# Patient Record
Sex: Female | Born: 1935 | Race: White | Hispanic: No | Smoking: Never smoker
Health system: Southern US, Community
[De-identification: ages and names within clinical notes are randomized; demographics above are authoritative.]

## PROBLEM LIST (undated history)

## (undated) DIAGNOSIS — E559 Vitamin D deficiency, unspecified: Secondary | ICD-10-CM

## (undated) DIAGNOSIS — I4891 Unspecified atrial fibrillation: Secondary | ICD-10-CM

## (undated) DIAGNOSIS — I10 Essential (primary) hypertension: Secondary | ICD-10-CM

## (undated) DIAGNOSIS — M810 Age-related osteoporosis without current pathological fracture: Secondary | ICD-10-CM

## (undated) DIAGNOSIS — H348192 Central retinal vein occlusion, unspecified eye, stable: Secondary | ICD-10-CM

## (undated) DIAGNOSIS — E119 Type 2 diabetes mellitus without complications: Secondary | ICD-10-CM

## (undated) HISTORY — DX: Central retinal vein occlusion, unspecified eye, stable: H34.8192

## (undated) HISTORY — DX: Unspecified atrial fibrillation: I48.91

## (undated) HISTORY — DX: Age-related osteoporosis without current pathological fracture: M81.0

---

## 2001-08-11 ENCOUNTER — Encounter: Admission: RE | Admit: 2001-08-11 | Discharge: 2001-08-11 | Payer: Self-pay | Admitting: Endocrinology

## 2001-08-11 ENCOUNTER — Encounter: Payer: Self-pay | Admitting: Endocrinology

## 2002-09-25 ENCOUNTER — Encounter: Admission: RE | Admit: 2002-09-25 | Discharge: 2002-09-25 | Payer: Self-pay | Admitting: Endocrinology

## 2002-09-25 ENCOUNTER — Encounter: Payer: Self-pay | Admitting: Endocrinology

## 2005-01-16 ENCOUNTER — Encounter: Admission: RE | Admit: 2005-01-16 | Discharge: 2005-01-16 | Payer: Self-pay | Admitting: Endocrinology

## 2005-10-09 ENCOUNTER — Ambulatory Visit: Payer: Self-pay | Admitting: Infectious Diseases

## 2009-12-30 ENCOUNTER — Encounter: Admission: RE | Admit: 2009-12-30 | Discharge: 2009-12-30 | Payer: Self-pay | Admitting: Endocrinology

## 2011-12-29 ENCOUNTER — Other Ambulatory Visit: Payer: Self-pay | Admitting: Endocrinology

## 2011-12-29 DIAGNOSIS — Z1231 Encounter for screening mammogram for malignant neoplasm of breast: Secondary | ICD-10-CM

## 2012-01-20 ENCOUNTER — Ambulatory Visit
Admission: RE | Admit: 2012-01-20 | Discharge: 2012-01-20 | Disposition: A | Discharge status: Home / Self Care | Payer: Medicare Other | Source: Ambulatory Visit | Attending: Endocrinology | Admitting: Endocrinology

## 2012-01-20 ENCOUNTER — Ambulatory Visit: Payer: Self-pay

## 2012-01-20 DIAGNOSIS — Z1231 Encounter for screening mammogram for malignant neoplasm of breast: Secondary | ICD-10-CM

## 2012-01-21 ENCOUNTER — Other Ambulatory Visit: Payer: Self-pay | Admitting: Endocrinology

## 2012-01-21 DIAGNOSIS — R928 Other abnormal and inconclusive findings on diagnostic imaging of breast: Secondary | ICD-10-CM

## 2012-01-29 ENCOUNTER — Ambulatory Visit
Admission: RE | Admit: 2012-01-29 | Discharge: 2012-01-29 | Disposition: A | Discharge status: Home / Self Care | Payer: Medicare Other | Source: Ambulatory Visit | Attending: Endocrinology | Admitting: Endocrinology

## 2012-01-29 DIAGNOSIS — R928 Other abnormal and inconclusive findings on diagnostic imaging of breast: Secondary | ICD-10-CM

## 2012-11-08 ENCOUNTER — Other Ambulatory Visit: Payer: Self-pay | Admitting: *Deleted

## 2012-11-08 ENCOUNTER — Encounter: Payer: Self-pay | Admitting: Endocrinology

## 2012-11-08 ENCOUNTER — Ambulatory Visit (INDEPENDENT_AMBULATORY_CARE_PROVIDER_SITE_OTHER): Payer: Medicare Other | Admitting: Endocrinology

## 2012-11-08 VITALS — BP 130/84 | HR 86 | Resp 12 | Ht 67.0 in | Wt 210.8 lb

## 2012-11-08 DIAGNOSIS — M1990 Unspecified osteoarthritis, unspecified site: Secondary | ICD-10-CM

## 2012-11-08 DIAGNOSIS — E559 Vitamin D deficiency, unspecified: Secondary | ICD-10-CM

## 2012-11-08 DIAGNOSIS — I10 Essential (primary) hypertension: Secondary | ICD-10-CM

## 2012-11-08 DIAGNOSIS — E119 Type 2 diabetes mellitus without complications: Secondary | ICD-10-CM

## 2012-11-08 DIAGNOSIS — I4891 Unspecified atrial fibrillation: Secondary | ICD-10-CM | POA: Insufficient documentation

## 2012-11-08 DIAGNOSIS — M810 Age-related osteoporosis without current pathological fracture: Secondary | ICD-10-CM

## 2012-11-08 DIAGNOSIS — E7800 Pure hypercholesterolemia, unspecified: Secondary | ICD-10-CM

## 2012-11-08 LAB — URINALYSIS, ROUTINE W REFLEX MICROSCOPIC

## 2012-11-08 LAB — COMPREHENSIVE METABOLIC PANEL
Alkaline Phosphatase: 45 U/L (ref 39–117)
BUN: 19 mg/dL (ref 6–23)
CO2: 29 mEq/L (ref 19–32)
Creatinine, Ser: 1.3 mg/dL — ABNORMAL HIGH (ref 0.4–1.2)
GFR: 43.34 mL/min — ABNORMAL LOW (ref >60.00)
Glucose, Bld: 200 mg/dL — ABNORMAL HIGH (ref 70–99)
Sodium: 137 mEq/L (ref 135–145)
Total Bilirubin: 0.7 mg/dL (ref 0.3–1.2)
Total Protein: 7.2 g/dL (ref 6.0–8.3)

## 2012-11-08 LAB — LIPID PANEL
Cholesterol: 222 mg/dL — ABNORMAL HIGH (ref 0–200)
Triglycerides: 226 mg/dL — ABNORMAL HIGH (ref 0.0–149.0)
VLDL: 45.2 mg/dL — ABNORMAL HIGH (ref 0.0–40.0)

## 2012-11-08 LAB — MICROALBUMIN / CREATININE URINE RATIO
Creatinine,U: 102 mg/dL
Microalb Creat Ratio: 1.9 mg/g (ref 0.0–30.0)
Microalb, Ur: 1.9 mg/dL (ref 0.0–1.9)

## 2012-11-08 MED ORDER — ATORVASTATIN CALCIUM 10 MG PO TABS: 10.0000 mg | ORAL_TABLET | Freq: Every day | ORAL | Status: DC

## 2012-11-08 MED ORDER — SITAGLIPTIN PHOS-METFORMIN HCL 50-1000 MG PO TABS: 1.0000 | ORAL_TABLET | Freq: Two times a day (BID) | ORAL | Status: DC

## 2012-11-08 MED ORDER — BISOPROLOL-HYDROCHLOROTHIAZIDE 10-6.25 MG PO TABS: 1.0000 | ORAL_TABLET | Freq: Every day | ORAL | Status: DC

## 2012-11-08 MED ORDER — GLUCOSE BLOOD VI STRP: ORAL_STRIP | Status: DC

## 2012-11-08 NOTE — Progress Notes (Signed)
Reason for Appointment: Diabetes follow-up   History of Present Illness   Diagnosis: Type 2 DIABETES MELITUS, date of diagnosis: 2002  The patient is seen today in follow up of Type 2 diabetes. This has been usually mild and well-controlled especially for her age.  She has however not been seen in followup since last December as she did not reschedule your canceled appointment in March  Her A1c was relatively increased on the last visit even with taking full dose Janumet and Amaryl. However she has not been monitoring her blood sugar at home lately and does not know what it is She did not feel tired or have any increased thirst or urination She said that she has been out of her Janumet although has been taking her Amaryl as directed The last HbgA1c was 7.2   Difficulties with medication adherence: Running out of medication .  Monitors blood glucose: Less than once a day.  Glucometer: Accucheck.  Blood Glucose readings: Unknown  Hypoglycemia frequency: Never.  Food preferences: Avoiding sweets, cereal and large portions, egg in am.   Physical activity: at home: walking 10 minutes up to 4 days a week  The microalbumin has been tested , and the result is normal.  Diabetes complications: None  Hypertension:   Here for hypertension F/U doing well and without complaints . She is on  10 mg Ziac and 5 mg lisinopril  Did have a higher creatinine with 10 mg lisinopril    Medication List       This list is accurate as of: 11/08/12 11:59 PM.  Always use your most recent med list.               ACCU-CHEK FASTCLIX LANCETS Misc  by Does not apply route. Check blood sugars once a day     atorvastatin 10 MG tablet  Commonly known as:  LIPITOR  Take 1 tablet (10 mg total) by mouth daily.     bisoprolol-hydrochlorothiazide 10-6.25 MG per tablet  Commonly known as:  ZIAC  Take 1 tablet by mouth daily.     glimepiride 2 MG tablet  Commonly known as:  AMARYL  Take 2 mg by mouth daily  before breakfast.     glucose blood test strip  Commonly known as:  ACCU-CHEK SMARTVIEW  Use as instructed     lisinopril 5 MG tablet  Commonly known as:  PRINIVIL,ZESTRIL  Take 5 mg by mouth daily.     raloxifene 60 MG tablet  Commonly known as:  EVISTA  Take 60 mg by mouth daily.     sitaGLIPtan-metformin 50-1000 MG per tablet  Commonly known as:  JANUMET  Take 1 tablet by mouth 2 (two) times daily with a meal.     Vitamin D (Ergocalciferol) 50000 UNITS Caps  Commonly known as:  DRISDOL  Take 50,000 Units by mouth every 7 (seven) days.     warfarin 5 MG tablet  Commonly known as:  COUMADIN  Take 5 mg by mouth daily.        Allergies: No Known Allergies  Past Medical History  Diagnosis Date  . Retinal vein occlusion   . Atrial fibrillation   . Osteoporosis     No past surgical history on file.  Family History  Problem Relation Age of Onset  . Heart disease Brother     Social History:  reports that she has never smoked. She does not have any smokeless tobacco history on file. Her alcohol and drug histories are not  on file.  Review of Systems - Cardiovascular ROS: positive for - atrial fibrillation and hypertension  The patient has a history of Osteoporosis and her last T score at the femoral neck was -3.1 in 9/11 compared to 2.5 in 2006. T score of the spine was -2.5 compared to previous one of 3.3. She had been on bisphosphonates including 2 doses of Reclast infusion. May have had transient increase in creatinine with last Reclast and this was stopped. Was started on Evista in 12/12 and she is tolerating this well.   Examination:   BP 130/84  Pulse 86  Resp 12  Ht 5\' 7"  (1.702 m)  Wt 210 lb 12.8 oz (95.618 kg)  BMI 33.01 kg/m2  SpO2 99%  Body mass index is 33.01 kg/(m^2).   Heart rate 80 appears regular and heart sounds are normal Lungs clear No pedal edema  Assesment:   1. Diabetes type 2, uncontrolled - 250.02  The patient's diabetes will need  to be assessed by labs today Reminded her to check her blood sugars again to help monitor her blood sugars She will need to refill all her medications and be more compliant with these and also followup  2. Long-standing atrial fibrillation: Rate is controlled. She will need to be set up with the Coumadin clinic for regular followup. To have EKG on the next visit  3. Blood pressure appears to be well controlled on second measurement  4. History of mild CKD: This will need to be followed  5. History of osteoporosis: She will continue Evista  6. History of hypercholesterolemia, need to make sure she is compliant with her medication and check lab work  7. History of vitamin D deficiency: She will continue supplements and will have vitamin D level done when Medicare allows this, currently not permissible  Susan Potter 11/11/2012, 9:33 AM   Office Visit on 11/08/2012  Component Date Value Range Status  . Color, Urine 11/08/2012 LT. YELLOW  Yellow;Lt. Yellow Final  . APPearance 11/08/2012 CLEAR  Clear Final  . Specific Gravity, Urine 11/08/2012 1.025  1.000-1.030 Final  . pH 11/08/2012 6.0  5.0 - 8.0 Final  . Total Protein, Urine 11/08/2012 NEGATIVE  Negative Final  . Urine Glucose 11/08/2012 NEGATIVE  Negative Final  . Ketones, ur 11/08/2012 NEGATIVE  Negative Final  . Bilirubin Urine 11/08/2012 NEGATIVE  Negative Final  . Hgb urine dipstick 11/08/2012 TRACE-INTACT  Negative Final  . Urobilinogen, UA 11/08/2012 0.2  0.0 - 1.0 Final  . Leukocytes, UA 11/08/2012 NEGATIVE  Negative Final  . Nitrite 11/08/2012 NEGATIVE  Negative Final  . WBC, UA 11/08/2012 3-6/hpf  0-2/hpf Final  . RBC / HPF 11/08/2012 0-2/hpf  0-2/hpf Final  . Squamous Epithelial / LPF 11/08/2012 Few(5-10/hpf)  Rare(0-4/hpf) Final  . Bacteria, UA 11/08/2012 Rare(<10/hpf)  None Final  . Microalb, Ur 11/08/2012 1.9  0.0 - 1.9 mg/dL Final  . Creatinine,U 40/98/1191 102.0   Final  . Microalb Creat Ratio 11/08/2012 1.9  0.0 -  30.0 mg/g Final  . Hemoglobin A1C 11/08/2012 9.3* 4.6 - 6.5 % Final   Glycemic Control Guidelines for People with Diabetes:Non Diabetic:  <6%Goal of Therapy: <7%Additional Action Suggested:  >8%   . TSH 11/08/2012 1.60  0.35 - 5.50 uIU/mL Final  . Sodium 11/08/2012 137  135 - 145 mEq/L Final  . Potassium 11/08/2012 4.1  3.5 - 5.1 mEq/L Final  . Chloride 11/08/2012 99  96 - 112 mEq/L Final  . CO2 11/08/2012 29  19 - 32 mEq/L  Final  . Glucose, Bld 11/08/2012 200* 70 - 99 mg/dL Final  . BUN 78/29/5621 19  6 - 23 mg/dL Final  . Creatinine, Ser 11/08/2012 1.3* 0.4 - 1.2 mg/dL Final  . Total Bilirubin 11/08/2012 0.7  0.3 - 1.2 mg/dL Final  . Alkaline Phosphatase 11/08/2012 45  39 - 117 U/L Final  . AST 11/08/2012 15  0 - 37 U/L Final  . ALT 11/08/2012 9  0 - 35 U/L Final  . Total Protein 11/08/2012 7.2  6.0 - 8.3 g/dL Final  . Albumin 30/86/5784 3.6  3.5 - 5.2 g/dL Final  . Calcium 69/62/9528 9.4  8.4 - 10.5 mg/dL Final  . GFR 41/32/4401 43.34* >60.00 mL/min Final  . Cholesterol 11/08/2012 222* 0 - 200 mg/dL Final   ATP III Classification       Desirable:  < 200 mg/dL               Borderline High:  200 - 239 mg/dL          High:  > = 027 mg/dL  . Triglycerides 11/08/2012 226.0* 0.0 - 149.0 mg/dL Final   Normal:  <253 mg/dLBorderline High:  150 - 199 mg/dL  . HDL 11/08/2012 42.30  >39.00 mg/dL Final  . VLDL 66/44/0347 45.2* 0.0 - 40.0 mg/dL Final  . Total CHOL/HDL Ratio 11/08/2012 5   Final                  Men          Women1/2 Average Risk     3.4          3.3Average Risk          5.0          4.42X Average Risk          9.6          7.13X Average Risk          15.0          11.0                      . Direct LDL 11/08/2012 148.0   Final   Optimal:  <100 mg/dLNear or Above Optimal:  100-129 mg/dLBorderline High:  130-159 mg/dLHigh:  160-189 mg/dLVery High:  >190 mg/dL

## 2012-11-08 NOTE — Patient Instructions (Signed)
Restart glucose monitoring at least 3 times a week either before breakfast or 2-3 hours after any meal  Walk 10-15 minutes at least every other day  Please schedule appointment for prothrombin time lab work at the main office on [facility].

## 2012-11-10 ENCOUNTER — Telehealth: Payer: Self-pay | Admitting: *Deleted

## 2012-11-10 NOTE — Telephone Encounter (Signed)
Pt states she had ran out of the Janumet, a new order was sent in on the 22nd, she is aware of needing an appt in one month.

## 2012-11-10 NOTE — Telephone Encounter (Signed)
Please remind her to check her sugar at least once a day and bring monitor in one month

## 2012-11-10 NOTE — Telephone Encounter (Signed)
She was reminded of both

## 2012-11-11 ENCOUNTER — Encounter: Payer: Self-pay | Admitting: Endocrinology

## 2012-11-11 ENCOUNTER — Other Ambulatory Visit: Payer: Self-pay | Admitting: *Deleted

## 2012-11-11 MED ORDER — ONETOUCH ULTRA SYSTEM W/DEVICE KIT: PACK | Status: AC

## 2012-11-11 MED ORDER — GLUCOSE BLOOD VI STRP: ORAL_STRIP | Status: DC

## 2012-11-17 ENCOUNTER — Telehealth: Payer: Self-pay | Admitting: *Deleted

## 2012-11-17 NOTE — Telephone Encounter (Signed)
Noted pt is aware, she is taking medication as you wanted

## 2012-11-17 NOTE — Telephone Encounter (Signed)
Message copied by Hermenia Bers on Thu Nov 17, 2012 11:58 AM ------      Message from: [name]      Created: Wed Nov 09, 2012 11:50 AM       Her blood sugars are running very high, was 200 on the lab work, needs to check blood sugars at least once a day at different times.      Confirm that she is taking her Janumet twice a day. Need to have her take glimepiride 2 mg twice a day and see me back in one month ------

## 2012-12-16 ENCOUNTER — Telehealth: Payer: Self-pay | Admitting: Endocrinology

## 2012-12-16 MED ORDER — SITAGLIPTIN PHOS-METFORMIN HCL 50-1000 MG PO TABS: 1.0000 | ORAL_TABLET | Freq: Two times a day (BID) | ORAL | Status: DC

## 2012-12-16 NOTE — Telephone Encounter (Signed)
rx sent for 60 tablets, 30 day supply

## 2012-12-21 ENCOUNTER — Other Ambulatory Visit: Payer: Self-pay | Admitting: *Deleted

## 2012-12-21 MED ORDER — LISINOPRIL 5 MG PO TABS: 5.0000 mg | ORAL_TABLET | Freq: Every day | ORAL | Status: DC

## 2012-12-29 ENCOUNTER — Other Ambulatory Visit: Payer: Self-pay | Admitting: *Deleted

## 2012-12-29 MED ORDER — WARFARIN SODIUM 5 MG PO TABS: 5.0000 mg | ORAL_TABLET | Freq: Every day | ORAL | Status: DC

## 2012-12-31 ENCOUNTER — Emergency Department (HOSPITAL_COMMUNITY): Payer: Medicare Other

## 2012-12-31 ENCOUNTER — Emergency Department (HOSPITAL_COMMUNITY)
Admission: EM | Admit: 2012-12-31 | Discharge: 2012-12-31 | Disposition: A | Discharge status: Home / Self Care | Payer: Medicare Other | Attending: Emergency Medicine | Admitting: Emergency Medicine

## 2012-12-31 ENCOUNTER — Encounter (HOSPITAL_COMMUNITY): Payer: Self-pay | Admitting: Emergency Medicine

## 2012-12-31 DIAGNOSIS — S0180XA Unspecified open wound of other part of head, initial encounter: Secondary | ICD-10-CM | POA: Insufficient documentation

## 2012-12-31 DIAGNOSIS — Z7901 Long term (current) use of anticoagulants: Secondary | ICD-10-CM | POA: Insufficient documentation

## 2012-12-31 DIAGNOSIS — Y9389 Activity, other specified: Secondary | ICD-10-CM | POA: Insufficient documentation

## 2012-12-31 DIAGNOSIS — I10 Essential (primary) hypertension: Secondary | ICD-10-CM | POA: Insufficient documentation

## 2012-12-31 DIAGNOSIS — I4891 Unspecified atrial fibrillation: Secondary | ICD-10-CM | POA: Insufficient documentation

## 2012-12-31 DIAGNOSIS — E119 Type 2 diabetes mellitus without complications: Secondary | ICD-10-CM | POA: Insufficient documentation

## 2012-12-31 DIAGNOSIS — Y929 Unspecified place or not applicable: Secondary | ICD-10-CM | POA: Insufficient documentation

## 2012-12-31 DIAGNOSIS — M810 Age-related osteoporosis without current pathological fracture: Secondary | ICD-10-CM | POA: Insufficient documentation

## 2012-12-31 DIAGNOSIS — S42302A Unspecified fracture of shaft of humerus, left arm, initial encounter for closed fracture: Secondary | ICD-10-CM

## 2012-12-31 DIAGNOSIS — Z79899 Other long term (current) drug therapy: Secondary | ICD-10-CM | POA: Insufficient documentation

## 2012-12-31 DIAGNOSIS — Z8669 Personal history of other diseases of the nervous system and sense organs: Secondary | ICD-10-CM | POA: Insufficient documentation

## 2012-12-31 DIAGNOSIS — S42213A Unspecified displaced fracture of surgical neck of unspecified humerus, initial encounter for closed fracture: Secondary | ICD-10-CM | POA: Insufficient documentation

## 2012-12-31 DIAGNOSIS — W010XXA Fall on same level from slipping, tripping and stumbling without subsequent striking against object, initial encounter: Secondary | ICD-10-CM | POA: Insufficient documentation

## 2012-12-31 DIAGNOSIS — W01119A Fall on same level from slipping, tripping and stumbling with subsequent striking against unspecified sharp object, initial encounter: Secondary | ICD-10-CM | POA: Insufficient documentation

## 2012-12-31 HISTORY — DX: Type 2 diabetes mellitus without complications: E11.9

## 2012-12-31 HISTORY — DX: Essential (primary) hypertension: I10

## 2012-12-31 MED ORDER — OXYCODONE-ACETAMINOPHEN 5-325 MG PO TABS: 2.0000 | ORAL_TABLET | ORAL | Status: DC | PRN

## 2012-12-31 MED ORDER — HYDROCODONE-ACETAMINOPHEN 5-325 MG PO TABS
2.0000 | ORAL_TABLET | Freq: Once | ORAL | Status: AC
  Administered 2012-12-31: 2 via ORAL
  Filled 2012-12-31: qty 2

## 2012-12-31 MED ORDER — POLYETHYLENE GLYCOL 3350 17 GM/SCOOP PO POWD: 17.0000 g | Freq: Every day | ORAL | Status: DC

## 2012-12-31 MED ORDER — MORPHINE SULFATE 4 MG/ML IJ SOLN
4.0000 mg | Freq: Once | INTRAMUSCULAR | Status: AC
  Administered 2012-12-31: 4 mg via INTRAMUSCULAR
  Filled 2012-12-31: qty 1

## 2012-12-31 NOTE — ED Provider Notes (Signed)
CSN: [id number]     Arrival date & time 12/31/12  1907 History   First MD Initiated Contact with Patient 12/31/12 1933     Chief Complaint  Patient presents with  . Fall   (Consider location/radiation/quality/duration/timing/severity/associated sxs/prior Treatment) HPI Comments: Pt missed a step down, fell onto left side and injured left shoulder upon impact. Has a small cut on left eyebrow and reports was due to glasses.  Insists she did not hit her head, no neck pain, no LOC, no HA.  She has no back pain, left hip pain, hurts shoulder to move far.  Pt is right handed. Pt is on coumadin.  No sig bleeding.  No abd pain, flank pain.  No meds taken PTA.    Patient is a 79 y.o. female presenting with shoulder injury. The history is provided by the patient and a relative.  Shoulder Injury This is a new problem. The current episode started 1 to 2 hours ago. Pertinent negatives include no abdominal pain and no headaches. The symptoms are aggravated by twisting. The symptoms are relieved by rest. She has tried nothing for the symptoms. The treatment provided no relief.    Past Medical History  Diagnosis Date  . Retinal vein occlusion   . Atrial fibrillation   . Osteoporosis   . Hypertension   . Diabetes mellitus without complication    History reviewed. No pertinent past surgical history. Family History  Problem Relation Age of Onset  . Heart disease Brother    History  Substance Use Topics  . Smoking status: Never Smoker   . Smokeless tobacco: Not on file  . Alcohol Use: No   OB History   Grav Para Term Preterm Abortions TAB SAB Ect Mult Living                 Review of Systems  Constitutional: Negative for diaphoresis.  HENT: Negative for neck pain.   Gastrointestinal: Negative for nausea, vomiting and abdominal pain.  Musculoskeletal: Positive for arthralgias. Negative for back pain.  Skin: Positive for wound.  Neurological: Negative for dizziness, syncope, light-headedness  and headaches.    Allergies  Review of patient's allergies indicates no known allergies.  Home Medications   Current Outpatient Rx  Name  Route  Sig  Dispense  Refill  . bisoprolol-hydrochlorothiazide (ZIAC) 10-6.25 MG per tablet   Oral   Take 1 tablet by mouth daily.   30 tablet   5   . glimepiride (AMARYL) 2 MG tablet   Oral   Take 1 mg by mouth at bedtime.          Marland Kitchen lisinopril (PRINIVIL,ZESTRIL) 5 MG tablet   Oral   Take 1 tablet (5 mg total) by mouth daily.   30 tablet   6   . OVER THE COUNTER MEDICATION   Both Eyes   Place 1 drop into both eyes daily as needed (dry eyes/itching).         . raloxifene (EVISTA) 60 MG tablet   Oral   Take 60 mg by mouth daily.         . sitaGLIPtin-metformin (JANUMET) 50-1000 MG per tablet   Oral   Take 1 tablet by mouth 2 (two) times daily with a meal.   60 tablet   5   . Vitamin D, Ergocalciferol, (DRISDOL) 50000 UNITS CAPS   Oral   Take 50,000 Units by mouth every 7 (seven) days. On Sundays         . warfarin (  COUMADIN) 5 MG tablet   Oral   Take 1 tablet (5 mg total) by mouth daily.   30 tablet   5   . ACCU-CHEK FASTCLIX LANCETS MISC   Does not apply   by Does not apply route. Check blood sugars once a day         . Blood Glucose Monitoring Suppl (ONE TOUCH ULTRA SYSTEM KIT) W/DEVICE KIT      One touch Ultra 2 Glucometer   1 each   0   . glucose blood (ACCU-CHEK SMARTVIEW) test strip      Use as instructed   100 each   5   . glucose blood test strip      One touch Ultra 2 test strips only   100 each   12   . oxyCODONE-acetaminophen (PERCOCET) 5-325 MG per tablet   Oral   Take 2 tablets by mouth every 4 (four) hours as needed for pain.   20 tablet   0   . polyethylene glycol powder (GLYCOLAX/MIRALAX) powder   Oral   Take 17 g by mouth daily.   255 g   0    BP 122/74  Pulse 78  Temp(Src) 97.4 F (36.3 C) (Oral)  Resp 16  SpO2 97% Physical Exam  Nursing note and vitals  reviewed. Constitutional: She is oriented to person, place, and time. She appears well-developed and well-nourished. No distress.  HENT:  Head: Normocephalic.  Eyes: Conjunctivae and EOM are normal. Pupils are equal, round, and reactive to light.    Neck: Trachea normal, normal range of motion and full passive range of motion without pain. Neck supple. No spinous process tenderness and no muscular tenderness present. No tracheal deviation and normal range of motion present. No Brudzinski's sign and no Kernig's sign noted.  Cardiovascular: Normal rate and intact distal pulses.   Pulmonary/Chest: Effort normal and breath sounds normal. No stridor. No respiratory distress.  Abdominal: Soft. She exhibits no distension. There is no tenderness. There is no rebound.  Musculoskeletal:       Left shoulder: She exhibits decreased range of motion, tenderness, crepitus and pain. She exhibits normal pulse and normal strength.       Left elbow: She exhibits no deformity. No tenderness found.       Left wrist: She exhibits normal range of motion and no tenderness.       Right hip: She exhibits no tenderness.       Left hip: She exhibits no tenderness.       Cervical back: She exhibits no tenderness.       Thoracic back: She exhibits no tenderness.  Neurological: She is alert and oriented to person, place, and time. She exhibits normal muscle tone. Coordination normal.  Skin: Skin is warm. She is not diaphoretic.  Psychiatric: She has a normal mood and affect.    ED Course  Procedures (including critical care time) Labs Review Labs Reviewed - No data to display Imaging Review No results found.  ra sat is 97% and I interpret to be normal  8:00 PM Pt is somewhat more comfortable after PO pain meds.  Plain films pending.  If fracture present, it is closed, likely amenable for splint and sling, referral to orthopedist for close outpt follow up and Rx for pain medication and return instructions.     MDM   1. Humerus fracture, left, closed, initial encounter      Pt with mechanical fall.  Pt offered head CT given  small wound to left foreahead, she insists she didn't hit head, no HA.  Pt with left shoulder crepitus, pain.  Normal distal color, pulses, sensation of left UE.  Will get plain films of left shoulder, PO vicodin for pain, ice.      Gavin Pound. Oletta Lamas, MD 01/02/13 2234

## 2012-12-31 NOTE — ED Notes (Signed)
Pt. missed her step and tripped this evening , fell forward ,no LOC / ambulatory reports pain at left shoulder and abrasion at left eyebrow , alert and oriented , respirations unlabored.

## 2012-12-31 NOTE — ED Provider Notes (Signed)
Susan Potter S 8:00 PM patient discussed in sign out with Dr. Oletta Lamas.  The patient is an 79 year old woman with history of A. fib on Coumadin who had a mechanical fall injuring her left arm and shoulder area. She had a small superficial laceration above her left eyebrow from her glasses. No significant head trauma. She has refused CT of the head however has normal nonfocal neuro exam and good mentation. X-rays of left shoulder and humerus pending.   8:45PM X-rays reviewed and demonstrate a mildly displaced left humeral neck fracture. I discussed the case and reviewed the x-rays with Dr. Ranell Patrick on call for orthopedics. Recommend placing the patient in a sling immobilizer and having her followup on Monday in the office. We'll give prescriptions for Percocet for pain as well as stool softener to prevent constipation.  Patient and family notified of findings and treatment plan. They are in agreement and will followup. Patient was given a strict return precautions.  Angus Seller, PA-C 12/31/12 2110

## 2012-12-31 NOTE — Progress Notes (Signed)
Orthopedic Tech Progress Note Patient Details:  Susan Potter Oct 27, 1926 [id number]  Ortho Devices Type of Ortho Device: Sling immobilizer Ortho Device/Splint Location: lue Ortho Device/Splint Interventions: Application   Kenzo Ozment 12/31/2012, 9:03 PM

## 2013-01-01 NOTE — ED Provider Notes (Signed)
Medical screening examination/treatment/procedure(s) were performed by non-physician practitioner and as supervising physician I was immediately available for consultation/collaboration.   Candyce Churn, MD 01/01/13 1105

## 2013-01-03 ENCOUNTER — Telehealth: Payer: Self-pay | Admitting: *Deleted

## 2013-01-03 MED ORDER — OXYCODONE-ACETAMINOPHEN 5-325 MG PO TABS: 2.0000 | ORAL_TABLET | ORAL | Status: DC | PRN

## 2013-01-03 NOTE — Telephone Encounter (Signed)
We can drink a prescription for 25 tablets of Percocet 5/325 Also needs to be scheduled for Coumadin monitoring

## 2013-01-03 NOTE — Telephone Encounter (Signed)
Pt fell on Saturday and broke her arm, the ER doctor gave her percocet, but she has ran out and the orthopedic Dr. Gunnar Bulla call her in any because she hasn't been seen, Daughter [name]) is wanting to see if you will okay it to be called in?

## 2013-02-07 ENCOUNTER — Telehealth: Payer: Self-pay | Admitting: Endocrinology

## 2013-02-07 NOTE — Telephone Encounter (Signed)
Does pt need to r/s follow up or are same day labs OK?  / Sherri

## 2013-02-07 NOTE — Telephone Encounter (Signed)
Same day labs are okay

## 2013-02-07 NOTE — Telephone Encounter (Signed)
Pt has appt tomorrow. Looks like last visit was in July 2014. It was not requested in July to schedule a lab appt but it does look like patient has future labs on order from Dr. Lucianne Muss. Does pt need labs prior to her follow up appt w/ Dr. Lucianne Muss. She is currently on his schedule for tomorrow morning. Please advise-Sherri

## 2013-02-07 NOTE — Telephone Encounter (Signed)
Yes patient will need labs, thanks

## 2013-02-08 ENCOUNTER — Encounter: Payer: Self-pay | Admitting: Endocrinology

## 2013-02-08 ENCOUNTER — Other Ambulatory Visit (INDEPENDENT_AMBULATORY_CARE_PROVIDER_SITE_OTHER): Payer: Medicare Other

## 2013-02-08 ENCOUNTER — Ambulatory Visit (INDEPENDENT_AMBULATORY_CARE_PROVIDER_SITE_OTHER): Payer: Medicare Other | Admitting: Endocrinology

## 2013-02-08 VITALS — BP 118/74 | HR 85 | Temp 98.6°F | Resp 12 | Ht 67.0 in | Wt 208.3 lb

## 2013-02-08 DIAGNOSIS — Z7901 Long term (current) use of anticoagulants: Secondary | ICD-10-CM

## 2013-02-08 DIAGNOSIS — I4891 Unspecified atrial fibrillation: Secondary | ICD-10-CM

## 2013-02-08 DIAGNOSIS — E119 Type 2 diabetes mellitus without complications: Secondary | ICD-10-CM

## 2013-02-08 DIAGNOSIS — E7800 Pure hypercholesterolemia, unspecified: Secondary | ICD-10-CM

## 2013-02-08 DIAGNOSIS — I10 Essential (primary) hypertension: Secondary | ICD-10-CM

## 2013-02-08 LAB — BASIC METABOLIC PANEL
Chloride: 103 mEq/L (ref 96–112)
Potassium: 4.6 mEq/L (ref 3.5–5.1)

## 2013-02-08 LAB — PROTIME-INR: Prothrombin Time: 45.5 s — ABNORMAL HIGH (ref 10.2–12.4)

## 2013-02-08 LAB — LIPID PANEL
Cholesterol: 124 mg/dL (ref 0–200)
HDL: 49.9 mg/dL (ref >39.00)
VLDL: 24 mg/dL (ref 0.0–40.0)

## 2013-02-08 LAB — HEMOGLOBIN A1C: Hgb A1c MFr Bld: 7.1 % — ABNORMAL HIGH (ref 4.6–6.5)

## 2013-02-08 NOTE — Progress Notes (Signed)
Susan Potter  Reason for Appointment: Diabetes follow-up   History of Present Illness   Diagnosis: Type 2 DIABETES MELITUS, date of diagnosis: 2002  She is here for follow up of Type 2 diabetes. This has been usually mild and well-controlled especially for her age.  A1c last December was 7.2 She was however having rather poor control of her last visit because of running out of her Janumet Also had been quite noncompliant with checking her blood sugar Recently also she has not checked her blood sugar much at all and does not appear motivated to do so  Monitors blood glucose very rarely  Glucometer: Accucheck.  Blood Glucose readings: Previously <150 Hypoglycemia frequency: Never.  Food preferences: Avoiding sweets, cereal and large portions, egg in am. Side effects from medications: None   Physical activity: at home: walking 10 minutes up to 4 days a week  The microalbumin has been previously normal Diabetes complications: None  Hypertension:    Her blood pressure is excellent now. She is on 10 mg Ziac and 5 mg lisinopril  Did have a higher creatinine with 10 mg lisinopril  ANTICOAGULATION: For some reason she has not been followed in the Coumadin clinic and has not had her prothrombin time checked in several months. She continues to take Coumadin 5 mg daily. She has had long-standing atrial fibrillation without symptoms  HYPERLIPIDEMIA: On her last visit she had run out of her Lipitor and LDL was 148, she thinks she has been back on it now     Medication List       This list is accurate as of: 02/08/13 11:59 PM.  Always use your most recent med list.               ACCU-CHEK FASTCLIX LANCETS Misc  by Does not apply route. Check blood sugars once a day     bisoprolol-hydrochlorothiazide 10-6.25 MG per tablet  Commonly known as:  ZIAC  Take 1 tablet by mouth daily.     glimepiride 2 MG tablet  Commonly known as:  AMARYL  Take 1 mg by mouth at bedtime.     glucose blood test strip  Commonly known as:  ACCU-CHEK SMARTVIEW  Use as instructed     glucose blood test strip  One touch Ultra 2 test strips only     lisinopril 5 MG tablet  Commonly known as:  PRINIVIL,ZESTRIL  Take 1 tablet (5 mg total) by mouth daily.     ONE TOUCH ULTRA SYSTEM KIT W/DEVICE Kit  One touch Ultra 2 Glucometer     OVER THE COUNTER MEDICATION  Place 1 drop into both eyes daily as needed (dry eyes/itching).     polyethylene glycol powder powder  Commonly known as:  GLYCOLAX/MIRALAX  Take 17 g by mouth daily.     raloxifene 60 MG tablet  Commonly known as:  EVISTA  Take 60 mg by mouth daily.     sitaGLIPtin-metformin 50-1000 MG per tablet  Commonly known as:  JANUMET  Take 1 tablet by mouth 2 (two) times daily with a meal.     Vitamin D (Ergocalciferol) 50000 UNITS Caps capsule  Commonly known as:  DRISDOL  Take 50,000 Units by mouth every 7 (seven) days. On Sundays     warfarin 5 MG tablet  Commonly known as:  COUMADIN  Take 1 tablet (5 mg total) by mouth daily.        Allergies: No Known Allergies  Past Medical History  Diagnosis Date  .  Retinal vein occlusion   . Atrial fibrillation   . Osteoporosis   . Hypertension   . Diabetes mellitus without complication     No past surgical history on file.  Family History  Problem Relation Age of Onset  . Heart disease Brother     Social History:  reports that she has never smoked. She does not have any smokeless tobacco history on file. She reports that she does not drink alcohol. Her drug history is not on file.  Review of Systems - Cardiovascular ROS: positive for - atrial fibrillation and hypertension  Osteoporosis: her last T score at the femoral neck was -3.1 in 9/11 compared to 2.5 in 2006. T score of the spine was -2.5 compared to previous one of 3.3. She had been on bisphosphonates including 2 doses of Reclast infusion. May have had transient increase in creatinine with last Reclast and  this was stopped. Was started on Evista in 12/12 and she is tolerating this well. She did have left arm fracture recently but this was from a significant fall, still wearing a sling  No history of thyroid disease  She does not think she has forgetfulness  No history of numbness or tingling in her feet, no history of balance difficulty   Examination:   BP 118/74  Pulse 85  Temp(Src) 98.6 F (37 C)  Resp 12  Ht 5\' 7"  (1.702 m)  Wt 208 lb 4.8 oz (94.484 kg)  BMI 32.62 kg/m2  SpO2 94%  Body mass index is 32.62 kg/(m^2).   Heart rate 76 appears regular and heart sounds are normal No pedal edema  Assesment:   1. Diabetes type 2, uncontrolled - 250.02  The patient's diabetes will need to be assessed by labs today Reminded her to check her blood sugars regularly to help monitor her blood sugars  2. Long-standing atrial fibrillation: Rate is controlled. She will need to be set up with the Coumadin clinic for regular followup. To have EKG on the next visit  3. Blood pressure appears to be well controlled and she will continue same medications  4. History of mild CKD: This will need to be followed  5. History of osteoporosis: to continue Evista, consider followup bone density  6. History of hypercholesterolemia, followup lipids to be done  7. History of vitamin D deficiency: She will continue supplements and will have vitamin D level done annually as allowed by Medicare   [facility] 02/09/2013, 12:08 PM   Appointment on 02/08/2013  Component Date Value Range Status  . Sodium 02/08/2013 138  135 - 145 mEq/L Final  . Potassium 02/08/2013 4.6  3.5 - 5.1 mEq/L Final  . Chloride 02/08/2013 103  96 - 112 mEq/L Final  . CO2 02/08/2013 26  19 - 32 mEq/L Final  . Glucose, Bld 02/08/2013 138* 70 - 99 mg/dL Final  . BUN 16/01/9603 16  6 - 23 mg/dL Final  . Creatinine, Ser 02/08/2013 1.3* 0.4 - 1.2 mg/dL Final  . Calcium 54/12/8117 9.5  8.4 - 10.5 mg/dL Final  . GFR 14/78/2956  41.77* >60.00 mL/min Final  . Hemoglobin A1C 02/08/2013 7.1* 4.6 - 6.5 % Final   Glycemic Control Guidelines for People with Diabetes:Non Diabetic:  <6%Goal of Therapy: <7%Additional Action Suggested:  >8%   . Cholesterol 02/08/2013 124  0 - 200 mg/dL Final   ATP III Classification       Desirable:  < 200 mg/dL  Borderline High:  200 - 239 mg/dL          High:  > = 295 mg/dL  . Triglycerides 02/08/2013 120.0  0.0 - 149.0 mg/dL Final   Normal:  <284 mg/dLBorderline High:  150 - 199 mg/dL  . HDL 02/08/2013 49.90  >39.00 mg/dL Final  . VLDL 13/24/4010 24.0  0.0 - 40.0 mg/dL Final  . LDL Cholesterol 02/08/2013 50  0 - 99 mg/dL Final  . Total CHOL/HDL Ratio 02/08/2013 2   Final                  Men          Women1/2 Average Risk     3.4          3.3Average Risk          5.0          4.42X Average Risk          9.6          7.13X Average Risk          15.0          11.0                      . TSH 02/08/2013 3.84  0.35 - 5.50 uIU/mL Final  . Prothrombin Time 02/08/2013 45.5* 10.2 - 12.4 sec Final  . INR 02/08/2013 4.4* 0.8 - 1.0 ratio Final

## 2013-02-08 NOTE — Patient Instructions (Signed)
Please check blood sugars at least half the time about 2 hours after any meal and as directed on waking up. Please bring blood sugar monitor to each visit  Check dose of atorvastatin

## 2013-02-15 ENCOUNTER — Telehealth: Payer: Self-pay | Admitting: *Deleted

## 2013-02-15 NOTE — Telephone Encounter (Signed)
Message copied by Baldwin [place] on Wed Feb 15, 2013  4:38 PM ------      Message from: [name]      Created: Fri Feb 10, 2013 12:48 PM       Please make sure she is scheduled for followup prothrombin time and Coumadin management at [facility] office ------

## 2013-02-15 NOTE — Telephone Encounter (Signed)
Patient has appt for PT check at [facility] office on 02/17/13.

## 2013-02-17 ENCOUNTER — Ambulatory Visit (INDEPENDENT_AMBULATORY_CARE_PROVIDER_SITE_OTHER): Payer: Medicare Other | Admitting: General Practice

## 2013-02-17 DIAGNOSIS — Z7901 Long term (current) use of anticoagulants: Secondary | ICD-10-CM

## 2013-02-17 DIAGNOSIS — I4891 Unspecified atrial fibrillation: Secondary | ICD-10-CM

## 2013-02-23 ENCOUNTER — Other Ambulatory Visit: Payer: Self-pay

## 2013-03-03 ENCOUNTER — Ambulatory Visit (INDEPENDENT_AMBULATORY_CARE_PROVIDER_SITE_OTHER): Payer: Medicare Other | Admitting: General Practice

## 2013-03-03 DIAGNOSIS — I4891 Unspecified atrial fibrillation: Secondary | ICD-10-CM

## 2013-03-03 DIAGNOSIS — Z7901 Long term (current) use of anticoagulants: Secondary | ICD-10-CM

## 2013-03-03 LAB — POCT INR: INR: 2

## 2013-03-03 NOTE — Progress Notes (Signed)
Pre-visit discussion using our clinic review tool. No additional management support is needed unless otherwise documented below in the visit note.  

## 2013-03-09 ENCOUNTER — Other Ambulatory Visit (INDEPENDENT_AMBULATORY_CARE_PROVIDER_SITE_OTHER): Payer: Medicare Other

## 2013-03-09 LAB — BASIC METABOLIC PANEL
CO2: 27 mEq/L (ref 19–32)
Calcium: 9.5 mg/dL (ref 8.4–10.5)
Chloride: 103 mEq/L (ref 96–112)
Sodium: 139 mEq/L (ref 135–145)

## 2013-03-09 LAB — FRUCTOSAMINE: Fructosamine: 243 umol/L (ref <285)

## 2013-03-14 ENCOUNTER — Encounter: Payer: Self-pay | Admitting: Endocrinology

## 2013-03-14 ENCOUNTER — Ambulatory Visit (INDEPENDENT_AMBULATORY_CARE_PROVIDER_SITE_OTHER): Payer: Medicare Other | Admitting: Endocrinology

## 2013-03-14 VITALS — BP 112/70 | HR 77 | Temp 98.2°F | Resp 10 | Ht 67.0 in | Wt 208.7 lb

## 2013-03-14 DIAGNOSIS — I4891 Unspecified atrial fibrillation: Secondary | ICD-10-CM

## 2013-03-14 DIAGNOSIS — IMO0001 Reserved for inherently not codable concepts without codable children: Secondary | ICD-10-CM

## 2013-03-14 DIAGNOSIS — I10 Essential (primary) hypertension: Secondary | ICD-10-CM

## 2013-03-14 DIAGNOSIS — M810 Age-related osteoporosis without current pathological fracture: Secondary | ICD-10-CM

## 2013-03-14 DIAGNOSIS — Z23 Encounter for immunization: Secondary | ICD-10-CM

## 2013-03-14 NOTE — Patient Instructions (Signed)
Please check blood sugars at least half the time about 2 hours after any meal and as directed on waking up. Please bring blood sugar monitor to each visit  Walk 20 min daily

## 2013-03-14 NOTE — Progress Notes (Signed)
Susan Potter  Reason for Appointment: Diabetes follow-up   History of Present Illness   Diagnosis: Type 2 DIABETES MELITUS, date of diagnosis: 2002  She is here for follow up of Type 2 diabetes. This has been usually mild and well-controlled especially for her age.  A1c last December was 7.2 She was however having rather poor control on her last visit because of running out of her Janumet Also had been quite noncompliant with checking her blood sugar  With restarting her medications her glucose appears to be better controlled as judged by fructosamine No hypoglycemia with low-dose Amaryl  Monitors blood glucose very rarely  Glucometer: Accucheck.  Blood Glucose readings: Previously <150 Hypoglycemia frequency: Never.  Food preferences: Avoiding sweets, cereal and large portions, egg in am. Side effects from medications: None  Physical activity: a little walking  The microalbumin has been previously normal Diabetes complications: None  Hypertension:    Her blood pressure is controlled now. She is on 10 mg Ziac and 5 mg lisinopril  Did have a higher creatinine with 10 mg lisinopril, now normal  ANTICOAGULATION: Currently followed in Coumadin clinic She has had long-standing atrial fibrillation without symptoms  HYPERLIPIDEMIA: On her last visit she had run out of her Lipitor and LDL was 148, she has been taking this regularly now Lab Results  Component Value Date   CHOL 124 02/08/2013   HDL 49.90 02/08/2013   LDLCALC 50 02/08/2013   LDLDIRECT 148.0 11/08/2012   TRIG 120.0 02/08/2013   CHOLHDL 2 02/08/2013        Medication List       This list is accurate as of: 03/14/13  8:34 AM.  Always use your most recent med list.               ACCU-CHEK FASTCLIX LANCETS Misc  by Does not apply route. Check blood sugars once a day     atorvastatin 10 MG tablet  Commonly known as:  LIPITOR  Take 10 mg by mouth daily.     bisoprolol-hydrochlorothiazide 10-6.25 MG per  tablet  Commonly known as:  ZIAC  Take 1 tablet by mouth daily.     glimepiride 2 MG tablet  Commonly known as:  AMARYL  Take 1 mg by mouth at bedtime.     glucose blood test strip  Commonly known as:  ACCU-CHEK SMARTVIEW  Use as instructed     glucose blood test strip  One touch Ultra 2 test strips only     lisinopril 5 MG tablet  Commonly known as:  PRINIVIL,ZESTRIL  Take 1 tablet (5 mg total) by mouth daily.     ONE TOUCH ULTRA SYSTEM KIT W/DEVICE Kit  One touch Ultra 2 Glucometer     OVER THE COUNTER MEDICATION  Place 1 drop into both eyes daily as needed (dry eyes/itching).     polyethylene glycol powder powder  Commonly known as:  GLYCOLAX/MIRALAX  Take 17 g by mouth daily.     raloxifene 60 MG tablet  Commonly known as:  EVISTA  Take 60 mg by mouth daily.     sitaGLIPtin-metformin 50-1000 MG per tablet  Commonly known as:  JANUMET  Take 1 tablet by mouth 2 (two) times daily with a meal.     Vitamin D (Ergocalciferol) 50000 UNITS Caps capsule  Commonly known as:  DRISDOL  Take 50,000 Units by mouth every 7 (seven) days. On Sundays     warfarin 5 MG tablet  Commonly known as:  COUMADIN  Take 1 tablet (5 mg total) by mouth daily.        Allergies: No Known Allergies  Past Medical History  Diagnosis Date  . Retinal vein occlusion   . Atrial fibrillation   . Osteoporosis   . Hypertension   . Diabetes mellitus without complication     No past surgical history on file.  Family History  Problem Relation Age of Onset  . Heart disease Brother     Social History:  reports that she has never smoked. She does not have any smokeless tobacco history on file. She reports that she does not drink alcohol. Her drug history is not on file.  Review of Systems - Cardiovascular ROS: positive for - atrial fibrillation and hypertension  Osteoporosis: her last T score at the femoral neck was -3.1 in 9/11 compared to 2.5 in 2006. T score of the spine was -2.5  compared to previous one of 3.3. She had been on bisphosphonates including 2 doses of Reclast infusion. May have had transient increase in creatinine with last Reclast and this was stopped. Was started on Evista in 12/12 and she is tolerating this well. She did have left arm fracture recently but this was from a significant fall, still wearing a sling    Examination:   BP 112/70  Pulse 77  Temp(Src) 98.2 F (36.8 C)  Resp 10  Ht 5\' 7"  (1.702 m)  Wt 208 lb 11.2 oz (94.666 kg)  BMI 32.68 kg/m2  SpO2 97%  Body mass index is 32.68 kg/(m^2).   Heart rate 76 and heart sounds are normal   Assesment:   1. Diabetes type 2, uncontrolled - 250.02  The patient's diabetes is better controlled with being compliant with her medications Reminded her to check her blood sugars regularly to help monitor her blood sugars, she has not been doing so lately  2. Long-standing atrial fibrillation: Rate is controlled. To have EKG and followup today  3. Blood pressure appears to be well controlled and she will continue same medications  4. History of mild CKD: Appears improved  5. History of osteoporosis: to continue Evista, consider followup bone density  6. Hyperlipidemia: Well controlled as of 10/14   Susan Potter 03/14/2013, 8:34 AM   No visits with results within 4 Day(s) from this visit. Latest known visit with results is:  Appointment on 03/09/2013  Component Date Value Range Status  . Sodium 03/09/2013 139  135 - 145 mEq/L Final  . Potassium 03/09/2013 4.2  3.5 - 5.1 mEq/L Final  . Chloride 03/09/2013 103  96 - 112 mEq/L Final  . CO2 03/09/2013 27  19 - 32 mEq/L Final  . Glucose, Bld 03/09/2013 121* 70 - 99 mg/dL Final  . BUN 40/98/1191 20  6 - 23 mg/dL Final  . Creatinine, Ser 03/09/2013 1.1  0.4 - 1.2 mg/dL Final  . Calcium 47/82/9562 9.5  8.4 - 10.5 mg/dL Final  . GFR 13/11/6576 52.39* >60.00 mL/min Final  . Fructosamine 03/09/2013 243  <285 umol/L Final   Comment:                             Variations in levels of serum proteins (albumin and immunoglobulins)                          may affect fructosamine results.

## 2013-03-31 ENCOUNTER — Ambulatory Visit (INDEPENDENT_AMBULATORY_CARE_PROVIDER_SITE_OTHER): Payer: Medicare Other | Admitting: General Practice

## 2013-03-31 DIAGNOSIS — Z7901 Long term (current) use of anticoagulants: Secondary | ICD-10-CM

## 2013-03-31 DIAGNOSIS — I4891 Unspecified atrial fibrillation: Secondary | ICD-10-CM

## 2013-03-31 LAB — POCT INR: INR: 1.6

## 2013-03-31 NOTE — Progress Notes (Signed)
Pre-visit discussion using our clinic review tool. No additional management support is needed unless otherwise documented below in the visit note.  

## 2013-04-14 ENCOUNTER — Ambulatory Visit (INDEPENDENT_AMBULATORY_CARE_PROVIDER_SITE_OTHER): Payer: Medicare Other | Admitting: General Practice

## 2013-04-14 DIAGNOSIS — Z7901 Long term (current) use of anticoagulants: Secondary | ICD-10-CM

## 2013-04-14 DIAGNOSIS — I4891 Unspecified atrial fibrillation: Secondary | ICD-10-CM

## 2013-04-14 LAB — POCT INR: INR: 1.8

## 2013-04-14 NOTE — Progress Notes (Signed)
Pre-visit discussion using our clinic review tool. No additional management support is needed unless otherwise documented below in the visit note.  

## 2013-05-12 ENCOUNTER — Ambulatory Visit (INDEPENDENT_AMBULATORY_CARE_PROVIDER_SITE_OTHER): Payer: Medicare Other | Admitting: General Practice

## 2013-05-12 DIAGNOSIS — I4891 Unspecified atrial fibrillation: Secondary | ICD-10-CM

## 2013-05-12 DIAGNOSIS — Z5181 Encounter for therapeutic drug level monitoring: Secondary | ICD-10-CM

## 2013-05-12 DIAGNOSIS — Z7901 Long term (current) use of anticoagulants: Secondary | ICD-10-CM

## 2013-05-12 LAB — POCT INR: INR: 2.2

## 2013-05-12 NOTE — Progress Notes (Signed)
Pre-visit discussion using our clinic review tool. No additional management support is needed unless otherwise documented below in the visit note.  

## 2013-05-22 ENCOUNTER — Other Ambulatory Visit: Payer: Self-pay | Admitting: *Deleted

## 2013-05-22 MED ORDER — GLIMEPIRIDE 2 MG PO TABS: 1.0000 mg | ORAL_TABLET | Freq: Every day | ORAL | Status: DC

## 2013-05-25 ENCOUNTER — Other Ambulatory Visit: Payer: Self-pay | Admitting: *Deleted

## 2013-05-25 MED ORDER — ATORVASTATIN CALCIUM 10 MG PO TABS: 10.0000 mg | ORAL_TABLET | Freq: Every day | ORAL | Status: DC

## 2013-06-12 ENCOUNTER — Other Ambulatory Visit: Payer: Medicare Other

## 2013-06-15 ENCOUNTER — Ambulatory Visit: Payer: Medicare Other | Admitting: Endocrinology

## 2013-06-21 ENCOUNTER — Other Ambulatory Visit: Payer: Self-pay | Admitting: *Deleted

## 2013-06-21 MED ORDER — BISOPROLOL-HYDROCHLOROTHIAZIDE 10-6.25 MG PO TABS: 1.0000 | ORAL_TABLET | Freq: Every day | ORAL | Status: DC

## 2013-07-04 ENCOUNTER — Other Ambulatory Visit: Payer: Medicare Other

## 2013-07-04 ENCOUNTER — Telehealth: Payer: Self-pay | Admitting: General Practice

## 2013-07-04 NOTE — Telephone Encounter (Signed)
Attempted to reach patient but number not available.

## 2013-07-05 ENCOUNTER — Other Ambulatory Visit: Payer: Medicare Other

## 2013-07-05 ENCOUNTER — Other Ambulatory Visit (INDEPENDENT_AMBULATORY_CARE_PROVIDER_SITE_OTHER): Payer: Medicare Other

## 2013-07-05 DIAGNOSIS — I4891 Unspecified atrial fibrillation: Secondary | ICD-10-CM

## 2013-07-05 DIAGNOSIS — E1165 Type 2 diabetes mellitus with hyperglycemia: Principal | ICD-10-CM

## 2013-07-05 DIAGNOSIS — IMO0001 Reserved for inherently not codable concepts without codable children: Secondary | ICD-10-CM

## 2013-07-05 LAB — LIPID PANEL
CHOLESTEROL: 132 mg/dL (ref 0–200)
HDL: 48.1 mg/dL (ref >39.00)
LDL Cholesterol: 59 mg/dL (ref 0–99)
TRIGLYCERIDES: 123 mg/dL (ref 0.0–149.0)
Total CHOL/HDL Ratio: 3
VLDL: 24.6 mg/dL (ref 0.0–40.0)

## 2013-07-05 LAB — COMPREHENSIVE METABOLIC PANEL
ALBUMIN: 3.9 g/dL (ref 3.5–5.2)
ALT: 11 U/L (ref 0–35)
AST: 14 U/L (ref 0–37)
Alkaline Phosphatase: 47 U/L (ref 39–117)
BUN: 16 mg/dL (ref 6–23)
CALCIUM: 9.7 mg/dL (ref 8.4–10.5)
CHLORIDE: 102 meq/L (ref 96–112)
CO2: 31 meq/L (ref 19–32)
Creatinine, Ser: 1.4 mg/dL — ABNORMAL HIGH (ref 0.4–1.2)
GFR: 38.93 mL/min — AB (ref >60.00)
Glucose, Bld: 190 mg/dL — ABNORMAL HIGH (ref 70–99)
POTASSIUM: 4.5 meq/L (ref 3.5–5.1)
Sodium: 140 mEq/L (ref 135–145)
Total Bilirubin: 0.9 mg/dL (ref 0.3–1.2)
Total Protein: 7.5 g/dL (ref 6.0–8.3)

## 2013-07-05 LAB — MICROALBUMIN / CREATININE URINE RATIO
Creatinine,U: 97.2 mg/dL
Microalb Creat Ratio: 0.8 mg/g (ref 0.0–30.0)
Microalb, Ur: 0.8 mg/dL (ref 0.0–1.9)

## 2013-07-05 LAB — HEMOGLOBIN A1C: HEMOGLOBIN A1C: 6.7 % — AB (ref 4.6–6.5)

## 2013-07-05 LAB — TSH: TSH: 2.91 u[IU]/mL (ref 0.35–5.50)

## 2013-07-07 ENCOUNTER — Ambulatory Visit (INDEPENDENT_AMBULATORY_CARE_PROVIDER_SITE_OTHER): Payer: Medicare Other | Admitting: Endocrinology

## 2013-07-07 ENCOUNTER — Encounter: Payer: Self-pay | Admitting: Endocrinology

## 2013-07-07 VITALS — BP 124/78 | HR 76 | Temp 97.9°F | Resp 16 | Ht 67.0 in | Wt 209.4 lb

## 2013-07-07 DIAGNOSIS — I10 Essential (primary) hypertension: Secondary | ICD-10-CM

## 2013-07-07 DIAGNOSIS — N182 Chronic kidney disease, stage 2 (mild): Secondary | ICD-10-CM

## 2013-07-07 DIAGNOSIS — E1165 Type 2 diabetes mellitus with hyperglycemia: Principal | ICD-10-CM

## 2013-07-07 DIAGNOSIS — E7800 Pure hypercholesterolemia, unspecified: Secondary | ICD-10-CM

## 2013-07-07 DIAGNOSIS — IMO0001 Reserved for inherently not codable concepts without codable children: Secondary | ICD-10-CM

## 2013-07-07 NOTE — Patient Instructions (Signed)
Check sugar 3x per week at various times  Stop Lisinopril

## 2013-07-07 NOTE — Progress Notes (Signed)
Susan Potter   Reason for Appointment: Diabetes follow-up   History of Present Illness   Diagnosis: Type 2 DIABETES MELITUS, date of diagnosis: 2002   She is here for follow up of Type 2 diabetes. This has been usually mild and well-controlled especially for her age.  Her A1c generally had been around 7% She was however having rather poor control in 7/14 when she ran out of Janumet With taking her medications regularly her glucose appears to be better controlled as judged by improving A1c which is now below 7% No hypoglycemia with low-dose Amaryl She was not able to check her blood sugar recently with her meter; lab glucose was high after drinking chocolate milk  Monitors blood glucose sporadically  Glucometer: Accucheck.  Blood Glucose readings: Previously <150  Hypoglycemia frequency: Never.  Food preferences: Avoiding sweets, cereal and large portions, egg in am. Side effects from medications: None  Physical activity: a little walking  The microalbumin has been normal Diabetes complications: None  Lab Results  Component Value Date   HGBA1C 6.7* 07/05/2013   HGBA1C 7.1* 02/08/2013   HGBA1C 9.3* 11/08/2012   Lab Results  Component Value Date   MICROALBUR 0.8 07/05/2013   LDLCALC 59 07/05/2013   CREATININE 1.4* 07/05/2013      HYPERTENSION:    Her blood pressure is controlled . She is on 10 mg Ziac and 5 mg lisinopril  Again has a relatively higher creatinine and not clear why. Does not use OTC nonsteroidal drugs  ANTICOAGULATION: Currently followed in Coumadin clinic She has had long-standing atrial fibrillation without symptoms  HYPERLIPIDEMIA: Excellent control, she has been taking Lipitor regularly now  Lab Results  Component Value Date   CHOL 132 07/05/2013   HDL 48.10 07/05/2013   LDLCALC 59 07/05/2013   LDLDIRECT 148.0 11/08/2012   TRIG 123.0 07/05/2013   CHOLHDL 3 07/05/2013        Medication List       This list is accurate as of: 07/07/13  9:05 AM.   Always use your most recent med list.               ACCU-CHEK FASTCLIX LANCETS Misc  by Does not apply route. Check blood sugars once a day     atorvastatin 10 MG tablet  Commonly known as:  LIPITOR  Take 1 tablet (10 mg total) by mouth daily.     bisoprolol-hydrochlorothiazide 10-6.25 MG per tablet  Commonly known as:  ZIAC  Take 1 tablet by mouth daily.     glimepiride 2 MG tablet  Commonly known as:  AMARYL  Take 0.5 tablets (1 mg total) by mouth daily before supper.     glucose blood test strip  Commonly known as:  ACCU-CHEK SMARTVIEW  Use as instructed     glucose blood test strip  One touch Ultra 2 test strips only     lisinopril 5 MG tablet  Commonly known as:  PRINIVIL,ZESTRIL  Take 1 tablet (5 mg total) by mouth daily.     ONE TOUCH ULTRA SYSTEM KIT W/DEVICE Kit  One touch Ultra 2 Glucometer     OVER THE COUNTER MEDICATION  Place 1 drop into both eyes daily as needed (dry eyes/itching).     raloxifene 60 MG tablet  Commonly known as:  EVISTA  Take 60 mg by mouth daily.     sitaGLIPtin-metformin 50-1000 MG per tablet  Commonly known as:  JANUMET  Take 1 tablet by mouth 2 (two) times daily with a  meal.     Vitamin D (Ergocalciferol) 50000 UNITS Caps capsule  Commonly known as:  DRISDOL  Take 50,000 Units by mouth every 7 (seven) days. On Sundays     warfarin 5 MG tablet  Commonly known as:  COUMADIN  Take 1 tablet (5 mg total) by mouth daily.        Allergies: No Known Allergies  Past Medical History  Diagnosis Date  . Retinal vein occlusion   . Atrial fibrillation   . Osteoporosis   . Hypertension   . Diabetes mellitus without complication     No past surgical history on file.  Family History  Problem Relation Age of Onset  . Heart disease Brother     Social History:  reports that she has never smoked. She does not have any smokeless tobacco history on file. She reports that she does not drink alcohol. Her drug history is not on  file.  Review of Systems - Cardiovascular ROS: positive for - atrial fibrillation and hypertension  Osteoporosis: her  T score at the femoral neck was -3.1 in 9/11 compared to 2.5 in 2006. T score of the spine was -2.5 compared to previous one of 3.3. She had been on bisphosphonates including 2 doses of Reclast infusion. May have had transient increase in creatinine with last Reclast and this was stopped. Was started on Evista in 12/12 and she is tolerating this well. She did have left arm fracture recently but this was from a significant fall, still wearing a sling    Examination:   BP 124/78  Pulse 76  Temp(Src) 97.9 F (36.6 C)  Resp 16  Ht $R'5\' 7"'zV$  (1.702 m)  Wt 209 lb 6.4 oz (94.983 kg)  BMI 32.79 kg/m2  SpO2 96%  Body mass index is 32.79 kg/(m^2).   Repeat BP 122/66  No pedal edema  Assesment:   1. Diabetes type 2, uncontrolled   The patient's diabetes is well controlled and she is again compliant with her Janumet and low dose Amaryl Reminded her to check her blood sugars regularly to help monitor her blood sugars, she will call if she needs a new meter  2. Long-standing atrial fibrillation: Rate is controlled.   3. Blood pressure appears to be well controlled   4. Mild CKD: Her creatinine is up again at 1.4 and  will stop her lisinopril for now Since her blood pressure is relatively low for her; with renal dysfunction may have to reduce her diabetes medications  5. History of osteoporosis: to continue Evista, consider followup bone density  6. Hyperlipidemia: Well controlled    Hermes Wafer 07/07/2013, 9:05 AM   Appointment on 07/05/2013  Component Date Value Ref Range Status  . Hemoglobin A1C 07/05/2013 6.7* 4.6 - 6.5 % Final   Glycemic Control Guidelines for People with Diabetes:Non Diabetic:  <6%Goal of Therapy: <7%Additional Action Suggested:  >8%   . Sodium 07/05/2013 140  135 - 145 mEq/L Final  . Potassium 07/05/2013 4.5  3.5 - 5.1 mEq/L Final  . Chloride  07/05/2013 102  96 - 112 mEq/L Final  . CO2 07/05/2013 31  19 - 32 mEq/L Final  . Glucose, Bld 07/05/2013 190* 70 - 99 mg/dL Final  . BUN 07/05/2013 16  6 - 23 mg/dL Final  . Creatinine, Ser 07/05/2013 1.4* 0.4 - 1.2 mg/dL Final  . Total Bilirubin 07/05/2013 0.9  0.3 - 1.2 mg/dL Final  . Alkaline Phosphatase 07/05/2013 47  39 - 117 U/L Final  . AST 07/05/2013  14  0 - 37 U/L Final  . ALT 07/05/2013 11  0 - 35 U/L Final  . Total Protein 07/05/2013 7.5  6.0 - 8.3 g/dL Final  . Albumin 07/05/2013 3.9  3.5 - 5.2 g/dL Final  . Calcium 07/05/2013 9.7  8.4 - 10.5 mg/dL Final  . GFR 07/05/2013 38.93* >60.00 mL/min Final  . Microalb, Ur 07/05/2013 0.8  0.0 - 1.9 mg/dL Final  . Creatinine,U 07/05/2013 97.2   Final  . Microalb Creat Ratio 07/05/2013 0.8  0.0 - 30.0 mg/g Final  . Cholesterol 07/05/2013 132  0 - 200 mg/dL Final   ATP III Classification       Desirable:  < 200 mg/dL               Borderline High:  200 - 239 mg/dL          High:  > = 240 mg/dL  . Triglycerides 07/05/2013 123.0  0.0 - 149.0 mg/dL Final   Normal:  <150 mg/dLBorderline High:  150 - 199 mg/dL  . HDL 07/05/2013 48.10  >39.00 mg/dL Final  . VLDL 07/05/2013 24.6  0.0 - 40.0 mg/dL Final  . LDL Cholesterol 07/05/2013 59  0 - 99 mg/dL Final  . Total CHOL/HDL Ratio 07/05/2013 3   Final                  Men          Women1/2 Average Risk     3.4          3.3Average Risk          5.0          4.42X Average Risk          9.6          7.13X Average Risk          15.0          11.0                      . TSH 07/05/2013 2.91  0.35 - 5.50 uIU/mL Final

## 2013-07-12 ENCOUNTER — Ambulatory Visit (INDEPENDENT_AMBULATORY_CARE_PROVIDER_SITE_OTHER): Payer: Medicare Other | Admitting: General Practice

## 2013-07-12 DIAGNOSIS — Z5181 Encounter for therapeutic drug level monitoring: Secondary | ICD-10-CM

## 2013-07-12 DIAGNOSIS — Z7901 Long term (current) use of anticoagulants: Secondary | ICD-10-CM

## 2013-07-12 DIAGNOSIS — I4891 Unspecified atrial fibrillation: Secondary | ICD-10-CM

## 2013-07-12 LAB — POCT INR: INR: 2

## 2013-07-12 NOTE — Progress Notes (Signed)
Pre visit review using our clinic review tool, if applicable. No additional management support is needed unless otherwise documented below in the visit note. 

## 2013-07-26 ENCOUNTER — Other Ambulatory Visit: Payer: Self-pay | Admitting: *Deleted

## 2013-07-26 MED ORDER — WARFARIN SODIUM 5 MG PO TABS: 5.0000 mg | ORAL_TABLET | Freq: Every day | ORAL | Status: DC

## 2013-08-09 ENCOUNTER — Ambulatory Visit (INDEPENDENT_AMBULATORY_CARE_PROVIDER_SITE_OTHER): Payer: Medicare Other | Admitting: General Practice

## 2013-08-09 DIAGNOSIS — I4891 Unspecified atrial fibrillation: Secondary | ICD-10-CM

## 2013-08-09 DIAGNOSIS — Z7901 Long term (current) use of anticoagulants: Secondary | ICD-10-CM

## 2013-08-09 DIAGNOSIS — Z5181 Encounter for therapeutic drug level monitoring: Secondary | ICD-10-CM

## 2013-08-09 LAB — POCT INR: INR: 2

## 2013-08-09 NOTE — Progress Notes (Signed)
Pre visit review using our clinic review tool, if applicable. No additional management support is needed unless otherwise documented below in the visit note. 

## 2013-09-20 ENCOUNTER — Ambulatory Visit (INDEPENDENT_AMBULATORY_CARE_PROVIDER_SITE_OTHER): Payer: Medicare Other | Admitting: General Practice

## 2013-09-20 DIAGNOSIS — Z5181 Encounter for therapeutic drug level monitoring: Secondary | ICD-10-CM

## 2013-09-20 DIAGNOSIS — Z7901 Long term (current) use of anticoagulants: Secondary | ICD-10-CM

## 2013-09-20 DIAGNOSIS — I4891 Unspecified atrial fibrillation: Secondary | ICD-10-CM

## 2013-09-20 LAB — POCT INR: INR: 1.7

## 2013-09-20 NOTE — Progress Notes (Signed)
Pre visit review using our clinic review tool, if applicable. No additional management support is needed unless otherwise documented below in the visit note. 

## 2013-10-06 ENCOUNTER — Other Ambulatory Visit (INDEPENDENT_AMBULATORY_CARE_PROVIDER_SITE_OTHER): Payer: Medicare Other

## 2013-10-06 DIAGNOSIS — IMO0001 Reserved for inherently not codable concepts without codable children: Secondary | ICD-10-CM

## 2013-10-06 DIAGNOSIS — E1165 Type 2 diabetes mellitus with hyperglycemia: Principal | ICD-10-CM

## 2013-10-06 LAB — COMPREHENSIVE METABOLIC PANEL
ALK PHOS: 46 U/L (ref 39–117)
ALT: 11 U/L (ref 0–35)
AST: 18 U/L (ref 0–37)
Albumin: 4 g/dL (ref 3.5–5.2)
BUN: 11 mg/dL (ref 6–23)
CO2: 33 meq/L — AB (ref 19–32)
Calcium: 9.8 mg/dL (ref 8.4–10.5)
Chloride: 105 mEq/L (ref 96–112)
Creatinine, Ser: 1.1 mg/dL (ref 0.4–1.2)
GFR: 49.09 mL/min — ABNORMAL LOW (ref >60.00)
Glucose, Bld: 118 mg/dL — ABNORMAL HIGH (ref 70–99)
Potassium: 4.8 mEq/L (ref 3.5–5.1)
SODIUM: 143 meq/L (ref 135–145)
Total Bilirubin: 0.8 mg/dL (ref 0.2–1.2)
Total Protein: 7.6 g/dL (ref 6.0–8.3)

## 2013-10-06 LAB — HEMOGLOBIN A1C: Hgb A1c MFr Bld: 6.6 % — ABNORMAL HIGH (ref 4.6–6.5)

## 2013-10-09 ENCOUNTER — Encounter: Payer: Self-pay | Admitting: Endocrinology

## 2013-10-09 ENCOUNTER — Ambulatory Visit (INDEPENDENT_AMBULATORY_CARE_PROVIDER_SITE_OTHER): Payer: Medicare Other | Admitting: Endocrinology

## 2013-10-09 VITALS — BP 130/72 | HR 90 | Temp 98.3°F | Resp 16 | Ht 67.0 in | Wt 210.8 lb

## 2013-10-09 DIAGNOSIS — I10 Essential (primary) hypertension: Secondary | ICD-10-CM

## 2013-10-09 DIAGNOSIS — I4891 Unspecified atrial fibrillation: Secondary | ICD-10-CM

## 2013-10-09 DIAGNOSIS — E119 Type 2 diabetes mellitus without complications: Secondary | ICD-10-CM

## 2013-10-09 DIAGNOSIS — I482 Chronic atrial fibrillation, unspecified: Secondary | ICD-10-CM

## 2013-10-09 DIAGNOSIS — N182 Chronic kidney disease, stage 2 (mild): Secondary | ICD-10-CM

## 2013-10-09 DIAGNOSIS — E7800 Pure hypercholesterolemia, unspecified: Secondary | ICD-10-CM

## 2013-10-09 NOTE — Progress Notes (Signed)
Susan Potter 80 y.o.    Reason for Appointment: Diabetes follow-up   History of Present Illness   Diagnosis: Type 2 DIABETES MELITUS, date of diagnosis: 2002   She is here for follow up of Type 2 diabetes. This has been usually mild and well-controlled especially for her age.  Her A1c generally had been around 7% She was  having rather poor control in 7/14 when she ran out of Janumet With taking her medications regularly her glucose appears to be consistently controlled as judged by the A1c which is again below 7% No hypoglycemia with low-dose Amaryl She was having some difficulties with her monitor and has not checked it recently, new meter given today Usually compliant with her diet but is not very active and not able to lose weight  Glucometer: Accucheck.  Blood Glucose readings: 130-150  Hypoglycemia frequency: Never.  Food preferences: Avoiding sweets, cereal and large portions, egg in am. Side effects from medications: None  Physical activity: a little walking in house   The microalbumin has been normal Diabetes complications: None  Wt Readings from Last 3 Encounters:  10/09/13 210 lb 12.8 oz (95.618 kg)  07/07/13 209 lb 6.4 oz (94.983 kg)  03/14/13 208 lb 11.2 oz (94.666 kg)    Lab Results  Component Value Date   HGBA1C 6.6* 10/06/2013   HGBA1C 6.7* 07/05/2013   HGBA1C 7.1* 02/08/2013   Lab Results  Component Value Date   MICROALBUR 0.8 07/05/2013   LDLCALC 59 07/05/2013   CREATININE 1.1 10/06/2013      HYPERTENSION:    Her blood pressure is controlled . She is on 10 mg Ziac and 5 mg lisinopril   ANTICOAGULATION: Currently followed in Coumadin clinic She has had long-standing atrial fibrillation without symptoms  HYPERLIPIDEMIA: Excellent control, she has been taking Lipitor regularly   Lab Results  Component Value Date   CHOL 132 07/05/2013   HDL 48.10 07/05/2013   LDLCALC 59 07/05/2013   LDLDIRECT 148.0 11/08/2012   TRIG 123.0 07/05/2013   CHOLHDL 3  07/05/2013        Medication List       This list is accurate as of: 10/09/13  8:54 AM.  Always use your most recent med list.               ACCU-CHEK FASTCLIX LANCETS Misc  by Does not apply route. Check blood sugars once a day     atorvastatin 10 MG tablet  Commonly known as:  LIPITOR  Take 1 tablet (10 mg total) by mouth daily.     bisoprolol-hydrochlorothiazide 10-6.25 MG per tablet  Commonly known as:  ZIAC  Take 1 tablet by mouth daily.     glimepiride 2 MG tablet  Commonly known as:  AMARYL  Take 0.5 tablets (1 mg total) by mouth daily before supper.     glucose blood test strip  Commonly known as:  ACCU-CHEK SMARTVIEW  Use as instructed     glucose blood test strip  One touch Ultra 2 test strips only     lisinopril 5 MG tablet  Commonly known as:  PRINIVIL,ZESTRIL  Take 1 tablet (5 mg total) by mouth daily.     ONE TOUCH ULTRA SYSTEM KIT W/DEVICE Kit  One touch Ultra 2 Glucometer     OVER THE COUNTER MEDICATION  Place 1 drop into both eyes daily as needed (dry eyes/itching).     raloxifene 60 MG tablet  Commonly known as:  EVISTA  Take 60 mg  by mouth daily.     sitaGLIPtin-metformin 50-1000 MG per tablet  Commonly known as:  JANUMET  Take 1 tablet by mouth 2 (two) times daily with a meal.     Vitamin D (Ergocalciferol) 50000 UNITS Caps capsule  Commonly known as:  DRISDOL  Take 50,000 Units by mouth every 7 (seven) days. On Sundays     warfarin 5 MG tablet  Commonly known as:  COUMADIN  Take 1 tablet (5 mg total) by mouth daily.        Allergies: No Known Allergies  Past Medical History  Diagnosis Date  . Retinal vein occlusion   . Atrial fibrillation   . Osteoporosis   . Hypertension   . Diabetes mellitus without complication     No past surgical history on file.  Family History  Problem Relation Age of Onset  . Heart disease Brother     Social History:  reports that she has never smoked. She does not have any smokeless  tobacco history on file. She reports that she does not drink alcohol. Her drug history is not on file.  Review of Systems - Cardiovascular ROS: positive for - atrial fibrillation and hypertension  Osteoporosis: her  T score at the femoral neck was -3.1 in 9/11 compared to 2.5 in 2006. T score of the spine was -2.5 compared to previous one of 3.3. She had been on bisphosphonates including 2 doses of Reclast infusion. May have had transient increase in creatinine with last Reclast and this was stopped. Was started on Evista in 12/12 and she is tolerating this well. She did have left arm fracture  but this was from a significant fall   Renal insufficiency: Creatinine better at 1.1    Examination:   BP 130/72  Pulse 90  Temp(Src) 98.3 F (36.8 C)  Resp 16  Ht '5\' 7"'  (1.702 m)  Wt 210 lb 12.8 oz (95.618 kg)  BMI 33.01 kg/m2  SpO2 96%  Body mass index is 33.01 kg/(m^2).   No pedal edema  Heart rate about 76-80, slightly irregular. Normal heart sounds  Assesment:   1. Diabetes type 2, with mild obesity   The patient's diabetes is well controlled and she is again compliant with her Janumet and low dose Amaryl  Will not change her dose of Janumet since creatinine clearance is adequate Reminded her to check her blood sugars regularly to help monitor her blood sugars, she will start using her new monitor that was given today  2. Long-standing atrial fibrillation: Rate is controlled.   3. Blood pressure looks to be well controlled   4. Mild CKD: Her creatinine is improved with stopping  her lisinopril and blood pressure is not any higher  5. History of osteoporosis: to continue Evista, consider followup bone density  6. Hyperlipidemia: Well controlled as of 3/15   [place] 10/09/2013, 8:54 AM   Appointment on 10/06/2013  Component Date Value Ref Range Status  . Hemoglobin A1C 10/06/2013 6.6* 4.6 - 6.5 % Final   Glycemic Control Guidelines for People with Diabetes:Non Diabetic:   <6%Goal of Therapy: <7%Additional Action Suggested:  >8%   . Sodium 10/06/2013 143  135 - 145 mEq/L Final  . Potassium 10/06/2013 4.8  3.5 - 5.1 mEq/L Final  . Chloride 10/06/2013 105  96 - 112 mEq/L Final  . CO2 10/06/2013 33* 19 - 32 mEq/L Final  . Glucose, Bld 10/06/2013 118* 70 - 99 mg/dL Final  . BUN 10/06/2013 11  6 - 23 mg/dL  Final  . Creatinine, Ser 10/06/2013 1.1  0.4 - 1.2 mg/dL Final  . Total Bilirubin 10/06/2013 0.8  0.2 - 1.2 mg/dL Final  . Alkaline Phosphatase 10/06/2013 46  39 - 117 U/L Final  . AST 10/06/2013 18  0 - 37 U/L Final  . ALT 10/06/2013 11  0 - 35 U/L Final  . Total Protein 10/06/2013 7.6  6.0 - 8.3 g/dL Final  . Albumin 10/06/2013 4.0  3.5 - 5.2 g/dL Final  . Calcium 10/06/2013 9.8  8.4 - 10.5 mg/dL Final  . GFR 10/06/2013 49.09* >60.00 mL/min Final

## 2013-11-01 ENCOUNTER — Ambulatory Visit: Payer: Medicare Other

## 2013-11-03 ENCOUNTER — Ambulatory Visit (INDEPENDENT_AMBULATORY_CARE_PROVIDER_SITE_OTHER): Payer: Medicare Other | Admitting: General Practice

## 2013-11-03 DIAGNOSIS — Z5181 Encounter for therapeutic drug level monitoring: Secondary | ICD-10-CM

## 2013-11-03 DIAGNOSIS — Z7901 Long term (current) use of anticoagulants: Secondary | ICD-10-CM

## 2013-11-03 DIAGNOSIS — I4891 Unspecified atrial fibrillation: Secondary | ICD-10-CM

## 2013-11-03 LAB — POCT INR: INR: 2.5

## 2013-11-03 NOTE — Progress Notes (Signed)
Pre visit review using our clinic review tool, if applicable. No additional management support is needed unless otherwise documented below in the visit note. 

## 2013-11-16 LAB — HM DIABETES EYE EXAM

## 2013-11-17 ENCOUNTER — Encounter: Payer: Self-pay | Admitting: *Deleted

## 2013-11-17 LAB — HM DIABETES EYE EXAM

## 2013-11-29 ENCOUNTER — Ambulatory Visit (INDEPENDENT_AMBULATORY_CARE_PROVIDER_SITE_OTHER): Payer: Medicare Other | Admitting: Family Medicine

## 2013-11-29 DIAGNOSIS — I4891 Unspecified atrial fibrillation: Secondary | ICD-10-CM

## 2013-11-29 DIAGNOSIS — Z7901 Long term (current) use of anticoagulants: Secondary | ICD-10-CM

## 2013-11-29 DIAGNOSIS — Z5181 Encounter for therapeutic drug level monitoring: Secondary | ICD-10-CM

## 2013-11-29 LAB — POCT INR: INR: 2.7

## 2013-12-04 ENCOUNTER — Other Ambulatory Visit: Payer: Self-pay | Admitting: *Deleted

## 2013-12-04 MED ORDER — ATORVASTATIN CALCIUM 10 MG PO TABS: 10.0000 mg | ORAL_TABLET | Freq: Every day | ORAL | Status: DC

## 2013-12-19 ENCOUNTER — Other Ambulatory Visit: Payer: Self-pay | Admitting: *Deleted

## 2013-12-19 MED ORDER — SITAGLIPTIN PHOS-METFORMIN HCL 50-1000 MG PO TABS: 1.0000 | ORAL_TABLET | Freq: Two times a day (BID) | ORAL | Status: DC

## 2013-12-27 ENCOUNTER — Ambulatory Visit (INDEPENDENT_AMBULATORY_CARE_PROVIDER_SITE_OTHER): Payer: Medicare Other | Admitting: *Deleted

## 2013-12-27 DIAGNOSIS — I4891 Unspecified atrial fibrillation: Secondary | ICD-10-CM

## 2013-12-27 DIAGNOSIS — Z5181 Encounter for therapeutic drug level monitoring: Secondary | ICD-10-CM

## 2013-12-27 DIAGNOSIS — Z7901 Long term (current) use of anticoagulants: Secondary | ICD-10-CM

## 2013-12-27 LAB — POCT INR: INR: 3.4

## 2014-01-05 ENCOUNTER — Other Ambulatory Visit: Payer: Self-pay | Admitting: *Deleted

## 2014-01-05 MED ORDER — BISOPROLOL-HYDROCHLOROTHIAZIDE 10-6.25 MG PO TABS: 1.0000 | ORAL_TABLET | Freq: Every day | ORAL | Status: DC

## 2014-01-08 ENCOUNTER — Encounter: Payer: Self-pay | Admitting: Endocrinology

## 2014-01-08 ENCOUNTER — Ambulatory Visit (INDEPENDENT_AMBULATORY_CARE_PROVIDER_SITE_OTHER): Payer: Medicare Other | Admitting: Endocrinology

## 2014-01-08 VITALS — BP 127/79 | HR 87 | Temp 98.3°F | Resp 16 | Ht 66.0 in | Wt 213.0 lb

## 2014-01-08 DIAGNOSIS — E1165 Type 2 diabetes mellitus with hyperglycemia: Principal | ICD-10-CM

## 2014-01-08 DIAGNOSIS — IMO0001 Reserved for inherently not codable concepts without codable children: Secondary | ICD-10-CM

## 2014-01-08 DIAGNOSIS — M810 Age-related osteoporosis without current pathological fracture: Secondary | ICD-10-CM

## 2014-01-08 DIAGNOSIS — N182 Chronic kidney disease, stage 2 (mild): Secondary | ICD-10-CM

## 2014-01-08 DIAGNOSIS — Z0000 Encounter for general adult medical examination without abnormal findings: Secondary | ICD-10-CM

## 2014-01-08 DIAGNOSIS — E119 Type 2 diabetes mellitus without complications: Secondary | ICD-10-CM

## 2014-01-08 DIAGNOSIS — Z23 Encounter for immunization: Secondary | ICD-10-CM

## 2014-01-08 DIAGNOSIS — E559 Vitamin D deficiency, unspecified: Secondary | ICD-10-CM

## 2014-01-08 DIAGNOSIS — E7800 Pure hypercholesterolemia, unspecified: Secondary | ICD-10-CM

## 2014-01-08 LAB — URINALYSIS, ROUTINE W REFLEX MICROSCOPIC
Bilirubin Urine: NEGATIVE
Ketones, ur: NEGATIVE
NITRITE: NEGATIVE
Specific Gravity, Urine: 1.015 (ref 1.000–1.030)
TOTAL PROTEIN, URINE-UPE24: NEGATIVE
Urine Glucose: NEGATIVE
Urobilinogen, UA: 0.2 (ref 0.0–1.0)
pH: 6 (ref 5.0–8.0)

## 2014-01-08 LAB — CBC
HCT: 41.5 % (ref 36.0–46.0)
Hemoglobin: 13.8 g/dL (ref 12.0–15.0)
MCHC: 33.1 g/dL (ref 30.0–36.0)
MCV: 86.6 fl (ref 78.0–100.0)
Platelets: 299 10*3/uL (ref 150.0–400.0)
RBC: 4.8 Mil/uL (ref 3.87–5.11)
RDW: 14.8 % (ref 11.5–15.5)
WBC: 10.2 10*3/uL (ref 4.0–10.5)

## 2014-01-08 LAB — LIPID PANEL
CHOLESTEROL: 147 mg/dL (ref 0–200)
HDL: 43.3 mg/dL (ref >39.00)
LDL Cholesterol: 65 mg/dL (ref 0–99)
NonHDL: 103.7
TRIGLYCERIDES: 193 mg/dL — AB (ref 0.0–149.0)
Total CHOL/HDL Ratio: 3
VLDL: 38.6 mg/dL (ref 0.0–40.0)

## 2014-01-08 LAB — COMPREHENSIVE METABOLIC PANEL
ALBUMIN: 4.1 g/dL (ref 3.5–5.2)
ALK PHOS: 53 U/L (ref 39–117)
ALT: 11 U/L (ref 0–35)
AST: 21 U/L (ref 0–37)
BUN: 19 mg/dL (ref 6–23)
CO2: 26 mEq/L (ref 19–32)
Calcium: 9.8 mg/dL (ref 8.4–10.5)
Chloride: 102 mEq/L (ref 96–112)
Creatinine, Ser: 1.3 mg/dL — ABNORMAL HIGH (ref 0.4–1.2)
GFR: 40.95 mL/min — ABNORMAL LOW (ref >60.00)
GLUCOSE: 141 mg/dL — AB (ref 70–99)
Potassium: 4.1 mEq/L (ref 3.5–5.1)
Sodium: 139 mEq/L (ref 135–145)
Total Bilirubin: 1 mg/dL (ref 0.2–1.2)
Total Protein: 8.2 g/dL (ref 6.0–8.3)

## 2014-01-08 LAB — MICROALBUMIN / CREATININE URINE RATIO
CREATININE, U: 136.2 mg/dL
MICROALB/CREAT RATIO: 1.2 mg/g (ref 0.0–30.0)
Microalb, Ur: 1.6 mg/dL (ref 0.0–1.9)

## 2014-01-08 LAB — LDL CHOLESTEROL, DIRECT: Direct LDL: 84.6 mg/dL

## 2014-01-08 LAB — HEMOGLOBIN A1C: Hgb A1c MFr Bld: 6.9 % — ABNORMAL HIGH (ref 4.6–6.5)

## 2014-01-08 NOTE — Patient Instructions (Signed)
Please check blood sugars at least half the time about 2 hours after any meal and times per week on waking up. Please bring blood sugar monitor to each visit  

## 2014-01-08 NOTE — Progress Notes (Signed)
Susan Potter 80 y.o.    Reason for Appointment: Complete physical exam and diabetes followup  History of Present Illness   Diagnosis: Type 2 DIABETES MELITUS, date of diagnosis: 2002   She is being seen for follow up of Type 2 diabetes. This has been usually mild and well-controlled especially for her age.  Has been on a 3 drug regimen Her A1c generally had been around 7% She was  having rather poor control in 7/14 when she ran out of Janumet With taking her medications regularly her glucose appears to be consistently controlled as judged by the A1c which is consistently below 7% more recently No hypoglycemia with low-dose Amaryl Usually compliant with her diet but is not very active  Has been gradually gaining weight   Glucometer: Accucheck.  Blood Glucose readings: 125-130 , checking mostly in the mornings and usually does not bring her monitor for review  Hypoglycemia frequency: Never.  Food preferences: Avoiding sweets, cereal and large portions, getting an egg in am for breakfast. Side effects from medications: None Physical activity: a little walking up to 10 minutes  The microalbumin has been normal consistently Diabetes complications: None  Wt Readings from Last 3 Encounters:  01/08/14 213 lb (96.616 kg)  10/09/13 210 lb 12.8 oz (95.618 kg)  07/07/13 209 lb 6.4 oz (94.983 kg)    Lab Results  Component Value Date   HGBA1C 6.6* 10/06/2013   HGBA1C 6.7* 07/05/2013   HGBA1C 7.1* 02/08/2013   Lab Results  Component Value Date   MICROALBUR 0.8 07/05/2013   LDLCALC 59 07/05/2013   CREATININE 1.1 10/06/2013      HYPERTENSION:    Her blood pressure is controlled . She is on 10 mg Ziac only at this time, not monitoring at home  ANTICOAGULATION for atrial fibrillation: Currently followed in Coumadin clinic and last INR was 3.4 She has had long-standing atrial fibrillation without symptoms  HYPERLIPIDEMIA: Excellent control, she has been taking Lipitor regularly    Lab Results  Component Value Date   CHOL 132 07/05/2013   HDL 48.10 07/05/2013   LDLCALC 59 07/05/2013   LDLDIRECT 148.0 11/08/2012   TRIG 123.0 07/05/2013   CHOLHDL 3 07/05/2013    PREVENTIVE CARE:  Diet: Usually low fat Exercise: Some walking, limited by low back pain History of falls: In 9/14 with missing a step on her staircase  Annual hemoccults:           not indicated   Breast self exams:                             yes   Colonoscopy/sigmoidoscopy  not indicated  Mammograms:  2013  Yearly flu vaccine:                      yes   Bone Density:   2011  Calcium supplements:  yes  Tetanus booster:  ?   Zostavax:  09/2005  Pneumovax:  08/2000        Medication List       This list is accurate as of: 01/08/14  8:14 AM.  Always use your most recent med list.               ACCU-CHEK FASTCLIX LANCETS Misc  by Does not apply route. Check blood sugars once a day     atorvastatin 10 MG tablet  Commonly known as:  LIPITOR  Take 1 tablet (10 mg  total) by mouth daily.     bisoprolol-hydrochlorothiazide 10-6.25 MG per tablet  Commonly known as:  ZIAC  Take 1 tablet by mouth daily.     glimepiride 2 MG tablet  Commonly known as:  AMARYL  Take 0.5 tablets (1 mg total) by mouth daily before supper.     glucose blood test strip  Commonly known as:  ACCU-CHEK SMARTVIEW  Use as instructed     glucose blood test strip  One touch Ultra 2 test strips only     lisinopril 5 MG tablet  Commonly known as:  PRINIVIL,ZESTRIL  Take 1 tablet (5 mg total) by mouth daily.     ONE TOUCH ULTRA SYSTEM KIT W/DEVICE Kit  One touch Ultra 2 Glucometer     OVER THE COUNTER MEDICATION  Place 1 drop into both eyes daily as needed (dry eyes/itching).     raloxifene 60 MG tablet  Commonly known as:  EVISTA  Take 60 mg by mouth daily.     sitaGLIPtin-metformin 50-1000 MG per tablet  Commonly known as:  JANUMET  Take 1 tablet by mouth 2 (two) times daily with a meal.     Vitamin D  (Ergocalciferol) 50000 UNITS Caps capsule  Commonly known as:  DRISDOL  Take 50,000 Units by mouth every 7 (seven) days. On Sundays     warfarin 5 MG tablet  Commonly known as:  COUMADIN  Take 1 tablet (5 mg total) by mouth daily.        Allergies: No Known Allergies  Past Medical History  Diagnosis Date  . Retinal vein occlusion   . Atrial fibrillation   . Osteoporosis   . Hypertension   . Diabetes mellitus without complication     No past surgical history on file.  Family History  Problem Relation Age of Onset  . Heart disease Brother     Social History:  reports that she has never smoked. She does not have any smokeless tobacco history on file. She reports that she does not drink alcohol. Her drug history is not on file.  Review of Systems -   Osteoporosis: her  T score at the femoral neck was -3.1 in 12/2009 compared to 2.5 in 2006. T score of the spine was -2.5 compared to previous one of 3.3. She had been on bisphosphonates including 2 doses of Reclast infusion. May have had transient increase in creatinine with last Reclast and this was stopped. Was started on Evista in 12/12 and she is tolerating this well. She did have left arm fracture in 9/14 but this was from a significant fall   Renal insufficiency: Present in the past, stable recently in the upper normal range  Lab Results  Component Value Date   CREATININE 1.1 10/06/2013       No unusual headaches.     ENT:  No difficulties with nasal congestion or ear symptoms.  Normal hearing.      Skin: No rash or infections     Thyroid:  No cold or heat intolerance, unusual fatigue. TSH normal previously     Her blood pressure has been high and has been on Ziac since 2002.  Lisinopril stopped because of relatively high creatinine   Usually well controlled.     She has been told to have an irregular heart since 1980s but had never been treated till seen in 2002.  Coumadin started in 2002 Does not complain of any  palpitations     No swelling of feet.  No shortness of breath on exertion.     Bowel habits: Normal, no change.                   No heartburn or abdominal pain       Urinary symptoms: Nocturia once usually; no dysuria. Having some urge incontinence, does not desire treatment     Her feet especially left side may occasionally feel a little numb      No joint pains. Some back pain on standing or walking for long, does not take any analgesics    Examination:   BP 127/79  Pulse 87  Temp(Src) 98.3 F (36.8 C)  Resp 16  Ht '5\' 6"'  (1.676 m)  Wt 213 lb (96.616 kg)  BMI 34.40 kg/m2  SpO2 96%  Body mass index is 34.4 kg/(m^2).    GENERAL: Mild generalized obesity present  No pallor, clubbing, lymphadenopathy or edema. .  Skin:  no rash or pigmentation.  EYES:  Externally normal.  Fundii: No August retinopathy seen  ENT:  .  Mouth & throat normal.  THYROID:  Not palpable.  CAROTIDS:  Normal character; no bruit.  HEART:  Normal apex, S1 and S2; no murmur or click. Heart rate 76 slightly irregular  CHEST:  Normal shape.  Lungs:   Vescicular breath sounds heard equally.  No crepitations/ wheeze.  BREASTS:  Skin and nipples normal.  No mass palpable.  ABDOMEN:  No distention.  Liver and spleen not palpable.  No other mass or tenderness. No abdominal bruit, no abnormal epigastric pulsation felt    RECTAL exam:  Not indicated.                     Pelvic exam:  Not indicated.  NEUROLOGICAL:. Monofilament sensation in toes is normal        Vibration sense at toes is moderately reduced bilaterally.  Reflexes are absent bilaterally at ankles  SPINE AND JOINTS:  Normal. No tenderness or deformity of the spine  Skin: Plantar warts present  Assesment/Plan:   1. Diabetes type 2, with  obesity   The patient's diabetes is well controlled and she is  compliant with her Janumet and low dose Amaryl  No symptoms of hypoglycemia reported Need to check her A1c again today Reminded  her to check her blood sugars after meals also and be consistent  No significant diabetes complications, also has had normal urine microalbumin despite stopping lisinopril  2. Long-standing atrial fibrillation: Rate is controlled with beta blocker.   3. Blood pressure is controlled on Ziac alone.  4. Chronic asymptomatic pyuria. Urinalysis to be rechecked  5. History of osteoporosis: to continue Evista. Has not had any history of fractures or height loss. Her only fracture has been related to significant trauma  6. Hyperlipidemia: Well controlled as of 3/15, will have followup  PREVENTIVE care:  Discussed healthy balanced diet with control of fat and carbohydrates Encouraged her to walk as much as tolerated Depression screening done Discussed preventive vaccinations, she is due for her influenza vaccine today and will have her get Prevnar on the next visit Other vaccines are up to date  She has not had her mammogram and this will be scheduled Because of her age colorectal screening not indicated, will have CBC done Lipid control discussed She will continue on calcium supplements and needs assessment of vitamin D level She will continue Evista for fracture prevention Prophylactic aspirin is not needed as she is on Coumadin  Sya Nestler 01/08/2014, 8:14 AM   Addendum: Labs as follows  Creatinine minimally increased, will reduce her Janumet to once daily  Office Visit on 01/08/2014  Component Date Value Ref Range Status  . Hemoglobin A1C 01/08/2014 6.9* 4.6 - 6.5 % Final   Glycemic Control Guidelines for People with Diabetes:Non Diabetic:  <6%Goal of Therapy: <7%Additional Action Suggested:  >8%   . Sodium 01/08/2014 139  135 - 145 mEq/L Final  . Potassium 01/08/2014 4.1  3.5 - 5.1 mEq/L Final  . Chloride 01/08/2014 102  96 - 112 mEq/L Final  . CO2 01/08/2014 26  19 - 32 mEq/L Final  . Glucose, Bld 01/08/2014 141* 70 - 99 mg/dL Final  . BUN 01/08/2014 19  6 - 23 mg/dL Final   . Creatinine, Ser 01/08/2014 1.3* 0.4 - 1.2 mg/dL Final  . Total Bilirubin 01/08/2014 1.0  0.2 - 1.2 mg/dL Final  . Alkaline Phosphatase 01/08/2014 53  39 - 117 U/L Final  . AST 01/08/2014 21  0 - 37 U/L Final  . ALT 01/08/2014 11  0 - 35 U/L Final  . Total Protein 01/08/2014 8.2  6.0 - 8.3 g/dL Final  . Albumin 01/08/2014 4.1  3.5 - 5.2 g/dL Final  . Calcium 01/08/2014 9.8  8.4 - 10.5 mg/dL Final  . GFR 01/08/2014 40.95* >60.00 mL/min Final  . Cholesterol 01/08/2014 147  0 - 200 mg/dL Final   ATP III Classification       Desirable:  < 200 mg/dL               Borderline High:  200 - 239 mg/dL          High:  > = 240 mg/dL  . Triglycerides 01/08/2014 193.0* 0.0 - 149.0 mg/dL Final   Normal:  <150 mg/dLBorderline High:  150 - 199 mg/dL  . HDL 01/08/2014 43.30  >39.00 mg/dL Final  . VLDL 01/08/2014 38.6  0.0 - 40.0 mg/dL Final  . LDL Cholesterol 01/08/2014 65  0 - 99 mg/dL Final  . Total CHOL/HDL Ratio 01/08/2014 3   Final                  Men          Women1/2 Average Risk     3.4          3.3Average Risk          5.0          4.42X Average Risk          9.6          7.13X Average Risk          15.0          11.0                      . NonHDL 01/08/2014 103.70   Final   NOTE:  Non-HDL goal should be 30 mg/dL higher than patient's LDL goal (i.e. LDL goal of < 70 mg/dL, would have non-HDL goal of < 100 mg/dL)  . Direct LDL 01/08/2014 84.6   Final   Optimal:  <100 mg/dLNear or Above Optimal:  100-129 mg/dLBorderline High:  130-159 mg/dLHigh:  160-189 mg/dLVery High:  >190 mg/dL  . Microalb, Ur 01/08/2014 1.6  0.0 - 1.9 mg/dL Final  . Creatinine,U 01/08/2014 136.2   Final  . Microalb Creat Ratio 01/08/2014 1.2  0.0 - 30.0 mg/g Final  . WBC 01/08/2014 10.2  4.0 - 10.5 K/uL  Final  . RBC 01/08/2014 4.80  3.87 - 5.11 Mil/uL Final  . Platelets 01/08/2014 299.0  150.0 - 400.0 K/uL Final  . Hemoglobin 01/08/2014 13.8  12.0 - 15.0 g/dL Final  . HCT 01/08/2014 41.5  36.0 - 46.0 % Final  . MCV  01/08/2014 86.6  78.0 - 100.0 fl Final  . MCHC 01/08/2014 33.1  30.0 - 36.0 g/dL Final  . RDW 01/08/2014 14.8  11.5 - 15.5 % Final  . Color, Urine 01/08/2014 YELLOW  Yellow;Lt. Yellow Final  . APPearance 01/08/2014 Cloudy* Clear Final  . Specific Gravity, Urine 01/08/2014 1.015  1.000-1.030 Final  . pH 01/08/2014 6.0  5.0 - 8.0 Final  . Total Protein, Urine 01/08/2014 NEGATIVE  Negative Final  . Urine Glucose 01/08/2014 NEGATIVE  Negative Final  . Ketones, ur 01/08/2014 NEGATIVE  Negative Final  . Bilirubin Urine 01/08/2014 NEGATIVE  Negative Final  . Hgb urine dipstick 01/08/2014 SMALL* Negative Final  . Urobilinogen, UA 01/08/2014 0.2  0.0 - 1.0 Final  . Leukocytes, UA 01/08/2014 LARGE* Negative Final  . Nitrite 01/08/2014 NEGATIVE  Negative Final  . WBC, UA 01/08/2014 21-50/hpf* 0-2/hpf Final  . RBC / HPF 01/08/2014 0-2/hpf  0-2/hpf Final  . Squamous Epithelial / LPF 01/08/2014 Many(>10/hpf)* Rare(0-4/hpf) Final  . Bacteria, UA 01/08/2014 Many(>50/hpf)* None Final

## 2014-01-08 NOTE — Progress Notes (Signed)
Quick Note:  Kidney test is slightly higher, reduce Janumet to once a day instead of twice a day and call if blood sugars get higher ______

## 2014-01-24 ENCOUNTER — Ambulatory Visit (INDEPENDENT_AMBULATORY_CARE_PROVIDER_SITE_OTHER): Payer: Medicare Other | Admitting: *Deleted

## 2014-01-24 DIAGNOSIS — Z7901 Long term (current) use of anticoagulants: Secondary | ICD-10-CM

## 2014-01-24 DIAGNOSIS — Z5181 Encounter for therapeutic drug level monitoring: Secondary | ICD-10-CM

## 2014-01-24 DIAGNOSIS — I4891 Unspecified atrial fibrillation: Secondary | ICD-10-CM

## 2014-01-24 LAB — POCT INR: INR: 3.2

## 2014-01-25 ENCOUNTER — Encounter: Payer: Self-pay | Admitting: Endocrinology

## 2014-02-19 ENCOUNTER — Other Ambulatory Visit: Payer: Self-pay | Admitting: *Deleted

## 2014-02-19 MED ORDER — WARFARIN SODIUM 5 MG PO TABS: 5.0000 mg | ORAL_TABLET | Freq: Every day | ORAL | Status: DC

## 2014-02-21 ENCOUNTER — Ambulatory Visit (INDEPENDENT_AMBULATORY_CARE_PROVIDER_SITE_OTHER): Payer: Medicare Other | Admitting: Family

## 2014-02-21 DIAGNOSIS — Z5181 Encounter for therapeutic drug level monitoring: Secondary | ICD-10-CM

## 2014-02-21 LAB — POCT INR: INR: 2.1

## 2014-02-21 NOTE — Patient Instructions (Signed)
Continue to take 1 tablet daily except 1/2 tablet on Monday/ Wednesday/Friday.  Re-check in 4 weeks.   Anticoagulation Dose Instructions as of 02/21/2014      Susan SmilesSun Mon Tue Wed Thu Fri Sat   New Dose 5 mg 2.5 mg 5 mg 2.5 mg 5 mg 2.5 mg 5 mg    Description        Continue to take 1 tablet daily except 1/2 tablet on Monday/ Wednesday/Friday.  Re-check in 4 weeks.

## 2014-03-21 ENCOUNTER — Ambulatory Visit (INDEPENDENT_AMBULATORY_CARE_PROVIDER_SITE_OTHER): Payer: Medicare Other

## 2014-03-21 ENCOUNTER — Telehealth: Payer: Self-pay | Admitting: Family

## 2014-03-21 DIAGNOSIS — Z5181 Encounter for therapeutic drug level monitoring: Secondary | ICD-10-CM

## 2014-03-21 DIAGNOSIS — I4891 Unspecified atrial fibrillation: Secondary | ICD-10-CM

## 2014-03-21 LAB — POCT INR: INR: 1.8

## 2014-03-21 NOTE — Telephone Encounter (Signed)
Agree with plan 

## 2014-04-04 ENCOUNTER — Ambulatory Visit (INDEPENDENT_AMBULATORY_CARE_PROVIDER_SITE_OTHER): Payer: Medicare Other

## 2014-04-04 DIAGNOSIS — Z5181 Encounter for therapeutic drug level monitoring: Secondary | ICD-10-CM

## 2014-04-04 LAB — POCT INR: INR: 3

## 2014-05-02 ENCOUNTER — Ambulatory Visit (INDEPENDENT_AMBULATORY_CARE_PROVIDER_SITE_OTHER): Payer: Medicare Other | Admitting: Family Medicine

## 2014-05-02 DIAGNOSIS — Z5181 Encounter for therapeutic drug level monitoring: Secondary | ICD-10-CM

## 2014-05-02 LAB — POCT INR: INR: 2.6

## 2014-05-10 ENCOUNTER — Ambulatory Visit: Payer: Medicare Other | Admitting: Endocrinology

## 2014-05-18 ENCOUNTER — Encounter: Payer: Self-pay | Admitting: Endocrinology

## 2014-05-18 ENCOUNTER — Ambulatory Visit (INDEPENDENT_AMBULATORY_CARE_PROVIDER_SITE_OTHER): Payer: Medicare Other | Admitting: Endocrinology

## 2014-05-18 VITALS — BP 105/62 | HR 92 | Temp 98.2°F | Resp 14 | Ht 66.0 in | Wt 214.0 lb

## 2014-05-18 DIAGNOSIS — E1165 Type 2 diabetes mellitus with hyperglycemia: Secondary | ICD-10-CM

## 2014-05-18 DIAGNOSIS — M810 Age-related osteoporosis without current pathological fracture: Secondary | ICD-10-CM

## 2014-05-18 DIAGNOSIS — E119 Type 2 diabetes mellitus without complications: Secondary | ICD-10-CM

## 2014-05-18 DIAGNOSIS — I952 Hypotension due to drugs: Secondary | ICD-10-CM

## 2014-05-18 DIAGNOSIS — IMO0002 Reserved for concepts with insufficient information to code with codable children: Secondary | ICD-10-CM

## 2014-05-18 LAB — BASIC METABOLIC PANEL
BUN: 12 mg/dL (ref 6–23)
CALCIUM: 9.5 mg/dL (ref 8.4–10.5)
CO2: 26 mEq/L (ref 19–32)
Chloride: 102 mEq/L (ref 96–112)
Creatinine, Ser: 1.22 mg/dL — ABNORMAL HIGH (ref 0.40–1.20)
GFR: 44.41 mL/min — AB (ref >60.00)
Glucose, Bld: 145 mg/dL — ABNORMAL HIGH (ref 70–99)
Potassium: 4 mEq/L (ref 3.5–5.1)
Sodium: 138 mEq/L (ref 135–145)

## 2014-05-18 LAB — HEMOGLOBIN A1C: Hgb A1c MFr Bld: 8.5 % — ABNORMAL HIGH (ref 4.6–6.5)

## 2014-05-18 LAB — GLUCOSE, POCT (MANUAL RESULT ENTRY): POC Glucose: 153 mg/dl — AB (ref 70–99)

## 2014-05-18 LAB — VITAMIN D 25 HYDROXY (VIT D DEFICIENCY, FRACTURES): VITD: 18.26 ng/mL — ABNORMAL LOW (ref 30.00–100.00)

## 2014-05-18 NOTE — Progress Notes (Signed)
Quick Note:  Call patient: Blood sugar control is overall poor with average nearly 190. Need to check the blood sugar at least once daily at various times and reschedule appointment for follow-up in one month Also vitamin D level is very low, need to know how often she is taking the 50,000 units weekly ______

## 2014-05-18 NOTE — Progress Notes (Signed)
Susan Potter 81 y.o.    Reason for Appointment: Complete physical exam and diabetes followup  History of Present Illness   Diagnosis: Type 2 DIABETES MELITUS, date of diagnosis: 2002   She is being seen for follow up of Type 2 diabetes. This has been usually mild and well-controlled  Her A1c generally had been around 7% or less She was  having rather poor control in 7/14 when she ran out of Janumet which she is taking this regularly now Her Janumet was reduced to 1 tablet daily because of creatinine clearance below 50 in 9/15 Also taking 2 mg Amaryl, half tablet at suppertime  She is recently not checking her blood sugar much on her own and is having her family member check it periodically A1c pending No hypoglycemia with low-dose Amaryl Usually compliant with her diet but is not very active  Has been gradually gaining weight again  Glucometer: Accucheck.  Blood Glucose readings: 125-160 , checking in the afternoon or evening randomly  Food preferences: Avoiding sweets, cereal and large portions, getting an egg in am for breakfast. Side effects from medications: None Physical activity: Not much, has back problems   Wt Readings from Last 3 Encounters:  05/18/14 214 lb (97.07 kg)  01/08/14 213 lb (96.616 kg)  10/09/13 210 lb 12.8 oz (95.618 kg)    Lab Results  Component Value Date   HGBA1C 6.9* 01/08/2014   HGBA1C 6.6* 10/06/2013   HGBA1C 6.7* 07/05/2013   Lab Results  Component Value Date   MICROALBUR 1.6 01/08/2014   LDLCALC 65 01/08/2014   CREATININE 1.3* 01/08/2014     HYPERLIPIDEMIA: Excellent control, she has been taking Lipitor daily   Lab Results  Component Value Date   CHOL 147 01/08/2014   HDL 43.30 01/08/2014   LDLCALC 65 01/08/2014   LDLDIRECT 84.6 01/08/2014   TRIG 193.0* 01/08/2014   CHOLHDL 3 01/08/2014        Medication List       This list is accurate as of: 05/18/14 10:26 AM.  Always use your most recent med list.               ACCU-CHEK FASTCLIX LANCETS Misc  by Does not apply route. Check blood sugars once a day     atorvastatin 10 MG tablet  Commonly known as:  LIPITOR  Take 1 tablet (10 mg total) by mouth daily.     bisoprolol-hydrochlorothiazide 10-6.25 MG per tablet  Commonly known as:  ZIAC  Take 1 tablet by mouth daily.     glimepiride 2 MG tablet  Commonly known as:  AMARYL  Take 0.5 tablets (1 mg total) by mouth daily before supper.     glucose blood test strip  Commonly known as:  ACCU-CHEK SMARTVIEW  Use as instructed     glucose blood test strip  One touch Ultra 2 test strips only     lisinopril 5 MG tablet  Commonly known as:  PRINIVIL,ZESTRIL  Take 1 tablet (5 mg total) by mouth daily.     ONE TOUCH ULTRA SYSTEM KIT W/DEVICE Kit  One touch Ultra 2 Glucometer     OVER THE COUNTER MEDICATION  Place 1 drop into both eyes daily as needed (dry eyes/itching).     raloxifene 60 MG tablet  Commonly known as:  EVISTA  Take 60 mg by mouth daily.     sitaGLIPtin-metformin 50-1000 MG per tablet  Commonly known as:  JANUMET  Take 1 tablet by mouth 2 (two) times  daily with a meal.     Vitamin D (Ergocalciferol) 50000 UNITS Caps capsule  Commonly known as:  DRISDOL  Take 50,000 Units by mouth every 7 (seven) days. On Sundays     warfarin 5 MG tablet  Commonly known as:  COUMADIN  Take 1 tablet (5 mg total) by mouth daily.        Allergies: No Known Allergies  Past Medical History  Diagnosis Date  . Retinal vein occlusion   . Atrial fibrillation   . Osteoporosis   . Hypertension   . Diabetes mellitus without complication     No past surgical history on file.  Family History  Problem Relation Age of Onset  . Heart disease Brother     Social History:  reports that she has never smoked. She does not have any smokeless tobacco history on file. She reports that she does not drink alcohol. Her drug history is not on file.  Review of Systems -     HYPERTENSION:    Her  blood pressure is currently low normal. She is on lisinopril 5 mg and 10 mg Ziac   ANTICOAGULATION for atrial fibrillation: Currently followed in Coumadin clinic and last INR was 2.6 She has had long-standing atrial fibrillation without symptoms  Renal insufficiency: Creatinine level has been somewhat variable  Lab Results  Component Value Date   CREATININE 1.3* 01/08/2014   Foot exam in 9/15 showed Monofilament sensation in toes is normal        Vibration sense at toes is moderately reduced bilaterally.  Reflexes  absent bilaterally at ankles    Examination:   BP 130/89 mmHg  Pulse 92  Temp(Src) 98.2 F (36.8 C)  Resp 14  Ht 5' 6" (1.676 m)  Wt 214 lb (97.07 kg)  BMI 34.56 kg/m2  SpO2 95%  Body mass index is 34.56 kg/(m^2).   Standing blood pressure 105/62 with large cuff No pedal edema    Assesment/Plan:   1. Diabetes type 2, with  obesity   The patient's diabetes is appearing controlled and she is  compliant with her Janumet and low dose Amaryl  No symptoms of hypoglycemia reported Need to check her A1c again today She was given instructions on how to use her home monitor and she will start checking periodically again Encouraged her to be more active to keep her weight down  2. Long-standing atrial fibrillation: Asymptomatic and her heart rate is mildly increased even with 10 mg bisoprolol   3. Blood pressure is low normal and she can stop lisinopril   PREVENTIVE care:  She will get Prevnar on her next visit as it is not available today     KUMAR,AJAY 05/18/2014, 10:26 AM

## 2014-05-18 NOTE — Patient Instructions (Signed)
Stop Lisinopril  Please check blood sugars at least half the time about 2 hours after any meal and 3 times per week on waking up. Please bring blood sugar monitor to each visit. Recommended blood sugar levels about 2 hours after meal is 140-180 and on waking up 90-130

## 2014-05-25 ENCOUNTER — Other Ambulatory Visit: Payer: Self-pay | Admitting: *Deleted

## 2014-05-25 MED ORDER — VITAMIN D (ERGOCALCIFEROL) 1.25 MG (50000 UNIT) PO CAPS: 50000.0000 [IU] | ORAL_CAPSULE | ORAL | Status: DC

## 2014-05-30 ENCOUNTER — Ambulatory Visit (INDEPENDENT_AMBULATORY_CARE_PROVIDER_SITE_OTHER): Payer: Medicare Other | Admitting: General Practice

## 2014-05-30 DIAGNOSIS — Z5181 Encounter for therapeutic drug level monitoring: Secondary | ICD-10-CM

## 2014-05-30 LAB — POCT INR: INR: 2.3

## 2014-05-30 NOTE — Progress Notes (Signed)
Pre visit review using our clinic review tool, if applicable. No additional management support is needed unless otherwise documented below in the visit note. 

## 2014-05-30 NOTE — Progress Notes (Signed)
Agree with plan 

## 2014-06-21 ENCOUNTER — Other Ambulatory Visit: Payer: Self-pay | Admitting: *Deleted

## 2014-06-21 MED ORDER — ATORVASTATIN CALCIUM 10 MG PO TABS: 10.0000 mg | ORAL_TABLET | Freq: Every day | ORAL | Status: DC

## 2014-06-21 MED ORDER — GLIMEPIRIDE 2 MG PO TABS: 1.0000 mg | ORAL_TABLET | Freq: Every day | ORAL | Status: DC

## 2014-07-09 ENCOUNTER — Telehealth: Payer: Self-pay | Admitting: Internal Medicine

## 2014-07-09 NOTE — Telephone Encounter (Signed)
Pt needs refill on memethimazole 5mg 

## 2014-07-11 ENCOUNTER — Ambulatory Visit (INDEPENDENT_AMBULATORY_CARE_PROVIDER_SITE_OTHER): Payer: Medicare Other | Admitting: General Practice

## 2014-07-11 DIAGNOSIS — I4891 Unspecified atrial fibrillation: Secondary | ICD-10-CM | POA: Diagnosis not present

## 2014-07-11 DIAGNOSIS — Z5181 Encounter for therapeutic drug level monitoring: Secondary | ICD-10-CM

## 2014-07-11 LAB — POCT INR: INR: 2.2

## 2014-07-11 NOTE — Progress Notes (Signed)
Pre visit review using our clinic review tool, if applicable. No additional management support is needed unless otherwise documented below in the visit note. 

## 2014-07-11 NOTE — Progress Notes (Signed)
Agree with plan 

## 2014-07-12 ENCOUNTER — Other Ambulatory Visit (INDEPENDENT_AMBULATORY_CARE_PROVIDER_SITE_OTHER): Payer: Medicare Other

## 2014-07-12 DIAGNOSIS — E119 Type 2 diabetes mellitus without complications: Secondary | ICD-10-CM | POA: Diagnosis not present

## 2014-07-12 DIAGNOSIS — Z5181 Encounter for therapeutic drug level monitoring: Secondary | ICD-10-CM

## 2014-07-12 LAB — COMPREHENSIVE METABOLIC PANEL
ALK PHOS: 55 U/L (ref 39–117)
ALT: 9 U/L (ref 0–35)
AST: 13 U/L (ref 0–37)
Albumin: 3.9 g/dL (ref 3.5–5.2)
BUN: 11 mg/dL (ref 6–23)
CO2: 31 mEq/L (ref 19–32)
Calcium: 9.5 mg/dL (ref 8.4–10.5)
Chloride: 103 mEq/L (ref 96–112)
Creatinine, Ser: 1.31 mg/dL — ABNORMAL HIGH (ref 0.40–1.20)
GFR: 40.9 mL/min — ABNORMAL LOW (ref >60.00)
Glucose, Bld: 175 mg/dL — ABNORMAL HIGH (ref 70–99)
POTASSIUM: 3.8 meq/L (ref 3.5–5.1)
SODIUM: 140 meq/L (ref 135–145)
TOTAL PROTEIN: 7.5 g/dL (ref 6.0–8.3)
Total Bilirubin: 0.9 mg/dL (ref 0.2–1.2)

## 2014-07-13 ENCOUNTER — Other Ambulatory Visit: Payer: Medicare Other

## 2014-07-18 ENCOUNTER — Encounter: Payer: Self-pay | Admitting: Endocrinology

## 2014-07-18 ENCOUNTER — Ambulatory Visit (INDEPENDENT_AMBULATORY_CARE_PROVIDER_SITE_OTHER): Payer: Medicare Other | Admitting: Endocrinology

## 2014-07-18 VITALS — BP 118/64 | HR 86 | Temp 98.0°F | Resp 12 | Wt 217.0 lb

## 2014-07-18 DIAGNOSIS — IMO0002 Reserved for concepts with insufficient information to code with codable children: Secondary | ICD-10-CM

## 2014-07-18 DIAGNOSIS — N183 Chronic kidney disease, stage 3 (moderate): Secondary | ICD-10-CM | POA: Diagnosis not present

## 2014-07-18 DIAGNOSIS — Z23 Encounter for immunization: Secondary | ICD-10-CM | POA: Diagnosis not present

## 2014-07-18 DIAGNOSIS — N1832 Chronic kidney disease, stage 3b: Secondary | ICD-10-CM

## 2014-07-18 DIAGNOSIS — E1165 Type 2 diabetes mellitus with hyperglycemia: Secondary | ICD-10-CM

## 2014-07-18 NOTE — Patient Instructions (Addendum)
Glimeperide full tablet daily at supper  Please check blood sugars at least half the time about 2 hours after any meal and 3 times per week on waking up. Please bring blood sugar monitor to each visit. Recommended blood sugar levels about 2 hours after meal is 140-180 and on waking up 90-130  Walk as much as possible

## 2014-07-18 NOTE — Progress Notes (Signed)
Susan Potter 81 y.o.    Reason for Appointment: diabetes followup  History of Present Illness   Diagnosis: Type 2 DIABETES MELITUS, date of diagnosis: 2002   She is being seen for follow up of Type 2 diabetes. This has been usually mild and well-controlled  Her A1c generally had been around 7% or less Her Janumet was reduced to 1 tablet daily because of creatinine clearance below 50 in 9/15 Also taking 2 mg Amaryl, half tablet at suppertime  Recent history: She is having worsening of her blood sugar control as seen by her A1c of 8.5% in 1/60 She thinks she is compliant with all her medications as directed and not clear why her glucose readings are higher On her last visit she was not checking her blood sugar at home much and now has gone back to testing  However her blood sugars appear to be consistently high at home also She does not think she has changed her diet; also her fasting readings are significantly high Unable to do consistent exercise because of periodic back pain Also is gaining weight gradually  Glucometer: One Touch.  Blood Glucose readings:  PRE-MEAL  morning   PCB  Dinner Bedtime Overall  Glucose range:  156-221   267   164   160    Median:  165      168    Food preferences: Avoiding sweets, cereal and large portions, getting an egg in am for breakfast.  Eating more fruit during the day Side effects from medications: None Physical activity: Walking at times; has back problems   Wt Readings from Last 3 Encounters:  07/18/14 217 lb (98.431 kg)  05/18/14 214 lb (97.07 kg)  01/08/14 213 lb (96.616 kg)    Lab Results  Component Value Date   HGBA1C 8.5* 05/18/2014   HGBA1C 6.9* 01/08/2014   HGBA1C 6.6* 10/06/2013   Lab Results  Component Value Date   MICROALBUR 1.6 01/08/2014   LDLCALC 65 01/08/2014   CREATININE 1.31* 07/12/2014     HYPERLIPIDEMIA: Excellent control, she has been taking Lipitor daily   Lab Results  Component Value Date   CHOL  147 01/08/2014   HDL 43.30 01/08/2014   LDLCALC 65 01/08/2014   LDLDIRECT 84.6 01/08/2014   TRIG 193.0* 01/08/2014   CHOLHDL 3 01/08/2014        Medication List       This list is accurate as of: 07/18/14  8:20 AM.  Always use your most recent med list.               ACCU-CHEK FASTCLIX LANCETS Misc  by Does not apply route. Check blood sugars once a day     atorvastatin 10 MG tablet  Commonly known as:  LIPITOR  Take 1 tablet (10 mg total) by mouth daily.     bisoprolol-hydrochlorothiazide 10-6.25 MG per tablet  Commonly known as:  ZIAC  Take 1 tablet by mouth daily.     glimepiride 2 MG tablet  Commonly known as:  AMARYL  Take 0.5 tablets (1 mg total) by mouth daily before supper.     glucose blood test strip  Commonly known as:  ACCU-CHEK SMARTVIEW  Use as instructed     glucose blood test strip  One touch Ultra 2 test strips only     ONE TOUCH ULTRA SYSTEM KIT W/DEVICE Kit  One touch Ultra 2 Glucometer     OVER THE COUNTER MEDICATION  Place 1 drop into both eyes daily  as needed (dry eyes/itching).     raloxifene 60 MG tablet  Commonly known as:  EVISTA  Take 60 mg by mouth daily.     sitaGLIPtin-metformin 50-1000 MG per tablet  Commonly known as:  JANUMET  Take 1 tablet by mouth 2 (two) times daily with a meal.     Vitamin D (Ergocalciferol) 50000 UNITS Caps capsule  Commonly known as:  DRISDOL  Take 1 capsule (50,000 Units total) by mouth every 7 (seven) days. On Sundays     warfarin 5 MG tablet  Commonly known as:  COUMADIN  Take 1 tablet (5 mg total) by mouth daily.        Allergies: No Known Allergies  Past Medical History  Diagnosis Date  . Retinal vein occlusion   . Atrial fibrillation   . Osteoporosis   . Hypertension   . Diabetes mellitus without complication     No past surgical history on file.  Family History  Problem Relation Age of Onset  . Heart disease Brother     Social History:  reports that she has never smoked.  She does not have any smokeless tobacco history on file. She reports that she does not drink alcohol. Her drug history is not on file.  Review of Systems -     HYPERTENSION:    Her blood pressure is currently  normal. She is on10 mg Ziac and lisinopril was stopped on the last visit when blood pressure was relatively low  ANTICOAGULATION for atrial fibrillation: Currently followed in Coumadin clinic and last INR was 2.2 She has had long-standing atrial fibrillation without symptoms  Renal insufficiency: Creatinine level has been somewhat variable, mildly increased now  Lab Results  Component Value Date   CREATININE 1.31* 07/12/2014   Foot exam in 9/15 showed Monofilament sensation in toes is normal        Vibration sense at toes is moderately reduced bilaterally.  Reflexes  absent bilaterally at ankles  Vitamin D deficiency: She is back on her supplement weekly    Examination:   BP 118/64 mmHg  Pulse 86  Temp(Src) 98 F (36.7 C) (Oral)  Resp 12  Wt 217 lb (98.431 kg)  SpO2 97%  Body mass index is 35.04 kg/(m^2).   No pedal edema    Assesment/Plan:   1. Diabetes type 2, with  obesity   The patient's diabetes is appearing poorly controlled and her last A1c was 8.5 She is reasonably good with her diet but is not able to exercise much and has gained some weight Since her Janumet cannot be increased with her renal function she will go to the full tablet of Amaryl 2 mg at suppertime Advised her to call if blood sugars are not improved Will follow-up in 2 months  Encouraged her to be more active to keep her weight down  2. Long-standing atrial fibrillation: Asymptomatic and her heart rate is Reasonably controlled  3. Blood pressure is well-controlled    PREVENTIVE care:  She will get Prevnar today     Daylan Boggess 07/18/2014, 8:20 AM

## 2014-07-23 ENCOUNTER — Other Ambulatory Visit: Payer: Self-pay | Admitting: *Deleted

## 2014-07-23 MED ORDER — BISOPROLOL-HYDROCHLOROTHIAZIDE 10-6.25 MG PO TABS: 1.0000 | ORAL_TABLET | Freq: Every day | ORAL | Status: DC

## 2014-08-20 ENCOUNTER — Other Ambulatory Visit: Payer: Self-pay | Admitting: *Deleted

## 2014-08-20 MED ORDER — SITAGLIPTIN PHOS-METFORMIN HCL 50-1000 MG PO TABS: 1.0000 | ORAL_TABLET | Freq: Two times a day (BID) | ORAL | Status: DC

## 2014-08-22 ENCOUNTER — Ambulatory Visit (INDEPENDENT_AMBULATORY_CARE_PROVIDER_SITE_OTHER): Payer: Medicare Other | Admitting: General Practice

## 2014-08-22 DIAGNOSIS — I4891 Unspecified atrial fibrillation: Secondary | ICD-10-CM

## 2014-08-22 DIAGNOSIS — Z5181 Encounter for therapeutic drug level monitoring: Secondary | ICD-10-CM | POA: Diagnosis not present

## 2014-08-22 LAB — POCT INR: INR: 1.5

## 2014-08-22 NOTE — Progress Notes (Signed)
Agree with plan 

## 2014-08-22 NOTE — Progress Notes (Signed)
Pre visit review using our clinic review tool, if applicable. No additional management support is needed unless otherwise documented below in the visit note. 

## 2014-09-12 ENCOUNTER — Other Ambulatory Visit (INDEPENDENT_AMBULATORY_CARE_PROVIDER_SITE_OTHER): Payer: Medicare Other

## 2014-09-12 ENCOUNTER — Other Ambulatory Visit: Payer: Self-pay | Admitting: *Deleted

## 2014-09-12 DIAGNOSIS — IMO0002 Reserved for concepts with insufficient information to code with codable children: Secondary | ICD-10-CM

## 2014-09-12 DIAGNOSIS — E1165 Type 2 diabetes mellitus with hyperglycemia: Secondary | ICD-10-CM

## 2014-09-12 DIAGNOSIS — Z5181 Encounter for therapeutic drug level monitoring: Secondary | ICD-10-CM

## 2014-09-12 LAB — URINALYSIS, ROUTINE W REFLEX MICROSCOPIC
Bilirubin Urine: NEGATIVE
Ketones, ur: NEGATIVE
LEUKOCYTES UA: NEGATIVE
Nitrite: NEGATIVE
SPECIFIC GRAVITY, URINE: 1.025 (ref 1.000–1.030)
URINE GLUCOSE: NEGATIVE
UROBILINOGEN UA: 0.2 (ref 0.0–1.0)
pH: 5.5 (ref 5.0–8.0)

## 2014-09-12 LAB — MICROALBUMIN / CREATININE URINE RATIO
Creatinine,U: 269.6 mg/dL
Microalb Creat Ratio: 2.4 mg/g (ref 0.0–30.0)
Microalb, Ur: 6.6 mg/dL — ABNORMAL HIGH (ref 0.0–1.9)

## 2014-09-12 LAB — LIPID PANEL
CHOL/HDL RATIO: 3
Cholesterol: 117 mg/dL (ref 0–200)
HDL: 39.1 mg/dL (ref >39.00)
LDL Cholesterol: 53 mg/dL (ref 0–99)
NonHDL: 77.9
Triglycerides: 123 mg/dL (ref 0.0–149.0)
VLDL: 24.6 mg/dL (ref 0.0–40.0)

## 2014-09-12 LAB — COMPREHENSIVE METABOLIC PANEL
ALT: 8 U/L (ref 0–35)
AST: 12 U/L (ref 0–37)
Albumin: 3.9 g/dL (ref 3.5–5.2)
Alkaline Phosphatase: 49 U/L (ref 39–117)
BILIRUBIN TOTAL: 0.9 mg/dL (ref 0.2–1.2)
BUN: 14 mg/dL (ref 6–23)
CHLORIDE: 104 meq/L (ref 96–112)
CO2: 27 meq/L (ref 19–32)
CREATININE: 1.12 mg/dL (ref 0.40–1.20)
Calcium: 9.5 mg/dL (ref 8.4–10.5)
GFR: 48.98 mL/min — AB (ref >60.00)
Glucose, Bld: 186 mg/dL — ABNORMAL HIGH (ref 70–99)
POTASSIUM: 4.3 meq/L (ref 3.5–5.1)
Sodium: 138 mEq/L (ref 135–145)
TOTAL PROTEIN: 7.3 g/dL (ref 6.0–8.3)

## 2014-09-12 LAB — HEMOGLOBIN A1C: HEMOGLOBIN A1C: 7.3 % — AB (ref 4.6–6.5)

## 2014-09-12 MED ORDER — GLIMEPIRIDE 2 MG PO TABS: 1.0000 mg | ORAL_TABLET | Freq: Every day | ORAL | Status: DC

## 2014-09-18 ENCOUNTER — Encounter: Payer: Self-pay | Admitting: Endocrinology

## 2014-09-18 ENCOUNTER — Ambulatory Visit (INDEPENDENT_AMBULATORY_CARE_PROVIDER_SITE_OTHER): Payer: Medicare Other | Admitting: Endocrinology

## 2014-09-18 VITALS — BP 118/78 | HR 76 | Temp 97.9°F | Resp 16 | Ht 66.0 in | Wt 212.0 lb

## 2014-09-18 DIAGNOSIS — I482 Chronic atrial fibrillation, unspecified: Secondary | ICD-10-CM

## 2014-09-18 DIAGNOSIS — E1165 Type 2 diabetes mellitus with hyperglycemia: Secondary | ICD-10-CM

## 2014-09-18 DIAGNOSIS — E559 Vitamin D deficiency, unspecified: Secondary | ICD-10-CM | POA: Diagnosis not present

## 2014-09-18 DIAGNOSIS — IMO0002 Reserved for concepts with insufficient information to code with codable children: Secondary | ICD-10-CM

## 2014-09-18 NOTE — Progress Notes (Addendum)
Susan Potter 81 y.o.    Reason for Appointment: diabetes followup  History of Present Illness   Diagnosis: Type 2 DIABETES MELITUS, date of diagnosis: 2002   She is being seen for follow up of Type 2 diabetes. This has been usually mild and well-controlled  Her A1c generally had been around 7% or less Her Janumet was reduced to 1 tablet daily because of creatinine clearance below 50 in 9/15 Also taking 2 mg Amaryl, half tablet at suppertime  Recent history: She was having worsening of her blood sugar control as seen by her A1c of 8.5% in 04/2014 Her Amaryl was increased to 2 mg and she was also asked to work on her diet and weight Although she has not done more physical activity she has lost some weight and is probably watching the types of foods she is eating better Despite reminders she is checking her blood sugar only occasionally and mostly fasting and none after meals recently Unable to do consistent exercise because of periodic back pain  Glucometer: One Touch.  Blood Glucose readings: Has only 6 readings in the last month  PRE-MEAL Breakfast Lunch Dinner Bedtime Overall  Glucose range:  139-156       Mean/median:  150         Food preferences: Recently sweets more consistently, getting an egg in am for breakfast.  Eating more fruit during the day Side effects from medications: None Physical activity: Walking at times; has back problems   Wt Readings from Last 3 Encounters:  09/18/14 212 lb (96.163 kg)  07/18/14 217 lb (98.431 kg)  05/18/14 214 lb (97.07 kg)    Lab Results  Component Value Date   HGBA1C 7.3* 09/12/2014   HGBA1C 8.5* 05/18/2014   HGBA1C 6.9* 01/08/2014   Lab Results  Component Value Date   MICROALBUR 6.6* 09/12/2014   LDLCALC 53 09/12/2014   CREATININE 1.12 09/12/2014     HYPERLIPIDEMIA: Excellent control, she has been taking Lipitor daily   Lab Results  Component Value Date   CHOL 117 09/12/2014   HDL 39.10 09/12/2014   LDLCALC 53  09/12/2014   LDLDIRECT 84.6 01/08/2014   TRIG 123.0 09/12/2014   CHOLHDL 3 09/12/2014        Medication List       This list is accurate as of: 09/18/14  9:49 AM.  Always use your most recent med list.               atorvastatin 10 MG tablet  Commonly known as:  LIPITOR  Take 1 tablet (10 mg total) by mouth daily.     bisoprolol-hydrochlorothiazide 10-6.25 MG per tablet  Commonly known as:  ZIAC  Take 1 tablet by mouth daily.     glimepiride 2 MG tablet  Commonly known as:  AMARYL  Take 0.5 tablets (1 mg total) by mouth daily before supper.     glucose blood test strip  One touch Ultra 2 test strips only     ONE TOUCH ULTRA SYSTEM KIT W/DEVICE Kit  One touch Ultra 2 Glucometer     OVER THE COUNTER MEDICATION  Place 1 drop into both eyes daily as needed (dry eyes/itching).     raloxifene 60 MG tablet  Commonly known as:  EVISTA  Take 60 mg by mouth daily.     sitaGLIPtin-metformin 50-1000 MG per tablet  Commonly known as:  JANUMET  Take 1 tablet by mouth 2 (two) times daily with a meal.  Vitamin D (Ergocalciferol) 50000 UNITS Caps capsule  Commonly known as:  DRISDOL  Take 1 capsule (50,000 Units total) by mouth every 7 (seven) days. On Sundays     warfarin 5 MG tablet  Commonly known as:  COUMADIN  Take 1 tablet (5 mg total) by mouth daily.        Allergies: No Known Allergies  Past Medical History  Diagnosis Date  . Retinal vein occlusion   . Atrial fibrillation   . Osteoporosis   . Hypertension   . Diabetes mellitus without complication     No past surgical history on file.  Family History  Problem Relation Age of Onset  . Heart disease Brother     Social History:  reports that she has never smoked. She does not have any smokeless tobacco history on file. She reports that she does not drink alcohol. Her drug history is not on file.  Review of Systems -   History of osteoporosis: This had stabilized and improved with taking Evista and  her last bone density had shown improvement in 2011 2 Her vitamin D level has been supplemented since 1/16 with 50,000 units weekly     HYPERTENSION:    Her blood pressure is  normal. She is on10 mg Ziac and lisinopril was stopped on the  visit when blood pressure was relatively low  BP Readings from Last 3 Encounters:  09/18/14 118/78  07/18/14 118/64  05/18/14 105/62    ANTICOAGULATION for atrial fibrillation: Currently followed in Coumadin clinic and last INR was 2.2 She has had long-standing atrial fibrillation without symptoms  Renal insufficiency: Creatinine level has been somewhat variable, mildly increased now  Lab Results  Component Value Date   CREATININE 1.12 09/12/2014   Foot exam in 9/15 showed Monofilament sensation in toes is normal        Vibration sense at toes is moderately reduced bilaterally.  Reflexes  absent bilaterally at ankles  Vitamin D deficiency: She is back on her supplement weekly      Examination:   BP 118/78 mmHg  Pulse 76  Temp(Src) 97.9 F (36.6 C)  Resp 16  Ht '5\' 6"'  (1.676 m)  Wt 212 lb (96.163 kg)  BMI 34.23 kg/m2  SpO2 98%  Body mass index is 34.23 kg/(m^2).   No pedal edema   Heart rate is about 76, mild irregularity  Assesment/Plan:   1. Diabetes type 2, with  obesity   The patient's diabetes is appearing somewhat better controlled and A1c is now improving; previously 8.5 She has mild increase in fasting blood sugars but overall control is adequate for her age She is doing well with Janumet and 2 mg Amaryl without hypoglycemia Discussed needing to be more active to continue keeping her weight down and given her chair exercises to do today She will also try to be consistent with diet  2. Long-standing atrial fibrillation: Asymptomatic and her heart rate is controlled with beta blocker  3. Blood pressure is well-controlled on Ziac     Patient Instructions  Check blood sugars on waking up .Marland Kitchen 2-3 .Marland Kitchen times a week Also  check blood sugars about 2 hours after a meal and do this after different meals by rotation Recommended blood sugar levels on waking up is 90-130 and about 2 hours after meal is 140-180 Please bring blood sugar monitor to each visit.  Chair exercises        Susan Potter 09/18/2014, 9:49 AM

## 2014-09-18 NOTE — Patient Instructions (Signed)
Check blood sugars on waking up .Marland Kitchen. 2-3 .Marland Kitchen. times a week Also check blood sugars about 2 hours after a meal and do this after different meals by rotation Recommended blood sugar levels on waking up is 90-130 and about 2 hours after meal is 140-180 Please bring blood sugar monitor to each visit.  Chair exercises

## 2014-09-19 ENCOUNTER — Ambulatory Visit (INDEPENDENT_AMBULATORY_CARE_PROVIDER_SITE_OTHER): Payer: Medicare Other | Admitting: General Practice

## 2014-09-19 DIAGNOSIS — I4891 Unspecified atrial fibrillation: Secondary | ICD-10-CM

## 2014-09-19 DIAGNOSIS — Z5181 Encounter for therapeutic drug level monitoring: Secondary | ICD-10-CM

## 2014-09-19 LAB — POCT INR: INR: 2.1

## 2014-09-19 NOTE — Progress Notes (Signed)
Pre visit review using our clinic review tool, if applicable. No additional management support is needed unless otherwise documented below in the visit note. 

## 2014-09-19 NOTE — Progress Notes (Signed)
I have reviewed and agree with the plan. 

## 2014-09-26 ENCOUNTER — Other Ambulatory Visit: Payer: Self-pay | Admitting: *Deleted

## 2014-09-26 MED ORDER — VITAMIN D (ERGOCALCIFEROL) 1.25 MG (50000 UNIT) PO CAPS: 50000.0000 [IU] | ORAL_CAPSULE | ORAL | Status: DC

## 2014-10-16 ENCOUNTER — Other Ambulatory Visit: Payer: Self-pay | Admitting: *Deleted

## 2014-10-16 MED ORDER — WARFARIN SODIUM 5 MG PO TABS: 5.0000 mg | ORAL_TABLET | Freq: Every day | ORAL | Status: DC

## 2014-10-17 ENCOUNTER — Ambulatory Visit: Payer: Medicare Other

## 2014-10-17 ENCOUNTER — Ambulatory Visit (INDEPENDENT_AMBULATORY_CARE_PROVIDER_SITE_OTHER): Payer: Medicare Other | Admitting: General Practice

## 2014-10-17 DIAGNOSIS — I4891 Unspecified atrial fibrillation: Secondary | ICD-10-CM

## 2014-10-17 DIAGNOSIS — Z5181 Encounter for therapeutic drug level monitoring: Secondary | ICD-10-CM

## 2014-10-17 LAB — POCT INR: INR: 1.3

## 2014-10-17 NOTE — Progress Notes (Signed)
Pre visit review using our clinic review tool, if applicable. No additional management support is needed unless otherwise documented below in the visit note. 

## 2014-10-22 NOTE — Progress Notes (Signed)
I have reviewed and agree with the plan. 

## 2014-11-14 ENCOUNTER — Ambulatory Visit (INDEPENDENT_AMBULATORY_CARE_PROVIDER_SITE_OTHER): Payer: Medicare Other | Admitting: General Practice

## 2014-11-14 DIAGNOSIS — I4891 Unspecified atrial fibrillation: Secondary | ICD-10-CM

## 2014-11-14 DIAGNOSIS — Z5181 Encounter for therapeutic drug level monitoring: Secondary | ICD-10-CM | POA: Diagnosis not present

## 2014-11-14 LAB — POCT INR: INR: 2

## 2014-11-14 NOTE — Progress Notes (Signed)
I have reviewed and agree with the plan. 

## 2014-11-14 NOTE — Progress Notes (Signed)
Pre visit review using our clinic review tool, if applicable. No additional management support is needed unless otherwise documented below in the visit note. 

## 2014-11-20 ENCOUNTER — Encounter: Payer: Self-pay | Admitting: *Deleted

## 2014-11-20 LAB — HM DIABETES EYE EXAM

## 2014-12-12 ENCOUNTER — Ambulatory Visit (INDEPENDENT_AMBULATORY_CARE_PROVIDER_SITE_OTHER): Payer: Medicare Other | Admitting: General Practice

## 2014-12-12 DIAGNOSIS — I4891 Unspecified atrial fibrillation: Secondary | ICD-10-CM | POA: Diagnosis not present

## 2014-12-12 DIAGNOSIS — Z5181 Encounter for therapeutic drug level monitoring: Secondary | ICD-10-CM

## 2014-12-12 LAB — POCT INR: INR: 1.7

## 2014-12-12 NOTE — Progress Notes (Signed)
I have reviewed and agree with the plan. 

## 2014-12-12 NOTE — Progress Notes (Signed)
Pre visit review using our clinic review tool, if applicable. No additional management support is needed unless otherwise documented below in the visit note. 

## 2014-12-13 ENCOUNTER — Other Ambulatory Visit: Payer: Medicare Other

## 2014-12-18 ENCOUNTER — Ambulatory Visit (INDEPENDENT_AMBULATORY_CARE_PROVIDER_SITE_OTHER): Payer: Medicare Other | Admitting: Endocrinology

## 2014-12-18 ENCOUNTER — Encounter: Payer: Self-pay | Admitting: Endocrinology

## 2014-12-18 VITALS — BP 118/74 | HR 81 | Temp 98.2°F | Resp 16 | Ht 66.0 in | Wt 215.6 lb

## 2014-12-18 DIAGNOSIS — I10 Essential (primary) hypertension: Secondary | ICD-10-CM

## 2014-12-18 DIAGNOSIS — E1165 Type 2 diabetes mellitus with hyperglycemia: Secondary | ICD-10-CM | POA: Diagnosis not present

## 2014-12-18 DIAGNOSIS — N182 Chronic kidney disease, stage 2 (mild): Secondary | ICD-10-CM | POA: Diagnosis not present

## 2014-12-18 DIAGNOSIS — E559 Vitamin D deficiency, unspecified: Secondary | ICD-10-CM | POA: Diagnosis not present

## 2014-12-18 DIAGNOSIS — I482 Chronic atrial fibrillation, unspecified: Secondary | ICD-10-CM

## 2014-12-18 DIAGNOSIS — IMO0002 Reserved for concepts with insufficient information to code with codable children: Secondary | ICD-10-CM

## 2014-12-18 LAB — COMPREHENSIVE METABOLIC PANEL
ALT: 8 U/L (ref 0–35)
AST: 12 U/L (ref 0–37)
Albumin: 4.1 g/dL (ref 3.5–5.2)
Alkaline Phosphatase: 54 U/L (ref 39–117)
BILIRUBIN TOTAL: 0.8 mg/dL (ref 0.2–1.2)
BUN: 19 mg/dL (ref 6–23)
CO2: 29 meq/L (ref 19–32)
Calcium: 10 mg/dL (ref 8.4–10.5)
Chloride: 101 mEq/L (ref 96–112)
Creatinine, Ser: 1.27 mg/dL — ABNORMAL HIGH (ref 0.40–1.20)
GFR: 42.34 mL/min — ABNORMAL LOW (ref >60.00)
GLUCOSE: 154 mg/dL — AB (ref 70–99)
Potassium: 4.5 mEq/L (ref 3.5–5.1)
SODIUM: 139 meq/L (ref 135–145)
Total Protein: 7.8 g/dL (ref 6.0–8.3)

## 2014-12-18 LAB — VITAMIN D 25 HYDROXY (VIT D DEFICIENCY, FRACTURES): VITD: 40.62 ng/mL (ref 30.00–100.00)

## 2014-12-18 LAB — MICROALBUMIN / CREATININE URINE RATIO
Creatinine,U: 67.4 mg/dL
MICROALB UR: 1 mg/dL (ref 0.0–1.9)
Microalb Creat Ratio: 1.5 mg/g (ref 0.0–30.0)

## 2014-12-18 LAB — POCT GLYCOSYLATED HEMOGLOBIN (HGB A1C): HEMOGLOBIN A1C: 7.1

## 2014-12-18 LAB — HEMOGLOBIN A1C: Hgb A1c MFr Bld: 7 % — ABNORMAL HIGH (ref 4.6–6.5)

## 2014-12-18 NOTE — Progress Notes (Signed)
Quick Note:  Please let patient know that the kidney test is slightly higher, no increase in Janumet needed but increase glimepiride 2 mg to 1-1/2 tablets at dinnertime, new prescription needed  ______

## 2014-12-18 NOTE — Progress Notes (Signed)
Susan Potter 81 y.o.    Reason for Appointment: diabetes followup  History of Present Illness   Diagnosis: Type 2 DIABETES MELITUS, date of diagnosis: 2002   She is being seen for follow up of Type 2 diabetes. This has been usually mild and well-controlled  Her A1c generally had been around 7% or less Her Janumet was reduced to 1 tablet daily because of creatinine clearance below 50 in 9/15 Also taking 2 mg Amaryl, half tablet at suppertime  Recent history: Her Amaryl was increased to 2 mg in 1/16 when her A1c was up to 8.5%; and she was also asked to work on her diet She is usually not checking her blood sugar much at home and has only a few readings randomly in the morning in the last week or so Surprisingly her A1c is not any higher than before Unable to do much walking for exercise because of periodic back pain but she thinks she is doing some chair exercises that were given However has gained back some of the weight she had lost  Glucometer: One Touch.  Blood Glucose readings: Has only 4 readings in the last month, ranging from 147-184, a couple of them after breakfast  Food preferences: getting an egg in am for breakfast.  Eating more fruit during the day, occasionally sweets Side effects from medications: None Physical activity: Walking a little; has back problems   Wt Readings from Last 3 Encounters:  12/18/14 215 lb 9.6 oz (97.796 kg)  09/18/14 212 lb (96.163 kg)  07/18/14 217 lb (98.431 kg)    Lab Results  Component Value Date   HGBA1C 7.1 12/18/2014   HGBA1C 7.3* 09/12/2014   HGBA1C 8.5* 05/18/2014   Lab Results  Component Value Date   MICROALBUR 6.6* 09/12/2014   LDLCALC 53 09/12/2014   CREATININE 1.12 09/12/2014     HYPERLIPIDEMIA: Excellent control, she has been taking Lipitor    Lab Results  Component Value Date   CHOL 117 09/12/2014   HDL 39.10 09/12/2014   LDLCALC 53 09/12/2014   LDLDIRECT 84.6 01/08/2014   TRIG 123.0 09/12/2014   CHOLHDL 3  09/12/2014        Medication List       This list is accurate as of: 12/18/14  8:35 AM.  Always use your most recent med list.               atorvastatin 10 MG tablet  Commonly known as:  LIPITOR  Take 1 tablet (10 mg total) by mouth daily.     bisoprolol-hydrochlorothiazide 10-6.25 MG per tablet  Commonly known as:  ZIAC  Take 1 tablet by mouth daily.     glimepiride 2 MG tablet  Commonly known as:  AMARYL  Take 0.5 tablets (1 mg total) by mouth daily before supper.     glucose blood test strip  One touch Ultra 2 test strips only     ONE TOUCH ULTRA SYSTEM KIT W/DEVICE Kit  One touch Ultra 2 Glucometer     OVER THE COUNTER MEDICATION  Place 1 drop into both eyes daily as needed (dry eyes/itching).     raloxifene 60 MG tablet  Commonly known as:  EVISTA  Take 60 mg by mouth daily.     sitaGLIPtin-metformin 50-1000 MG per tablet  Commonly known as:  JANUMET  Take 1 tablet by mouth 2 (two) times daily with a meal.     Vitamin D (Ergocalciferol) 50000 UNITS Caps capsule  Commonly known as:  DRISDOL  Take 1 capsule (50,000 Units total) by mouth every 7 (seven) days. On Sundays     warfarin 5 MG tablet  Commonly known as:  COUMADIN  Take 1 tablet (5 mg total) by mouth daily.        Allergies: No Known Allergies  Past Medical History  Diagnosis Date  . Retinal vein occlusion   . Atrial fibrillation   . Osteoporosis   . Hypertension   . Diabetes mellitus without complication     No past surgical history on file.  Family History  Problem Relation Age of Onset  . Heart disease Brother     Social History:  reports that she has never smoked. She does not have any smokeless tobacco history on file. She reports that she does not drink alcohol. Her drug history is not on file.  Review of Systems -   History of osteoporosis: This had stabilized and improved with taking Evista and her last bone density had shown improvement in 2011 2 Her vitamin D level has  been supplemented since 1/16 with 50,000 units weekly     HYPERTENSION:    Her blood pressure is  normal. She is on10 mg Ziac and lisinopril was stopped on the  visit when blood pressure was relatively low  BP Readings from Last 3 Encounters:  12/18/14 118/74  09/18/14 118/78  07/18/14 118/64    ANTICOAGULATION for atrial fibrillation: Currently followed in Coumadin clinic regularly She has had long-standing atrial fibrillation without symptoms  Renal insufficiency: Creatinine level has been somewhat variable, better on the last visit with creatinine clearance 49  Lab Results  Component Value Date   CREATININE 1.12 09/12/2014   Foot exam in 9/15 showed Monofilament sensation in toes is normal        Vibration sense at toes is moderately reduced bilaterally.  Reflexes  absent bilaterally at ankles  Vitamin D deficiency: She is regular on her supplement weekly, vitamin D level pending    Examination:   BP 118/74 mmHg  Pulse 81  Temp(Src) 98.2 F (36.8 C)  Resp 16  Ht '5\' 6"'  (1.676 m)  Wt 215 lb 9.6 oz (97.796 kg)  BMI 34.82 kg/m2  SpO2 96%  Body mass index is 34.82 kg/(m^2).   No pedal edema   Heart rate is 70-74, slightly irregular with no murmur or S3  Assesment/Plan:   1. Diabetes type 2, with  obesity   The patient's diabetes is difficult to assess as she has not checked her blood sugar much at all and she still has relatively high fasting readings Although A1c 7.1 she may be mostly having high blood sugars overnight Discussed that if her renal function is consistently  near normal weekend increase Janumet to twice a day Otherwise will increase her Amaryl to at least 3 mg in the evening  Reminded her to check blood sugars more after lunch and supper Encouraged her to be more active with home exercises and work on weight loss and general  2. Long-standing atrial fibrillation: Asymptomatic and her heart rate is controlled with beta blocker  3. Blood pressure  is well-controlled on Ziac  4.  History of osteoporosis.  She is stable on Evista.  Since this provides fracture prevention without altering bone density do not need any follow-up on the bone density     Patient Instructions  Check blood sugars on waking up .. 2 .. times a week Also check blood sugars about 2 hours after a meal and  do this after different meals by rotation  Recommended blood sugar levels on waking up is 90-130 and about 2 hours after meal is 140-180 Please bring blood sugar monitor to each visit.  Daily exercise        Leveda Kendrix 12/18/2014, 8:35 AM

## 2014-12-18 NOTE — Patient Instructions (Signed)
Check blood sugars on waking up .. 2 .. times a week Also check blood sugars about 2 hours after a meal and do this after different meals by rotation  Recommended blood sugar levels on waking up is 90-130 and about 2 hours after meal is 140-180 Please bring blood sugar monitor to each visit.  Daily exercise

## 2014-12-19 ENCOUNTER — Other Ambulatory Visit: Payer: Self-pay | Admitting: *Deleted

## 2014-12-19 MED ORDER — GLIMEPIRIDE 2 MG PO TABS: ORAL_TABLET | ORAL | Status: DC

## 2014-12-25 ENCOUNTER — Other Ambulatory Visit: Payer: Self-pay | Admitting: Endocrinology

## 2015-01-09 ENCOUNTER — Ambulatory Visit (INDEPENDENT_AMBULATORY_CARE_PROVIDER_SITE_OTHER): Payer: Medicare Other | Admitting: General Practice

## 2015-01-09 DIAGNOSIS — Z5181 Encounter for therapeutic drug level monitoring: Secondary | ICD-10-CM

## 2015-01-09 DIAGNOSIS — I4891 Unspecified atrial fibrillation: Secondary | ICD-10-CM

## 2015-01-09 LAB — POCT INR: INR: 2.7

## 2015-01-09 NOTE — Progress Notes (Signed)
I have reviewed and agree with the plan. 

## 2015-01-09 NOTE — Progress Notes (Signed)
Pre visit review using our clinic review tool, if applicable. No additional management support is needed unless otherwise documented below in the visit note. 

## 2015-02-05 ENCOUNTER — Telehealth: Payer: Self-pay | Admitting: Endocrinology

## 2015-02-06 ENCOUNTER — Other Ambulatory Visit: Payer: Self-pay | Admitting: *Deleted

## 2015-02-06 ENCOUNTER — Ambulatory Visit (INDEPENDENT_AMBULATORY_CARE_PROVIDER_SITE_OTHER): Payer: Medicare Other | Admitting: General Practice

## 2015-02-06 DIAGNOSIS — Z5181 Encounter for therapeutic drug level monitoring: Secondary | ICD-10-CM | POA: Diagnosis not present

## 2015-02-06 LAB — POCT INR: INR: 1.7

## 2015-02-06 MED ORDER — BISOPROLOL-HYDROCHLOROTHIAZIDE 10-6.25 MG PO TABS: 1.0000 | ORAL_TABLET | Freq: Every day | ORAL | Status: DC

## 2015-02-06 NOTE — Telephone Encounter (Signed)
Pt needs ZIAC refill to [facility] please

## 2015-02-06 NOTE — Progress Notes (Signed)
Pre visit review using our clinic review tool, if applicable. No additional management support is needed unless otherwise documented below in the visit note. 

## 2015-02-06 NOTE — Progress Notes (Signed)
I have reviewed and agree with the plan. 

## 2015-02-06 NOTE — Telephone Encounter (Signed)
rx sent

## 2015-03-06 ENCOUNTER — Ambulatory Visit (INDEPENDENT_AMBULATORY_CARE_PROVIDER_SITE_OTHER): Payer: Medicare Other | Admitting: General Practice

## 2015-03-06 DIAGNOSIS — I4891 Unspecified atrial fibrillation: Secondary | ICD-10-CM | POA: Diagnosis not present

## 2015-03-06 DIAGNOSIS — Z5181 Encounter for therapeutic drug level monitoring: Secondary | ICD-10-CM | POA: Diagnosis not present

## 2015-03-06 LAB — POCT INR: INR: 1.6

## 2015-03-06 NOTE — Progress Notes (Signed)
I have reviewed and agree with the plan. 

## 2015-03-06 NOTE — Progress Notes (Signed)
Pre visit review using our clinic review tool, if applicable. No additional management support is needed unless otherwise documented below in the visit note. 

## 2015-03-07 ENCOUNTER — Telehealth: Payer: Self-pay | Admitting: Endocrinology

## 2015-03-07 NOTE — Telephone Encounter (Signed)
No answer on call back, not able to leave a message

## 2015-03-07 NOTE — Telephone Encounter (Signed)
Complaining about cost of Janumet She can switch to metformin 1 g twice a day but also need to check blood sugar more consistently and added another half tablet of glimepiride in the morning

## 2015-03-08 NOTE — Telephone Encounter (Signed)
Patient said nevermind, she is fine with taking the Janumet and wants to stay on that.

## 2015-04-03 ENCOUNTER — Ambulatory Visit (INDEPENDENT_AMBULATORY_CARE_PROVIDER_SITE_OTHER): Payer: Medicare Other | Admitting: General Practice

## 2015-04-03 DIAGNOSIS — Z5181 Encounter for therapeutic drug level monitoring: Secondary | ICD-10-CM

## 2015-04-03 DIAGNOSIS — I4891 Unspecified atrial fibrillation: Secondary | ICD-10-CM | POA: Diagnosis not present

## 2015-04-03 LAB — POCT INR: INR: 1.7

## 2015-04-03 NOTE — Progress Notes (Signed)
Pre visit review using our clinic review tool, if applicable. No additional management support is needed unless otherwise documented below in the visit note. 

## 2015-04-03 NOTE — Progress Notes (Signed)
I have reviewed and agree with the plan. 

## 2015-04-08 ENCOUNTER — Other Ambulatory Visit: Payer: Self-pay | Admitting: Endocrinology

## 2015-04-12 ENCOUNTER — Other Ambulatory Visit: Payer: Medicare Other

## 2015-04-16 ENCOUNTER — Other Ambulatory Visit (INDEPENDENT_AMBULATORY_CARE_PROVIDER_SITE_OTHER): Payer: Medicare Other

## 2015-04-16 DIAGNOSIS — E1165 Type 2 diabetes mellitus with hyperglycemia: Secondary | ICD-10-CM

## 2015-04-16 DIAGNOSIS — IMO0002 Reserved for concepts with insufficient information to code with codable children: Secondary | ICD-10-CM

## 2015-04-16 LAB — LIPID PANEL
CHOL/HDL RATIO: 3
Cholesterol: 126 mg/dL (ref 0–200)
HDL: 48.2 mg/dL (ref >39.00)
LDL Cholesterol: 56 mg/dL (ref 0–99)
NonHDL: 77.99
TRIGLYCERIDES: 109 mg/dL (ref 0.0–149.0)
VLDL: 21.8 mg/dL (ref 0.0–40.0)

## 2015-04-16 LAB — BASIC METABOLIC PANEL
BUN: 21 mg/dL (ref 6–23)
CALCIUM: 9.8 mg/dL (ref 8.4–10.5)
CO2: 31 meq/L (ref 19–32)
Chloride: 101 mEq/L (ref 96–112)
Creatinine, Ser: 1.26 mg/dL — ABNORMAL HIGH (ref 0.40–1.20)
GFR: 42.7 mL/min — AB (ref >60.00)
Glucose, Bld: 126 mg/dL — ABNORMAL HIGH (ref 70–99)
POTASSIUM: 4.6 meq/L (ref 3.5–5.1)
SODIUM: 138 meq/L (ref 135–145)

## 2015-04-16 LAB — HEMOGLOBIN A1C: HEMOGLOBIN A1C: 6.9 % — AB (ref 4.6–6.5)

## 2015-04-18 ENCOUNTER — Ambulatory Visit (INDEPENDENT_AMBULATORY_CARE_PROVIDER_SITE_OTHER): Payer: Medicare Other | Admitting: Endocrinology

## 2015-04-18 ENCOUNTER — Encounter: Payer: Self-pay | Admitting: Endocrinology

## 2015-04-18 VITALS — BP 124/82 | HR 74 | Temp 97.9°F | Resp 16 | Ht 66.0 in | Wt 217.0 lb

## 2015-04-18 DIAGNOSIS — Z0000 Encounter for general adult medical examination without abnormal findings: Secondary | ICD-10-CM | POA: Diagnosis not present

## 2015-04-18 DIAGNOSIS — I482 Chronic atrial fibrillation, unspecified: Secondary | ICD-10-CM

## 2015-04-18 DIAGNOSIS — I10 Essential (primary) hypertension: Secondary | ICD-10-CM | POA: Diagnosis not present

## 2015-04-18 DIAGNOSIS — E119 Type 2 diabetes mellitus without complications: Secondary | ICD-10-CM

## 2015-04-18 DIAGNOSIS — Z23 Encounter for immunization: Secondary | ICD-10-CM

## 2015-04-18 DIAGNOSIS — N182 Chronic kidney disease, stage 2 (mild): Secondary | ICD-10-CM

## 2015-04-18 NOTE — Patient Instructions (Signed)
Reduce Gray diabetes pill to 1 only  Check blood sugars on waking up 2-3  times a week Also check blood sugars about 2 hours after a meal and do this after different meals by rotation  Recommended blood sugar levels on waking up is 90-130 and about 2 hours after meal is 130-160  Please bring your blood sugar monitor to each visit, thank you

## 2015-04-18 NOTE — Progress Notes (Signed)
Susan Potter 81 y.o.    Reason for Appointment: Complete physical exam and diabetes followup  History of Present Illness   Diagnosis: Type 2 DIABETES MELITUS, date of diagnosis: 2002   She is being seen for follow up of Type 2 diabetes. This has been usually mild and well-controlled especially for her age  Current oral hypoglycemic regimen:.  Amaryl 2 mg, 1-1/2 tablets at dinner, Janumet 50/1000, 1 tablet in a.m.  Her A1c generally had been around 7% and is 6.9 now Currently taking blood sugars somewhat sporadically and mostly in the mornings after breakfast Sometimes will have only bread and no protein in the morning and has some readings in the 170 range after meals in the morning  Usually compliant with her diet but is not very active  Has been gradually gaining weight  Side effects from medications: None  Glucometer: One Touch.  Blood Glucose readings: Morning 108-177, mostly after breakfast, afternoon 68, 74 Median 125 overall Fasting glucose in the lab = 126  Hypoglycemia frequency: Occasionally and she will feel weak and tired with this   Food preferences: Avoiding sweets, cereal and large portions, getting an egg in am for breakfast sometimes. Physical activity: a little walking at times  Diabetes complications: None  Wt Readings from Last 3 Encounters:  04/18/15 217 lb (98.431 kg)  12/18/14 215 lb 9.6 oz (97.796 kg)  09/18/14 212 lb (96.163 kg)    Lab Results  Component Value Date   HGBA1C 6.9* 04/16/2015   HGBA1C 7.0* 12/18/2014   HGBA1C 7.1 12/18/2014   Lab Results  Component Value Date   MICROALBUR 1.0 12/18/2014   LDLCALC 56 04/16/2015   CREATININE 1.26* 04/16/2015      HYPERTENSION:    Her blood pressure is controlled . She is on 10 mg Ziac only, partly because of her atrial fibrillation  ANTICOAGULATION for atrial fibrillation: Currently followed in Coumadin clinic and last INR was 1.7 She has had long-standing atrial fibrillation without  symptoms  HYPERLIPIDEMIA: Excellent control, she has been taking Lipitor regularly   Lab Results  Component Value Date   CHOL 126 04/16/2015   HDL 48.20 04/16/2015   LDLCALC 56 04/16/2015   LDLDIRECT 84.6 01/08/2014   TRIG 109.0 04/16/2015   CHOLHDL 3 04/16/2015    PREVENTIVE CARE:  Diet: Usually low fat Exercise: Some walking, limited by low back pain No recent falls  Annual hemoccults:           not indicated   Breast self exams:                             yes   Colonoscopy/sigmoidoscopy  not indicated  Mammograms:  2013  Yearly flu vaccine:                      yes   Bone Density:   2011  Calcium supplements:  no Tetanus booster:  ?   Zostavax:  09/2005  Pneumovax: Prevnar:   08/2000  3/16        Medication List       This list is accurate as of: 04/18/15 10:54 AM.  Always use your most recent med list.               atorvastatin 10 MG tablet  Commonly known as:  LIPITOR  take 1 tablet by mouth once daily     bisoprolol-hydrochlorothiazide 10-6.25 MG tablet  Commonly known as:    ZIAC  Take 1 tablet by mouth daily.     glimepiride 2 MG tablet  Commonly known as:  AMARYL  Take 1 1/2 tablets daily at dinnertime.     glucose blood test strip  One touch Ultra 2 test strips only     ONE TOUCH ULTRA SYSTEM KIT w/Device Kit  One touch Ultra 2 Glucometer     OVER THE COUNTER MEDICATION  Place 1 drop into both eyes daily as needed (dry eyes/itching).     raloxifene 60 MG tablet  Commonly known as:  EVISTA  Take 60 mg by mouth daily.     sitaGLIPtin-metformin 50-1000 MG tablet  Commonly known as:  JANUMET  Take 1 tablet by mouth 2 (two) times daily with a meal.     Vitamin D (Ergocalciferol) 50000 units Caps capsule  Commonly known as:  DRISDOL  take 1 capsule by mouth every week ON SUNDAYS     warfarin 5 MG tablet  Commonly known as:  COUMADIN  Take 1 tablet (5 mg total) by mouth daily.        Allergies: No Known Allergies  Past Medical History   Diagnosis Date  . Retinal vein occlusion   . Atrial fibrillation ([place])   . Osteoporosis   . Hypertension   . Diabetes mellitus without complication ([place])     No past surgical history on file.  Family History  Problem Relation Age of Onset  . Heart disease Brother     Social History:  reports that she has never smoked. She does not have any smokeless tobacco history on file. She reports that she does not drink alcohol. Her drug history is not on file.  Review of Systems -   Osteoporosis: her  T score at the femoral neck was -3.1 in 12/2009 compared to 2.5 in 2006. T score of the spine was -2.5 compared to previous one of 3.3. She had been on bisphosphonates including 2 doses of Reclast infusion. May have had transient increase in creatinine with last Reclast and this was stopped.  Was started on Evista in 12/12 and she is tolerating this well. She did have left arm fracture in 9/14 but this was from a significant fall   Last vitamin D level is normal at 40, compliant with her weekly dose  Renal insufficiency: Present in the past, stable recently in the upper normal range  Lab Results  Component Value Date   CREATININE 1.26* 04/16/2015       No unusual headaches.     ENT:  No difficulties with nasal congestion.  Normal hearing.      Skin: No rash or infections     Thyroid:  No cold or heat intolerance, unusual fatigue. TSH normal   Lab Results  Component Value Date   TSH 2.91 07/05/2013   TSH 3.84 02/08/2013   TSH 1.60 11/08/2012       Her blood pressure has been high and has been on Ziac since 2002.  Lisinopril stopped because of relatively high creatinine   Usually well controlled.     She has been told to have an irregular heart since 1980s but had never been treated till seen in 2002.  Coumadin started in 2002 Does not complain of any palpitations     No swelling of feet.     No shortness of breath on exertion.     Bowel habits: Normal, no change.  No heartburn or abdominal pain       Urinary symptoms: Nocturia once usually; no dysuria. Having minimal urge incontinence more recently  No numbness attending in her feet or hands      No joint pains. Some back pain/fatigability present on standing or walking for long, does not take any analgesics    Examination:   BP 124/82 mmHg  Pulse 74  Temp(Src) 97.9 F (36.6 C)  Resp 16  Ht 5' 6" (1.676 m)  Wt 217 lb (98.431 kg)  BMI 35.04 kg/m2  SpO2 95%  Body mass index is 35.04 kg/(m^2).    GENERAL: Mild generalized obesity present  No pallor, clubbing, lymphadenopathy or edema. .  Skin:  no rash or pigmentation.  EYES:  Externally normal.  Fundii: No retinopathy seen, optic disks are normal  ENT:  .  Mouth & throat normal.  THYROID:  Not palpable.  CAROTIDS:  Normal character; no bruit.  HEART:  Normal apex, S1 and S2; no murmur or click. Heart rate 74, irregular  CHEST:  Normal shape.  Lungs:   Vescicular breath sounds heard equally.  No crepitations/ wheeze.  BREASTS:  Skin and nipples normal.  No mass palpable.  ABDOMEN:  No distention.  Liver and spleen not palpable.  No other mass or tenderness. No abdominal bruit, no abnormal epigastric pulsation felt    RECTAL exam:  Not indicated.                     Pelvic exam:  Not indicated.  NEUROLOGICAL:. Monofilament sensation in toes is normal        Vibration sense at toes is moderately reduced bilaterally.  Reflexes are absent bilaterally at anklesan normal at biceps  SPINE AND JOINTS:  Normal. No tenderness or deformity of the spine  Skin: normal,some excoriation marks on right lower shin  Assesment/Plan:   1. Diabetes type 2, with  obesity   The patient's diabetes is well controlled and she is compliant with her Janumet and Amaryl  She now is having occasional low normal blood sugars in the afternoons and rarely may have symptoms, lowest sugar 68 Has mild increase in postprandial readings after breakfast  when she does not get protein A1c is excellent at 6.9  She does have difficulty with weight gain recently and encouraged her to start walking for exercise  2. Long-standing atrial fibrillation: Rate is controlled with beta blocker.  EKG to be checked today  3. Blood pressure is controlled on Ziac 10 mg.  4. Chronic asymptomatic pyuria. Urinalysis to be rechecked  5. History of osteoporosis: to continue Evista. Has not had any history of low impact fractures or height loss.  Since she is on Evista does not need bone density  6. Hyperlipidemia: Well controlled with Lipitor  7.  Renal insufficiency: Creatinine clearance is 42 but stable.  Likely to be from nephrosclerosis  PREVENTIVE care:  Encouraged her to walk as much as tolerated Depression screening done Discussed preventive vaccinations, she is due for her influenza vaccine today and will have her get Pneumovax booster on the next visit Other vaccines are up to date  Because of her age colorectal screening not indicated  Lipid control and low-fat diet discussed She will continue on vitamin D, last level normal She will continue Evista for fracture prevention Prophylactic aspirin is not needed as she is on Coumadin  Discussed living will and we will provide her with a copy of this to complete  [facility] 04/18/2015,  10:54 AM   Addendum: Labs as follows   No visits with results within 1 Day(s) from this visit. Latest known visit with results is:  Lab on 04/16/2015  Component Date Value Ref Range Status  . Hgb A1c MFr Bld 04/16/2015 6.9* 4.6 - 6.5 % Final   Glycemic Control Guidelines for People with Diabetes:Non Diabetic:  <6%Goal of Therapy: <7%Additional Action Suggested:  >8%   . Sodium 04/16/2015 138  135 - 145 mEq/L Final  . Potassium 04/16/2015 4.6  3.5 - 5.1 mEq/L Final  . Chloride 04/16/2015 101  96 - 112 mEq/L Final  . CO2 04/16/2015 31  19 - 32 mEq/L Final  . Glucose, Bld 04/16/2015 126* 70 - 99 mg/dL  Final  . BUN 04/16/2015 21  6 - 23 mg/dL Final  . Creatinine, Ser 04/16/2015 1.26* 0.40 - 1.20 mg/dL Final  . Calcium 04/16/2015 9.8  8.4 - 10.5 mg/dL Final  . GFR 04/16/2015 42.70* >60.00 mL/min Final  . Cholesterol 04/16/2015 126  0 - 200 mg/dL Final   ATP III Classification       Desirable:  < 200 mg/dL               Borderline High:  200 - 239 mg/dL          High:  > = 240 mg/dL  . Triglycerides 04/16/2015 109.0  0.0 - 149.0 mg/dL Final   Normal:  <150 mg/dLBorderline High:  150 - 199 mg/dL  . HDL 04/16/2015 48.20  >39.00 mg/dL Final  . VLDL 04/16/2015 21.8  0.0 - 40.0 mg/dL Final  . LDL Cholesterol 04/16/2015 56  0 - 99 mg/dL Final  . Total CHOL/HDL Ratio 04/16/2015 3   Final                  Men          Women1/2 Average Risk     3.4          3.3Average Risk          5.0          4.42X Average Risk          9.6          7.13X Average Risk          15.0          11.0                      . NonHDL 04/16/2015 77.99   Final   NOTE:  Non-HDL goal should be 30 mg/dL higher than patient's LDL goal (i.e. LDL goal of < 70 mg/dL, would have non-HDL goal of < 100 mg/dL)    

## 2015-05-01 ENCOUNTER — Ambulatory Visit: Payer: Medicare Other

## 2015-05-08 ENCOUNTER — Ambulatory Visit (INDEPENDENT_AMBULATORY_CARE_PROVIDER_SITE_OTHER): Payer: Medicare Other | Admitting: General Practice

## 2015-05-08 DIAGNOSIS — I4891 Unspecified atrial fibrillation: Secondary | ICD-10-CM

## 2015-05-08 DIAGNOSIS — Z5181 Encounter for therapeutic drug level monitoring: Secondary | ICD-10-CM

## 2015-05-08 LAB — POCT INR: INR: 1.6

## 2015-05-08 NOTE — Progress Notes (Signed)
I have reviewed and agree with the plan. 

## 2015-05-08 NOTE — Progress Notes (Signed)
Pre visit review using our clinic review tool, if applicable. No additional management support is needed unless otherwise documented below in the visit note. INR is low today, 1.6.  Patient denies missing doses, says that diet has remained the same and that meds have not changed.  Unsure as to why the INR is low but dosage was boosted today and tomorrow and over all dosage was increased by 10 percent.  Due to dosage change, I will re-check patient in 2 weeks.  Patient was instructed to watch for any unusual bleeding or bruising over the next couple of weeks.  Patient verbalized understanding.

## 2015-05-22 ENCOUNTER — Ambulatory Visit (INDEPENDENT_AMBULATORY_CARE_PROVIDER_SITE_OTHER): Payer: Medicare Other | Admitting: General Practice

## 2015-05-22 DIAGNOSIS — Z5181 Encounter for therapeutic drug level monitoring: Secondary | ICD-10-CM

## 2015-05-22 DIAGNOSIS — I4891 Unspecified atrial fibrillation: Secondary | ICD-10-CM | POA: Diagnosis not present

## 2015-05-22 LAB — POCT INR: INR: 1.6

## 2015-05-22 NOTE — Progress Notes (Signed)
I have reviewed and agree with the plan. 

## 2015-05-22 NOTE — Progress Notes (Signed)
Pre visit review using our clinic review tool, if applicable. No additional management support is needed unless otherwise documented below in the visit note.  INR is low today.  Dosage was increased at last visit but there is no change.  Boosted patient for 2 days and increased dosage again.  Re-check in 2 weeks.  Reiterated how important it is not to miss dosages and the risks involved with a low INR.  Also discussed with patient the importance of notifying the clinic if any changes in medications and diet.  Encouraged patient to use a pill box for medications.  Patient verbalized understanding.

## 2015-05-24 ENCOUNTER — Other Ambulatory Visit: Payer: Self-pay | Admitting: Endocrinology

## 2015-06-07 ENCOUNTER — Ambulatory Visit (INDEPENDENT_AMBULATORY_CARE_PROVIDER_SITE_OTHER): Payer: Medicare Other | Admitting: General Practice

## 2015-06-07 DIAGNOSIS — Z5181 Encounter for therapeutic drug level monitoring: Secondary | ICD-10-CM

## 2015-06-07 DIAGNOSIS — I4891 Unspecified atrial fibrillation: Secondary | ICD-10-CM | POA: Diagnosis not present

## 2015-06-07 LAB — POCT INR: INR: 4.5

## 2015-06-07 NOTE — Progress Notes (Signed)
Pre visit review using our clinic review tool, if applicable. No additional management support is needed unless otherwise documented below in the visit note.  INR is high today.  Patient misunderstood dosing instructions at last visit and took extra medication this week.  Encouraged patient to keep dosing instructions somewhere she can refer to easily and to use a pill box to help manage medication.  Encouraged patient to watch and report unusual bleeding or bruising.

## 2015-06-07 NOTE — Progress Notes (Signed)
I have reviewed and agree with the plan. 

## 2015-06-21 ENCOUNTER — Ambulatory Visit: Payer: Medicare Other

## 2015-06-28 ENCOUNTER — Ambulatory Visit (INDEPENDENT_AMBULATORY_CARE_PROVIDER_SITE_OTHER): Payer: Medicare Other | Admitting: General Practice

## 2015-06-28 DIAGNOSIS — Z5181 Encounter for therapeutic drug level monitoring: Secondary | ICD-10-CM

## 2015-06-28 DIAGNOSIS — I4891 Unspecified atrial fibrillation: Secondary | ICD-10-CM

## 2015-06-28 LAB — POCT INR: INR: 3

## 2015-06-28 NOTE — Progress Notes (Signed)
Pre visit review using our clinic review tool, if applicable. No additional management support is needed unless otherwise documented below in the visit note. 

## 2015-06-28 NOTE — Progress Notes (Signed)
I have reviewed and agree with the plan. 

## 2015-07-08 ENCOUNTER — Other Ambulatory Visit: Payer: Self-pay | Admitting: *Deleted

## 2015-07-08 MED ORDER — SITAGLIPTIN PHOS-METFORMIN HCL 50-1000 MG PO TABS: 1.0000 | ORAL_TABLET | Freq: Two times a day (BID) | ORAL | Status: DC

## 2015-07-15 ENCOUNTER — Other Ambulatory Visit: Payer: Self-pay | Admitting: Endocrinology

## 2015-07-26 ENCOUNTER — Ambulatory Visit (INDEPENDENT_AMBULATORY_CARE_PROVIDER_SITE_OTHER): Payer: Medicare Other | Admitting: General Practice

## 2015-07-26 DIAGNOSIS — Z5181 Encounter for therapeutic drug level monitoring: Secondary | ICD-10-CM

## 2015-07-26 DIAGNOSIS — I4891 Unspecified atrial fibrillation: Secondary | ICD-10-CM | POA: Diagnosis not present

## 2015-07-26 LAB — POCT INR: INR: 2.8

## 2015-07-26 NOTE — Progress Notes (Signed)
I have reviewed and agree with the plan. 

## 2015-07-26 NOTE — Progress Notes (Signed)
Pre visit review using our clinic review tool, if applicable. No additional management support is needed unless otherwise documented below in the visit note. 

## 2015-08-13 ENCOUNTER — Other Ambulatory Visit: Payer: Medicare Other

## 2015-08-13 ENCOUNTER — Other Ambulatory Visit: Payer: Self-pay | Admitting: *Deleted

## 2015-08-13 DIAGNOSIS — Z794 Long term (current) use of insulin: Principal | ICD-10-CM

## 2015-08-13 DIAGNOSIS — IMO0001 Reserved for inherently not codable concepts without codable children: Secondary | ICD-10-CM

## 2015-08-13 DIAGNOSIS — E1165 Type 2 diabetes mellitus with hyperglycemia: Principal | ICD-10-CM

## 2015-08-16 ENCOUNTER — Ambulatory Visit (INDEPENDENT_AMBULATORY_CARE_PROVIDER_SITE_OTHER): Payer: Medicare Other | Admitting: Endocrinology

## 2015-08-16 ENCOUNTER — Encounter: Payer: Self-pay | Admitting: Endocrinology

## 2015-08-16 VITALS — BP 132/70 | HR 79 | Temp 98.2°F | Resp 14 | Ht 66.0 in | Wt 221.4 lb

## 2015-08-16 DIAGNOSIS — E1165 Type 2 diabetes mellitus with hyperglycemia: Secondary | ICD-10-CM

## 2015-08-16 DIAGNOSIS — E1169 Type 2 diabetes mellitus with other specified complication: Secondary | ICD-10-CM

## 2015-08-16 DIAGNOSIS — M810 Age-related osteoporosis without current pathological fracture: Secondary | ICD-10-CM

## 2015-08-16 DIAGNOSIS — IMO0002 Reserved for concepts with insufficient information to code with codable children: Secondary | ICD-10-CM

## 2015-08-16 DIAGNOSIS — Z794 Long term (current) use of insulin: Secondary | ICD-10-CM | POA: Diagnosis not present

## 2015-08-16 DIAGNOSIS — IMO0001 Reserved for inherently not codable concepts without codable children: Secondary | ICD-10-CM

## 2015-08-16 LAB — COMPREHENSIVE METABOLIC PANEL
ALBUMIN: 4.1 g/dL (ref 3.5–5.2)
ALT: 7 U/L (ref 0–35)
AST: 13 U/L (ref 0–37)
Alkaline Phosphatase: 41 U/L (ref 39–117)
BUN: 13 mg/dL (ref 6–23)
CALCIUM: 10 mg/dL (ref 8.4–10.5)
CHLORIDE: 102 meq/L (ref 96–112)
CO2: 29 meq/L (ref 19–32)
CREATININE: 1.2 mg/dL (ref 0.40–1.20)
GFR: 45.14 mL/min — AB (ref >60.00)
Glucose, Bld: 145 mg/dL — ABNORMAL HIGH (ref 70–99)
POTASSIUM: 4 meq/L (ref 3.5–5.1)
Sodium: 139 mEq/L (ref 135–145)
Total Bilirubin: 1.1 mg/dL (ref 0.2–1.2)
Total Protein: 7.5 g/dL (ref 6.0–8.3)

## 2015-08-16 LAB — CBC WITH DIFFERENTIAL/PLATELET
BASOS ABS: 0.1 10*3/uL (ref 0.0–0.1)
BASOS PCT: 0.6 % (ref 0.0–3.0)
EOS ABS: 0.2 10*3/uL (ref 0.0–0.7)
Eosinophils Relative: 2.2 % (ref 0.0–5.0)
HEMATOCRIT: 38.9 % (ref 36.0–46.0)
Hemoglobin: 12.8 g/dL (ref 12.0–15.0)
LYMPHS ABS: 1 10*3/uL (ref 0.7–4.0)
LYMPHS PCT: 12.7 % (ref 12.0–46.0)
MCHC: 33 g/dL (ref 30.0–36.0)
MCV: 86.3 fl (ref 78.0–100.0)
Monocytes Absolute: 0.5 10*3/uL (ref 0.1–1.0)
Monocytes Relative: 6.7 % (ref 3.0–12.0)
NEUTROS ABS: 6.3 10*3/uL (ref 1.4–7.7)
NEUTROS PCT: 77.8 % — AB (ref 43.0–77.0)
PLATELETS: 233 10*3/uL (ref 150.0–400.0)
RBC: 4.51 Mil/uL (ref 3.87–5.11)
RDW: 14.5 % (ref 11.5–15.5)
WBC: 8.1 10*3/uL (ref 4.0–10.5)

## 2015-08-16 LAB — POCT GLYCOSYLATED HEMOGLOBIN (HGB A1C): Hemoglobin A1C: 6.9

## 2015-08-16 LAB — LIPID PANEL
CHOL/HDL RATIO: 3
CHOLESTEROL: 124 mg/dL (ref 0–200)
HDL: 45.1 mg/dL (ref >39.00)
LDL CALC: 60 mg/dL (ref 0–99)
NonHDL: 78.52
TRIGLYCERIDES: 91 mg/dL (ref 0.0–149.0)
VLDL: 18.2 mg/dL (ref 0.0–40.0)

## 2015-08-16 NOTE — Progress Notes (Signed)
Susan Potter 82 y.o.    Reason for Appointment:  diabetes followup  History of Present Illness   Diagnosis: Type 2 DIABETES MELITUS, date of diagnosis: 2002   She is being seen for follow up of Type 2 diabetes. This has been usually mild and well-controlled especially for her age  Current oral hypoglycemic regimen:.  Amaryl 2 mg, 1 tablets at dinner, Janumet 50/1000, 1 tablet in a.m.  Her A1c generally had been around 7% and is 6.9  Currently taking blood sugars somewhat sporadically and mostly in the mornings Usually compliant with her diet but she thinks she is getting more fruit She is not very active because of back pain Has been gradually gaining weight  Side effects from medications: None  Glucometer: One Touch.  Blood Glucose readings: Morning 119-207, evening 128, 147 Median  143  Hypoglycemia frequency: None recently but she will feel weak and tired with this   Food preferences: Avoiding sweets, cereal and large portions, getting an egg in am for breakfast sometimes, eating more fruit. Physical activity: a little walking at times  Diabetes complications: None  Wt Readings from Last 3 Encounters:  08/16/15 221 lb 6.4 oz (100.426 kg)  04/18/15 217 lb (98.431 kg)  12/18/14 215 lb 9.6 oz (97.796 kg)    Lab Results  Component Value Date   HGBA1C 6.9 08/16/2015   HGBA1C 6.9* 04/16/2015   HGBA1C 7.0* 12/18/2014   Lab Results  Component Value Date   MICROALBUR 1.0 12/18/2014   LDLCALC 56 04/16/2015   CREATININE 1.26* 04/16/2015      HYPERTENSION:    Her blood pressure is controlled . She is on 10 mg Ziac only, partly because of her atrial fibrillation  ANTICOAGULATION for atrial fibrillation: Currently followed in Coumadin clinic and last INR was 1.7 She has had long-standing atrial fibrillation without symptoms  HYPERLIPIDEMIA: Excellent control, she has been taking Lipitor regularly   Lab Results  Component Value Date   CHOL 126 04/16/2015   HDL  48.20 04/16/2015   LDLCALC 56 04/16/2015   LDLDIRECT 84.6 01/08/2014   TRIG 109.0 04/16/2015   CHOLHDL 3 04/16/2015         Medication List       This list is accurate as of: 08/16/15  8:26 AM.  Always use your most recent med list.               atorvastatin 10 MG tablet  Commonly known as:  LIPITOR  take 1 tablet by mouth once daily     bisoprolol-hydrochlorothiazide 10-6.25 MG tablet  Commonly known as:  ZIAC  Take 1 tablet by mouth daily.     COUMADIN 5 MG tablet  Generic drug:  warfarin  take 1 tablet by mouth once daily     glimepiride 2 MG tablet  Commonly known as:  AMARYL  Take 1 1/2 tablets daily at dinnertime.     glucose blood test strip  One touch Ultra 2 test strips only     ONE TOUCH ULTRA SYSTEM KIT w/Device Kit  One touch Ultra 2 Glucometer     OVER THE COUNTER MEDICATION  Place 1 drop into both eyes daily as needed (dry eyes/itching).     raloxifene 60 MG tablet  Commonly known as:  EVISTA  Take 60 mg by mouth daily.     sitaGLIPtin-metformin 50-1000 MG tablet  Commonly known as:  JANUMET  Take 1 tablet by mouth 2 (two) times daily with a meal.  Vitamin D (Ergocalciferol) 50000 units Caps capsule  Commonly known as:  DRISDOL  take 1 capsule by mouth every week ON SUNDAYS        Allergies: No Known Allergies  Past Medical History  Diagnosis Date  . Retinal vein occlusion   . Atrial fibrillation ([place])   . Osteoporosis   . Hypertension   . Diabetes mellitus without complication ([place])     No past surgical history on file.  Family History  Problem Relation Age of Onset  . Heart disease Brother     Social History:  reports that she has never smoked. She does not have any smokeless tobacco history on file. She reports that she does not drink alcohol. Her drug history is not on file.  Review of Systems -   Osteoporosis: Taking Evista  Last vitamin D level is normal at 40, compliant with her weekly dose  Renal  insufficiency: Present in the past, needs follow-up  Lab Results  Component Value Date   CREATININE 1.26* 04/16/2015         Some back pain/fatigability present on standing or walking for long, does not take any analgesics    Examination:   BP 132/70 mmHg  Pulse 79  Temp(Src) 98.2 F (36.8 C)  Resp 14  Ht _0  (1.676 m)  Wt 221 lb 6.4 oz (100.426 kg)  BMI 35.75 kg/m2  SpO2 98%  Body mass index is 35.75 kg/(m^2).    Exam not indicated  Assesment/Plan:   1. Diabetes type 2, with  obesity   The patient's diabetes is adequately controlled and she is compliant with her Janumet and Amaryl: A1c again 6.9  Has occasional high readings at home although is checking readings infrequently and mostly in the morning  She does have difficulty with weight gain again She will try to do some water exercises as suggested today and continue to improve diet  2. Long-standing atrial fibrillation: Rate is controlled with beta blocker.    3.  HYPERTENSION: Blood pressure is controlled on Ziac 10 mg.  4. Chronic mild renal dysfunction: Will check labs and also CBC today    Rook Maue 08/16/2015, 8:26 AM

## 2015-08-16 NOTE — Patient Instructions (Signed)
Check blood sugars on waking up  2-3 times a week Also check blood sugars about 2 hours after a meal and do this after different meals by rotation  Recommended blood sugar levels on waking up is 90-130 and about 2 hours after meal is 130-160  Please bring your blood sugar monitor to each visit, thank you  Water exercises  Bring copy of living will

## 2015-08-23 ENCOUNTER — Ambulatory Visit (INDEPENDENT_AMBULATORY_CARE_PROVIDER_SITE_OTHER): Payer: Medicare Other | Admitting: General Practice

## 2015-08-23 ENCOUNTER — Ambulatory Visit: Payer: Medicare Other

## 2015-08-23 DIAGNOSIS — I4891 Unspecified atrial fibrillation: Secondary | ICD-10-CM | POA: Diagnosis not present

## 2015-08-23 DIAGNOSIS — Z5181 Encounter for therapeutic drug level monitoring: Secondary | ICD-10-CM | POA: Diagnosis not present

## 2015-08-23 LAB — POCT INR: INR: 2.1

## 2015-08-23 NOTE — Progress Notes (Signed)
Pre visit review using our clinic review tool, if applicable. No additional management support is needed unless otherwise documented below in the visit note. 

## 2015-08-26 ENCOUNTER — Telehealth: Payer: Self-pay | Admitting: Endocrinology

## 2015-08-26 MED ORDER — BISOPROLOL-HYDROCHLOROTHIAZIDE 10-6.25 MG PO TABS: 1.0000 | ORAL_TABLET | Freq: Every day | ORAL | Status: DC

## 2015-08-26 NOTE — Telephone Encounter (Signed)
Rx submitted per pt's request.  

## 2015-08-26 NOTE — Telephone Encounter (Signed)
[facility]iac call into [facility]

## 2015-09-09 ENCOUNTER — Other Ambulatory Visit: Payer: Self-pay | Admitting: Endocrinology

## 2015-09-20 ENCOUNTER — Ambulatory Visit (INDEPENDENT_AMBULATORY_CARE_PROVIDER_SITE_OTHER): Payer: Medicare Other | Admitting: General Practice

## 2015-09-20 DIAGNOSIS — I4891 Unspecified atrial fibrillation: Secondary | ICD-10-CM | POA: Diagnosis not present

## 2015-09-20 DIAGNOSIS — Z5181 Encounter for therapeutic drug level monitoring: Secondary | ICD-10-CM

## 2015-09-20 LAB — POCT INR: INR: 2.4

## 2015-09-20 NOTE — Progress Notes (Signed)
I have reviewed and agree with the plan. 

## 2015-09-20 NOTE — Progress Notes (Signed)
Pre visit review using our clinic review tool, if applicable. No additional management support is needed unless otherwise documented below in the visit note. 

## 2015-10-23 ENCOUNTER — Telehealth: Payer: Self-pay

## 2015-10-23 NOTE — Telephone Encounter (Signed)
Patient is on the list for [facility] 2017 and may be a good candidate for an AWV in 2017. Please let me know if/when appt is scheduled.   Note: Patient has an upcoming appt but it is not for an annual.

## 2015-10-23 NOTE — Telephone Encounter (Signed)
Pt has AWV now scheduled on 8/28

## 2015-11-01 ENCOUNTER — Ambulatory Visit (INDEPENDENT_AMBULATORY_CARE_PROVIDER_SITE_OTHER): Payer: Medicare Other | Admitting: General Practice

## 2015-11-01 DIAGNOSIS — I4891 Unspecified atrial fibrillation: Secondary | ICD-10-CM

## 2015-11-01 DIAGNOSIS — Z5181 Encounter for therapeutic drug level monitoring: Secondary | ICD-10-CM

## 2015-11-01 LAB — POCT INR: INR: 3.8

## 2015-11-01 NOTE — Progress Notes (Signed)
Pre visit review using our clinic review tool, if applicable. No additional management support is needed unless otherwise documented below in the visit note. 

## 2015-11-01 NOTE — Progress Notes (Signed)
I have reviewed and agree with the plan. 

## 2015-11-26 LAB — HM DIABETES EYE EXAM

## 2015-11-28 ENCOUNTER — Telehealth: Payer: Self-pay | Admitting: Endocrinology

## 2015-11-28 MED ORDER — ATORVASTATIN CALCIUM 10 MG PO TABS: 10.0000 mg | ORAL_TABLET | Freq: Every day | ORAL | 3 refills | Status: DC

## 2015-11-28 NOTE — Telephone Encounter (Signed)
[facility] [address]

## 2015-11-28 NOTE — Telephone Encounter (Signed)
Rx submitted per pt's request.  

## 2015-11-29 ENCOUNTER — Ambulatory Visit (INDEPENDENT_AMBULATORY_CARE_PROVIDER_SITE_OTHER): Payer: Self-pay | Admitting: General Practice

## 2015-11-29 DIAGNOSIS — I4891 Unspecified atrial fibrillation: Secondary | ICD-10-CM

## 2015-11-29 DIAGNOSIS — Z5181 Encounter for therapeutic drug level monitoring: Secondary | ICD-10-CM

## 2015-11-29 LAB — POCT INR: INR: 2.5

## 2015-11-29 NOTE — Telephone Encounter (Signed)
Rx submitted

## 2015-11-29 NOTE — Progress Notes (Signed)
I have reviewed and agree with the plan. 

## 2015-12-11 ENCOUNTER — Other Ambulatory Visit (INDEPENDENT_AMBULATORY_CARE_PROVIDER_SITE_OTHER): Payer: Medicare Other

## 2015-12-11 DIAGNOSIS — E1165 Type 2 diabetes mellitus with hyperglycemia: Secondary | ICD-10-CM

## 2015-12-11 DIAGNOSIS — IMO0002 Reserved for concepts with insufficient information to code with codable children: Secondary | ICD-10-CM

## 2015-12-11 DIAGNOSIS — E1169 Type 2 diabetes mellitus with other specified complication: Secondary | ICD-10-CM | POA: Diagnosis not present

## 2015-12-11 DIAGNOSIS — M810 Age-related osteoporosis without current pathological fracture: Secondary | ICD-10-CM

## 2015-12-11 LAB — MICROALBUMIN / CREATININE URINE RATIO
Creatinine,U: 96.2 mg/dL
MICROALB UR: 4.5 mg/dL — AB (ref 0.0–1.9)
MICROALB/CREAT RATIO: 4.7 mg/g (ref 0.0–30.0)

## 2015-12-11 LAB — BASIC METABOLIC PANEL
BUN: 15 mg/dL (ref 6–23)
CO2: 27 mEq/L (ref 19–32)
CREATININE: 1.13 mg/dL (ref 0.40–1.20)
Calcium: 9.3 mg/dL (ref 8.4–10.5)
Chloride: 103 mEq/L (ref 96–112)
GFR: 48.34 mL/min — AB (ref >60.00)
Glucose, Bld: 163 mg/dL — ABNORMAL HIGH (ref 70–99)
POTASSIUM: 4.1 meq/L (ref 3.5–5.1)
Sodium: 138 mEq/L (ref 135–145)

## 2015-12-11 LAB — VITAMIN D 25 HYDROXY (VIT D DEFICIENCY, FRACTURES): VITD: 27.69 ng/mL — ABNORMAL LOW (ref 30.00–100.00)

## 2015-12-11 LAB — HEMOGLOBIN A1C: HEMOGLOBIN A1C: 6.9 % — AB (ref 4.6–6.5)

## 2015-12-16 ENCOUNTER — Other Ambulatory Visit: Payer: Self-pay

## 2015-12-16 ENCOUNTER — Ambulatory Visit (INDEPENDENT_AMBULATORY_CARE_PROVIDER_SITE_OTHER): Payer: Medicare Other | Admitting: Endocrinology

## 2015-12-16 ENCOUNTER — Encounter: Payer: Self-pay | Admitting: Endocrinology

## 2015-12-16 VITALS — BP 130/80 | HR 80 | Ht 66.0 in | Wt 220.0 lb

## 2015-12-16 DIAGNOSIS — E1165 Type 2 diabetes mellitus with hyperglycemia: Secondary | ICD-10-CM | POA: Diagnosis not present

## 2015-12-16 DIAGNOSIS — E559 Vitamin D deficiency, unspecified: Secondary | ICD-10-CM | POA: Diagnosis not present

## 2015-12-16 DIAGNOSIS — I10 Essential (primary) hypertension: Secondary | ICD-10-CM

## 2015-12-16 DIAGNOSIS — I4891 Unspecified atrial fibrillation: Secondary | ICD-10-CM

## 2015-12-16 MED ORDER — VITAMIN D (ERGOCALCIFEROL) 1.25 MG (50000 UNIT) PO CAPS: ORAL_CAPSULE | ORAL | 5 refills | Status: DC

## 2015-12-16 MED ORDER — GLUCOSE BLOOD VI STRP: ORAL_STRIP | 5 refills | Status: AC

## 2015-12-16 NOTE — Progress Notes (Signed)
Susan Potter 82 y.o.            Reason for Appointment:  diabetes followup  History of Present Illness   Diagnosis: Type 2 DIABETES MELITUS, date of diagnosis: 2002   She is being seen for follow up of Type 2 diabetes. This has been usually mild and well-controlled especially for her age  Current oral hypoglycemic regimen:.  Amaryl 2 mg, 1 tablets at dinner, Janumet 50/1000, 1 tablet in a.m.  Her A1c generally had been around 7% and is 6.9 again  Currently taking blood sugars somewhat sporadically  Her fasting blood sugars appear to be relatively high Usually compliant with her diet but she has difficulty losing weight  She is not very active because of back pain She was supposed to take 3 mg Amaryl at suppertime but is only taking 2 mg  Side effects from medications: None  Glucometer: One Touch.  Blood Glucose readings: Morning 153, 173 Afternoon 107 Evening 169, 175 Median  169  Hypoglycemia frequency: None recently    Food preferences: Avoiding sweets, cereal and large portions, getting an egg in am for breakfast sometimes, eating more fruit. Physical activity: a little walking at times  Diabetes complications: None  Wt Readings from Last 3 Encounters:  12/16/15 220 lb (99.8 kg)  08/16/15 221 lb 6.4 oz (100.4 kg)  04/18/15 217 lb (98.4 kg)    Lab Results  Component Value Date   HGBA1C 6.9 (H) 12/11/2015   HGBA1C 6.9 08/16/2015   HGBA1C 6.9 (H) 04/16/2015   Lab Results  Component Value Date   MICROALBUR 4.5 (H) 12/11/2015   LDLCALC 60 08/16/2015   CREATININE 1.13 12/11/2015      HYPERTENSION:    Her blood pressure is controlled . She is on 10 mg Ziac With good control  ANTICOAGULATION for atrial fibrillation: Currently followed in Coumadin clinic and last INR was 2.5  She has had long-standing atrial fibrillation without symptoms  HYPERLIPIDEMIA: Excellent control, she has been taking Lipitor regularly   Lab Results  Component Value Date   CHOL  124 08/16/2015   HDL 45.10 08/16/2015   LDLCALC 60 08/16/2015   LDLDIRECT 84.6 01/08/2014   TRIG 91.0 08/16/2015   CHOLHDL 3 08/16/2015         Medication List       Accurate as of 12/16/15  8:10 AM. Always use your most recent med list.          atorvastatin 10 MG tablet Commonly known as:  LIPITOR Take 1 tablet (10 mg total) by mouth daily.   bisoprolol-hydrochlorothiazide 10-6.25 MG tablet Commonly known as:  ZIAC Take 1 tablet by mouth daily.   COUMADIN 5 MG tablet Generic drug:  warfarin take 1 tablet by mouth once daily   glimepiride 2 MG tablet Commonly known as:  AMARYL take 1.5 tablets by mouth once daily AT DINNER TIME   glucose blood test strip One touch Ultra 2 test strips only   ONE TOUCH ULTRA SYSTEM KIT w/Device Kit One touch Ultra 2 Glucometer   OVER THE COUNTER MEDICATION Place 1 drop into both eyes daily as needed (dry eyes/itching).   raloxifene 60 MG tablet Commonly known as:  EVISTA Take 60 mg by mouth daily.   sitaGLIPtin-metformin 50-1000 MG tablet Commonly known as:  JANUMET Take 1 tablet by mouth 2 (two) times daily with a meal.   Vitamin D (Ergocalciferol) 50000 units Caps capsule Commonly known as:  DRISDOL take 1 capsule by mouth every week  ON SUNDAYS       Allergies: No Known Allergies  Past Medical History:  Diagnosis Date  . Atrial fibrillation ([place])   . Diabetes mellitus without complication ([place])   . Hypertension   . Osteoporosis   . Retinal vein occlusion     No past surgical history on file.  Family History  Problem Relation Age of Onset  . Heart disease Brother     Social History:  reports that she has never smoked. She does not have any smokeless tobacco history on file. She reports that she does not drink alcohol. Her drug history is not on file.  Review of Systems -   Osteoporosis: Taking Evista  Last vitamin D level is Low now because of her running out of medication for at least a month  Renal  insufficiency: Present in the past, needs periodic follow-up  Lab Results  Component Value Date   CREATININE 1.13 12/11/2015         Some back pain/fatigability present on standing or walking for long, does not take any analgesics    Examination:   BP 130/80   Pulse 80   Ht '5\' 6"'  (1.676 m)   Wt 220 lb (99.8 kg)   SpO2 94%   BMI 35.51 kg/m    Body mass index is 35.51 kg/m.   Heartrate around 75, irregular Note spinal deformity or tenderness No pedal edema  Assesment/Plan:   1. Diabetes type 2, with  obesity   The patient's diabetes is reasonably controlled and she is compliant with her Janumet and Amaryl: A1c again 6.9  However not taking the Amaryl as directed and is taking only 2 mg to 3 mg  Has occasional high readings at home and appears to be having relatively high fasting readings and also relatively high readings after evening meal She will go up to 1-1/2 tablets of the Amaryl  She does have difficulty with weight and is not able to do much exercise   2. Long-standing atrial fibrillation: Rate is controlled with beta blocker.    3.  HYPERTENSION: Blood pressure is controlled on Ziac 10 mg.  4. Chronic mild renal dysfunction: Slightly better  5.  Vitamin D deficiency: She has run out of her prescription and did not call, this will refill today as her level is still low    Susan Potter 12/16/2015, 8:10 AM

## 2015-12-16 NOTE — Addendum Note (Signed)
Addended by: Reather LittlerKUMAR, Izzak Fries on: 12/16/2015 08:20 AM   Modules accepted: Orders

## 2015-12-16 NOTE — Patient Instructions (Addendum)
Check blood sugars on waking up  2-3x weekly  Also check blood sugars about 2 hours after a meal and do this after different meals by rotation  Recommended blood sugar levels on waking up is 90-130 and about 2 hours after meal is 130-160  Please bring your blood sugar monitor to each visit, thank you  Glimeperide 1 1/2 tabs at dinner  Flu shot in October

## 2015-12-26 ENCOUNTER — Encounter: Payer: Self-pay | Admitting: *Deleted

## 2015-12-27 ENCOUNTER — Ambulatory Visit (INDEPENDENT_AMBULATORY_CARE_PROVIDER_SITE_OTHER): Payer: Medicare Other | Admitting: General Practice

## 2015-12-27 DIAGNOSIS — I4891 Unspecified atrial fibrillation: Secondary | ICD-10-CM | POA: Diagnosis not present

## 2015-12-27 DIAGNOSIS — Z5181 Encounter for therapeutic drug level monitoring: Secondary | ICD-10-CM

## 2015-12-27 LAB — POCT INR: INR: 2.3

## 2015-12-27 NOTE — Progress Notes (Signed)
I have reviewed and agree with the plan. 

## 2016-01-15 ENCOUNTER — Other Ambulatory Visit: Payer: Self-pay | Admitting: Endocrinology

## 2016-01-31 ENCOUNTER — Ambulatory Visit (INDEPENDENT_AMBULATORY_CARE_PROVIDER_SITE_OTHER): Payer: Medicare Other | Admitting: General Practice

## 2016-01-31 DIAGNOSIS — Z5181 Encounter for therapeutic drug level monitoring: Secondary | ICD-10-CM | POA: Diagnosis not present

## 2016-01-31 DIAGNOSIS — I4891 Unspecified atrial fibrillation: Secondary | ICD-10-CM

## 2016-01-31 DIAGNOSIS — Z23 Encounter for immunization: Secondary | ICD-10-CM

## 2016-01-31 LAB — POCT INR: INR: 3

## 2016-01-31 NOTE — Progress Notes (Signed)
I have reviewed and agree with the plan. 

## 2016-02-28 ENCOUNTER — Ambulatory Visit (INDEPENDENT_AMBULATORY_CARE_PROVIDER_SITE_OTHER): Payer: Medicare Other | Admitting: General Practice

## 2016-02-28 DIAGNOSIS — Z5181 Encounter for therapeutic drug level monitoring: Secondary | ICD-10-CM | POA: Diagnosis not present

## 2016-02-28 LAB — POCT INR: INR: 1.9

## 2016-02-28 NOTE — Patient Instructions (Signed)
Pre visit review using our clinic review tool, if applicable. No additional management support is needed unless otherwise documented below in the visit note. 

## 2016-02-28 NOTE — Progress Notes (Signed)
I have reviewed and agree with the plan. 

## 2016-03-16 ENCOUNTER — Telehealth: Payer: Self-pay | Admitting: Endocrinology

## 2016-03-16 NOTE — Telephone Encounter (Signed)
Refill of glimepiride (AMARYL) 2 MG tablet, sitaGLIPtin-metformin (JANUMET) 50-1000 MG tablet  [facility] - [place] - [address]

## 2016-03-17 ENCOUNTER — Other Ambulatory Visit: Payer: Self-pay

## 2016-03-17 MED ORDER — GLIMEPIRIDE 2 MG PO TABS: ORAL_TABLET | ORAL | 3 refills | Status: DC

## 2016-03-17 MED ORDER — SITAGLIPTIN PHOS-METFORMIN HCL 50-1000 MG PO TABS: 1.0000 | ORAL_TABLET | Freq: Two times a day (BID) | ORAL | 3 refills | Status: DC

## 2016-03-17 MED ORDER — ATORVASTATIN CALCIUM 10 MG PO TABS: 10.0000 mg | ORAL_TABLET | Freq: Every day | ORAL | 3 refills | Status: DC

## 2016-03-17 NOTE — Telephone Encounter (Signed)
And she need medication atorvastatin

## 2016-03-17 NOTE — Telephone Encounter (Signed)
Ordered 03/17/16 

## 2016-03-27 ENCOUNTER — Ambulatory Visit (INDEPENDENT_AMBULATORY_CARE_PROVIDER_SITE_OTHER): Payer: Medicare Other | Admitting: General Practice

## 2016-03-27 DIAGNOSIS — Z5181 Encounter for therapeutic drug level monitoring: Secondary | ICD-10-CM

## 2016-03-27 DIAGNOSIS — I4891 Unspecified atrial fibrillation: Secondary | ICD-10-CM

## 2016-03-27 LAB — POCT INR: INR: 2.2

## 2016-03-27 NOTE — Patient Instructions (Signed)
Pre visit review using our clinic review tool, if applicable. No additional management support is needed unless otherwise documented below in the visit note. 

## 2016-04-17 ENCOUNTER — Other Ambulatory Visit: Payer: Medicare Other

## 2016-04-21 ENCOUNTER — Ambulatory Visit: Payer: Medicare Other | Admitting: Endocrinology

## 2016-04-24 ENCOUNTER — Ambulatory Visit: Payer: Medicare Other

## 2016-05-06 ENCOUNTER — Ambulatory Visit: Payer: Medicare Other

## 2016-05-20 ENCOUNTER — Ambulatory Visit (INDEPENDENT_AMBULATORY_CARE_PROVIDER_SITE_OTHER): Payer: Medicare Other | Admitting: General Practice

## 2016-05-20 DIAGNOSIS — Z5181 Encounter for therapeutic drug level monitoring: Secondary | ICD-10-CM

## 2016-05-20 LAB — POCT INR: INR: 2.5

## 2016-05-20 NOTE — Progress Notes (Signed)
I have reviewed and agree with the plan. 

## 2016-05-20 NOTE — Patient Instructions (Signed)
Pre visit review using our clinic review tool, if applicable. No additional management support is needed unless otherwise documented below in the visit note. 

## 2016-06-17 ENCOUNTER — Ambulatory Visit (INDEPENDENT_AMBULATORY_CARE_PROVIDER_SITE_OTHER): Payer: Medicare Other

## 2016-06-17 DIAGNOSIS — Z5181 Encounter for therapeutic drug level monitoring: Secondary | ICD-10-CM

## 2016-06-17 LAB — POCT INR: INR: 2.2

## 2016-06-17 NOTE — Progress Notes (Signed)
I have reviewed and agree with the plan. 

## 2016-06-17 NOTE — Patient Instructions (Signed)
Pre visit review using our clinic review tool, if applicable. No additional management support is needed unless otherwise documented below in the visit note. 

## 2016-06-18 ENCOUNTER — Other Ambulatory Visit: Payer: Self-pay

## 2016-06-18 ENCOUNTER — Ambulatory Visit (INDEPENDENT_AMBULATORY_CARE_PROVIDER_SITE_OTHER): Payer: Medicare Other | Admitting: Endocrinology

## 2016-06-18 ENCOUNTER — Encounter: Payer: Self-pay | Admitting: Endocrinology

## 2016-06-18 ENCOUNTER — Telehealth: Payer: Self-pay | Admitting: Endocrinology

## 2016-06-18 VITALS — BP 126/92 | HR 109 | Ht 67.0 in | Wt 214.0 lb

## 2016-06-18 DIAGNOSIS — R600 Localized edema: Secondary | ICD-10-CM | POA: Diagnosis not present

## 2016-06-18 DIAGNOSIS — E1165 Type 2 diabetes mellitus with hyperglycemia: Secondary | ICD-10-CM

## 2016-06-18 DIAGNOSIS — I10 Essential (primary) hypertension: Secondary | ICD-10-CM | POA: Diagnosis not present

## 2016-06-18 DIAGNOSIS — R634 Abnormal weight loss: Secondary | ICD-10-CM | POA: Diagnosis not present

## 2016-06-18 DIAGNOSIS — E559 Vitamin D deficiency, unspecified: Secondary | ICD-10-CM

## 2016-06-18 LAB — COMPREHENSIVE METABOLIC PANEL
ALT: 9 U/L (ref 0–35)
AST: 12 U/L (ref 0–37)
Albumin: 4 g/dL (ref 3.5–5.2)
Alkaline Phosphatase: 48 U/L (ref 39–117)
BUN: 11 mg/dL (ref 6–23)
CALCIUM: 9.4 mg/dL (ref 8.4–10.5)
CHLORIDE: 104 meq/L (ref 96–112)
CO2: 27 meq/L (ref 19–32)
CREATININE: 1.14 mg/dL (ref 0.40–1.20)
GFR: 47.8 mL/min — AB (ref >60.00)
GLUCOSE: 107 mg/dL — AB (ref 70–99)
Potassium: 4.7 mEq/L (ref 3.5–5.1)
Sodium: 139 mEq/L (ref 135–145)
Total Bilirubin: 0.9 mg/dL (ref 0.2–1.2)
Total Protein: 7.6 g/dL (ref 6.0–8.3)

## 2016-06-18 LAB — LIPID PANEL
CHOL/HDL RATIO: 3
Cholesterol: 127 mg/dL (ref 0–200)
HDL: 50.6 mg/dL (ref >39.00)
LDL CALC: 56 mg/dL (ref 0–99)
NONHDL: 76.13
TRIGLYCERIDES: 100 mg/dL (ref 0.0–149.0)
VLDL: 20 mg/dL (ref 0.0–40.0)

## 2016-06-18 LAB — T4, FREE: FREE T4: 0.86 ng/dL (ref 0.60–1.60)

## 2016-06-18 LAB — TSH: TSH: 2.29 u[IU]/mL (ref 0.35–4.50)

## 2016-06-18 LAB — VITAMIN D 25 HYDROXY (VIT D DEFICIENCY, FRACTURES): VITD: 35.54 ng/mL (ref 30.00–100.00)

## 2016-06-18 LAB — POCT GLYCOSYLATED HEMOGLOBIN (HGB A1C): Hemoglobin A1C: 6.5

## 2016-06-18 MED ORDER — GLIMEPIRIDE 2 MG PO TABS: ORAL_TABLET | ORAL | 3 refills | Status: DC

## 2016-06-18 MED ORDER — ATORVASTATIN CALCIUM 10 MG PO TABS: 10.0000 mg | ORAL_TABLET | Freq: Every day | ORAL | 3 refills | Status: DC

## 2016-06-18 MED ORDER — BISOPROLOL-HYDROCHLOROTHIAZIDE 10-6.25 MG PO TABS: 1.0000 | ORAL_TABLET | Freq: Every day | ORAL | 3 refills | Status: DC

## 2016-06-18 NOTE — Telephone Encounter (Signed)
She is out of bisoprolol ok to order

## 2016-06-18 NOTE — Addendum Note (Signed)
Addended by: Adline MangoSTONE-ELMORE, Jacquis Paxton I on: 06/18/2016 02:54 PM   Modules accepted: Orders

## 2016-06-18 NOTE — Telephone Encounter (Signed)
Patient was supposed to call back and verify what medications she is missing. She did not know whether she was out of the atorvastatin or bisoprolol.  Please confirm

## 2016-06-18 NOTE — Progress Notes (Addendum)
Susan Potter 83 y.o.            Reason for Appointment:   followup  History of Present Illness   Diagnosis: Type 2 DIABETES MELITUS, date of diagnosis: 2002   She is being seen for follow up of Type 2 diabetes. This has been usually mild and well-controlled especially for her age  She has not been seen in follow-up for over 6 months now  Current oral hypoglycemic regimen:.  Amaryl 2 mg, 1 tablets at dinner, Janumet 50/1000, 1 tablet in a.m.  Her A1c generally had been around 7% and is surprisingly lower at 6.5 She has also lost weight and she does not know why She did not bring her monitor for download She checks blood sugars only randomly when she thinks about it and mostly in the morning Her fasting blood sugars appear to be not as high Usually compliant with her diet with portion control  She is not active because of back pain She was supposed to take 3 mg Amaryl at suppertime but is only taking 2 mg again, this was recommended when her fasting readings were higher in the last visit  Side effects from medications: None  Glucometer: One Touch.  Blood Glucose readings: 130-160  Hypoglycemia frequency: None recently    Food preferences: Avoiding sweets, cereal and large portions, getting an egg in am for breakfast sometimes, eating more fruit. Physical activity: a little walking at times  Diabetes complications: None  Wt Readings from Last 3 Encounters:  06/18/16 214 lb (97.1 kg)  12/16/15 220 lb (99.8 kg)  08/16/15 221 lb 6.4 oz (100.4 kg)    Lab Results  Component Value Date   HGBA1C 6.9 (H) 12/11/2015   HGBA1C 6.9 08/16/2015   HGBA1C 6.9 (H) 04/16/2015   Lab Results  Component Value Date   MICROALBUR 4.5 (H) 12/11/2015   LDLCALC 60 08/16/2015   CREATININE 1.13 12/11/2015      HYPERTENSION:    Her blood pressure is Not controlled . She is supposed to be on 10 mg Ziac  However not clear if she is taking this  ANTICOAGULATION for atrial fibrillation:  Currently followed in Coumadin clinic and last INR was 2.2  She has had long-standing atrial fibrillation without symptoms Does not complain of palpitations  HYPERLIPIDEMIA: Previously good control, she has been taking Lipitor for does not know if she is taking this recently   Lab Results  Component Value Date   CHOL 124 08/16/2015   HDL 45.10 08/16/2015   LDLCALC 60 08/16/2015   LDLDIRECT 84.6 01/08/2014   TRIG 91.0 08/16/2015   CHOLHDL 3 08/16/2015       Allergies as of 06/18/2016   No Known Allergies     Medication List       Accurate as of 06/18/16 11:18 AM. Always use your most recent med list.          atorvastatin 10 MG tablet Commonly known as:  LIPITOR Take 1 tablet (10 mg total) by mouth daily.   bisoprolol-hydrochlorothiazide 10-6.25 MG tablet Commonly known as:  ZIAC Take 1 tablet by mouth daily.   COUMADIN 5 MG tablet Generic drug:  warfarin take 1 tablet by mouth once daily   glimepiride 2 MG tablet Commonly known as:  AMARYL take 1.5 tablets by mouth once daily AT DINNER TIME   glucose blood test strip One touch Ultra 2 test strips only   ONE TOUCH ULTRA SYSTEM KIT w/Device Kit One touch Ultra 2 Glucometer  OVER THE COUNTER MEDICATION Place 1 drop into both eyes daily as needed (dry eyes/itching).   raloxifene 60 MG tablet Commonly known as:  EVISTA Take 60 mg by mouth daily.   sitaGLIPtin-metformin 50-1000 MG tablet Commonly known as:  JANUMET Take 1 tablet by mouth 2 (two) times daily with a meal.   Vitamin D (Ergocalciferol) 50000 units Caps capsule Commonly known as:  DRISDOL take 1 capsule by mouth every week ON SUNDAYS       Allergies: No Known Allergies  Past Medical History:  Diagnosis Date  . Atrial fibrillation ([place])   . Diabetes mellitus without complication ([place])   . Hypertension   . Osteoporosis   . Retinal vein occlusion     No past surgical history on file.  Family History  Problem Relation Age of Onset  .  Heart disease Brother     Social History:  reports that she has never smoked. She has never used smokeless tobacco. She reports that she does not drink alcohol. Her drug history is not on file.  Review of Systems -   Osteoporosis: Taking Evista  She thinks she is taking her vitamin D regularly but need to confirm her level now   Renal insufficiency: Present in the past, needs periodic follow-up  Lab Results  Component Value Date   CREATININE 1.13 12/11/2015        Some back pain/fatigability present on standing or walking for long, does not take any analgesics    Examination:   BP (!) 126/92   Pulse (!) 109   Ht '5\' 7"'  (1.702 m)   Wt 214 lb (97.1 kg)   SpO2 95%   BMI 33.52 kg/m   Body mass index is 33.52 kg/m.   Heart rate irregular and about 104 No abnormal sounds Lungs clear No tenderness of the spine She has 2+ edema of the left lower leg and foot. No redness or tenderness of the calf area Homans sign is negative Skin is normal on her feet including plantar surfaces  Diabetic Foot Exam - Simple   Simple Foot Form Diabetic Foot exam was performed with the following findings:  Yes   Visual Inspection No deformities, no ulcerations, no other skin breakdown bilaterally:  Yes Sensation Testing Intact to touch and monofilament testing bilaterally:  Yes Pulse Check Posterior Tibialis and Dorsalis pulse intact bilaterally:  Yes See comments:  Yes Comments Left dorsalis pedis 1+, posterior tibialis not palpable     Assesment/Plan:   1. Diabetes type 2, with  obesity   The patient's diabetes is reasonably controlled and A1c has gone down This is related to her weight loss although unclear why she is losing weight Even though she was supposed to take 3 mg of Amaryl she is again taking only 2 mg Fasting readings are reportedly fairly good at home compared to before For now she will continue the same regimen   2. Long-standing atrial fibrillation: Rate is not  controlled and it is unclear whether she is taking her  beta blocker.  Also because of her weight loss  need to rule out hyperthyroidism   3.  HYPERTENSION: Blood pressure is usually controlled on Ziac 10 mg.; however blood pressure is higher and not clear if this is from her running out of medications  4. Chronic mild renal dysfunction: Needs follow-up labs  5.  Vitamin D deficiency: She has taken her weekly dose, will recheck her level.  No symptoms of osteoporosis or back pain that is  new  6.  Left lower leg edema, this is new and will need to rule out DVT, Doppler ultrasound ordered  7.  Hyperlipidemia: Check labs today.  She does not know if she is taking her atorvastatin regularly    Nazire Fruth 06/18/2016, 11:18 AM    ADDENDUM: She apparently has not been taking her bisoprolol which explains her rapid heart rate and increased blood pressure, new prescription sent Thyroid levels are normal

## 2016-06-18 NOTE — Telephone Encounter (Signed)
Ordered

## 2016-07-09 ENCOUNTER — Telehealth: Payer: Self-pay

## 2016-07-09 ENCOUNTER — Other Ambulatory Visit: Payer: Self-pay | Admitting: Endocrinology

## 2016-07-09 NOTE — Telephone Encounter (Signed)
Refill error, corrected.

## 2016-07-15 ENCOUNTER — Ambulatory Visit (INDEPENDENT_AMBULATORY_CARE_PROVIDER_SITE_OTHER): Payer: Medicare Other | Admitting: General Practice

## 2016-07-15 DIAGNOSIS — Z5181 Encounter for therapeutic drug level monitoring: Secondary | ICD-10-CM | POA: Diagnosis not present

## 2016-07-15 DIAGNOSIS — I4891 Unspecified atrial fibrillation: Secondary | ICD-10-CM

## 2016-07-15 LAB — POCT INR: INR: 2

## 2016-07-15 NOTE — Patient Instructions (Signed)
Pre visit review using our clinic review tool, if applicable. No additional management support is needed unless otherwise documented below in the visit note. 

## 2016-07-16 ENCOUNTER — Encounter: Payer: Self-pay | Admitting: Endocrinology

## 2016-07-16 ENCOUNTER — Ambulatory Visit (INDEPENDENT_AMBULATORY_CARE_PROVIDER_SITE_OTHER): Payer: Medicare Other | Admitting: Endocrinology

## 2016-07-16 VITALS — BP 150/78 | HR 86 | Ht 67.0 in | Wt 219.0 lb

## 2016-07-16 DIAGNOSIS — I10 Essential (primary) hypertension: Secondary | ICD-10-CM | POA: Diagnosis not present

## 2016-07-16 DIAGNOSIS — E119 Type 2 diabetes mellitus without complications: Secondary | ICD-10-CM

## 2016-07-16 MED ORDER — AMLODIPINE BESYLATE 2.5 MG PO TABS: 2.5000 mg | ORAL_TABLET | Freq: Every day | ORAL | 3 refills | Status: DC

## 2016-07-16 NOTE — Progress Notes (Addendum)
Susan Potter 83 y.o.            Reason for Appointment:   followup of various issues   History of Present Illness   Diagnosis: Type 2 DIABETES MELITUS, date of diagnosis: 2002   She is being seen for follow up of Type 2 diabetes. This has been usually mild and well-controlled especially for her age   Current oral hypoglycemic regimen:.  Amaryl 2 mg, 1 tablets at dinner, Janumet 50/1000, 1 tablet in a.m.  Her A1c generally had been around 7% and is recently lower at 6.5 She did not bring her monitor for download  She checks blood sugars only randomly when she thinks about it and mostly in the afternoon with recent range 130-170 Usually compliant with her diet with portion control  She is not active because of back pain She is taking 2 mg Amaryl  Side effects from medications: None  Glucometer: One Touch.  Blood Glucose readings: 130-160  Hypoglycemia frequency: None recently    Food preferences: Avoiding sweets, cereal and large portions, getting an egg in am for breakfast sometimes, eating more fruit. Physical activity: a little walking at times  Diabetes complications: None  Wt Readings from Last 3 Encounters:  07/16/16 219 lb (99.3 kg)  06/18/16 214 lb (97.1 kg)  12/16/15 220 lb (99.8 kg)    Lab Results  Component Value Date   HGBA1C 6.5 06/18/2016   HGBA1C 6.9 (H) 12/11/2015   HGBA1C 6.9 08/16/2015   Lab Results  Component Value Date   MICROALBUR 4.5 (H) 12/11/2015   LDLCALC 56 06/18/2016   CREATININE 1.14 06/18/2016      HYPERTENSION:    Her blood pressure is Still not controlled . She is supposed to be on 10 mg Ziac And apparently she thinks she is taking it now On her last visit she had run out of the prescription and did not call for refill   BP Readings from Last 3 Encounters:  07/16/16 (!) 150/90  06/18/16 (!) 126/92  12/16/15 130/80    EDEMA: On our last visit she was complaining of swelling of the left lower leg, it is apparently  improved spontaneously now She did not go for the Doppler ultrasound as directed  ANTICOAGULATION for atrial fibrillation: Currently followed in Coumadin clinic and last INR was 2.2  She has had long-standing atrial fibrillation without symptoms Does not complain of palpitations  HYPERLIPIDEMIA: Has good control, she has been taking Lipitor   Lab Results  Component Value Date   CHOL 127 06/18/2016   HDL 50.60 06/18/2016   LDLCALC 56 06/18/2016   LDLDIRECT 84.6 01/08/2014   TRIG 100.0 06/18/2016   CHOLHDL 3 06/18/2016       Allergies as of 07/16/2016   No Known Allergies     Medication List       Accurate as of 07/16/16  8:30 AM. Always use your most recent med list.          atorvastatin 10 MG tablet Commonly known as:  LIPITOR Take 1 tablet (10 mg total) by mouth daily.   bisoprolol-hydrochlorothiazide 10-6.25 MG tablet Commonly known as:  ZIAC Take 1 tablet by mouth daily.   COUMADIN 5 MG tablet Generic drug:  warfarin take 1 tablet by mouth once daily   glimepiride 2 MG tablet Commonly known as:  AMARYL take 1.5 tablets by mouth once daily AT DINNER TIME   glucose blood test strip One touch Ultra 2 test strips only   ONE  TOUCH ULTRA SYSTEM KIT w/Device Kit One touch Ultra 2 Glucometer   OVER THE COUNTER MEDICATION Place 1 drop into both eyes daily as needed (dry eyes/itching).   raloxifene 60 MG tablet Commonly known as:  EVISTA Take 60 mg by mouth daily.   sitaGLIPtin-metformin 50-1000 MG tablet Commonly known as:  JANUMET Take 1 tablet by mouth 2 (two) times daily with a meal.   Vitamin D (Ergocalciferol) 50000 units Caps capsule Commonly known as:  DRISDOL take 1 capsule by mouth every week ON SUNDAYS       Allergies: No Known Allergies  Past Medical History:  Diagnosis Date  . Atrial fibrillation ([place])   . Diabetes mellitus without complication ([place])   . Hypertension   . Osteoporosis   . Retinal vein occlusion     No past  surgical history on file.  Family History  Problem Relation Age of Onset  . Heart disease Brother     Social History:  reports that she has never smoked. She has never used smokeless tobacco. She reports that she does not drink alcohol. Her drug history is not on file.  Review of Systems -   Osteoporosis: Taking Evista  She thinks she is taking her vitamin D regularly    Renal insufficiency: Present in the past, near normal now   Lab Results  Component Value Date   CREATININE 1.14 06/18/2016        Some back pain/fatigability present on standing or walking for long, does not take any analgesics    Examination:   BP (!) 150/90   Pulse 86   Ht '5\' 7"'  (1.702 m)   Wt 219 lb (99.3 kg)   SpO2 95%   BMI 34.30 kg/m   Body mass index is 34.3 kg/m.   Heart rate irregular and about 76 No edema present on her left leg   Assesment/Plan:   1. Diabetes type 2, with  obesity   She reports fairly good blood sugars at home and will continue same regimen  2. Long-standing atrial fibrillation: Rate is Now controlled with resuming beta blocker  3.  HYPERTENSION: Blood pressure is usually controlled on Ziac 10 mg.; however blood pressure is still high. Will start her on 2.5 mg amlodipine and follow-up on the next visit  4.   Left lower leg edema, this is resolved, not clear of the etiology     Kassondra Geil 07/16/2016, 8:30 AM

## 2016-08-16 NOTE — Progress Notes (Signed)
Susan Potter 83 y.o.            Reason for Appointment:   followup of various issues   History of Present Illness     HYPERTENSION:    Her blood pressure is Still not controlled . She is supposed to be on 10 mg Ziac and 2.5 mg amlodipine was started about a month ago She did not take the amlodipine after Saturday as she ran out However blood pressure still seems high and she thinks she is taking bisoprolol HCT consistently  BP Readings from Last 3 Encounters:  08/17/16 (!) 150/74  07/16/16 (!) 150/78  06/18/16 (!) 126/92     Diagnosis: Type 2 DIABETES MELITUS, date of diagnosis: 2002   She is being seen for follow up of Type 2 diabetes.  This has been usually mild And not progressive   Current oral hypoglycemic regimen:.  Amaryl 2 mg, 1 tablets at dinner, Janumet 50/1000, 1 tablet in a.m.  Her A1c generally had been around 6.5%-7% She did bring her monitor for download  She checks blood sugars only Sporadically and none for the last 3 weeks Has only 4 readings over the last month Usually compliant with her diet her weight coming down recently  She is not active because of Limitations from back pain She is taking 2 mg Amaryl  Side effects from medications: None  Glucometer: One Touch.  Blood Glucose readings: Recent range 142-189 in the mornings, no readings since 07/24/16  Hypoglycemia frequency: None recently    Food preferences: Avoiding sweets, cereal and large portions, getting an egg in am for breakfast sometimes, eating more fruit. Physical activity: a little walking at times  Diabetes complications: None  Wt Readings from Last 3 Encounters:  08/17/16 196 lb 9.6 oz (89.2 kg)  07/16/16 219 lb (99.3 kg)  06/18/16 214 lb (97.1 kg)    Lab Results  Component Value Date   HGBA1C 6.5 06/18/2016   HGBA1C 6.9 (H) 12/11/2015   HGBA1C 6.9 08/16/2015   Lab Results  Component Value Date   MICROALBUR 4.5 (H) 12/11/2015   LDLCALC 56 06/18/2016   CREATININE  1.14 06/18/2016        BP Readings from Last 3 Encounters:  08/17/16 (!) 150/74  07/16/16 (!) 150/78  06/18/16 (!) 126/92     ANTICOAGULATION for atrial fibrillation: Currently followed in Coumadin clinic and last INR was 2.2  She has had long-standing atrial fibrillation without symptoms Does not complain of palpitations  HYPERLIPIDEMIA: Has good control, she has been taking Lipitor   Lab Results  Component Value Date   CHOL 127 06/18/2016   HDL 50.60 06/18/2016   LDLCALC 56 06/18/2016   LDLDIRECT 84.6 01/08/2014   TRIG 100.0 06/18/2016   CHOLHDL 3 06/18/2016       Allergies as of 08/17/2016   No Known Allergies     Medication List       Accurate as of 08/17/16  8:17 AM. Always use your most recent med list.          amLODipine 5 MG tablet Commonly known as:  NORVASC Take 1 tablet (5 mg total) by mouth daily.   atorvastatin 10 MG tablet Commonly known as:  LIPITOR Take 1 tablet (10 mg total) by mouth daily.   bisoprolol-hydrochlorothiazide 10-6.25 MG tablet Commonly known as:  ZIAC Take 1 tablet by mouth daily.   COUMADIN 5 MG tablet Generic drug:  warfarin take 1 tablet by mouth once daily   glimepiride 2  MG tablet Commonly known as:  AMARYL take 1.5 tablets by mouth once daily AT DINNER TIME   glucose blood test strip One touch Ultra 2 test strips only   ONE TOUCH ULTRA SYSTEM KIT w/Device Kit One touch Ultra 2 Glucometer   OVER THE COUNTER MEDICATION Place 1 drop into both eyes daily as needed (dry eyes/itching).   raloxifene 60 MG tablet Commonly known as:  EVISTA Take 60 mg by mouth daily.   sitaGLIPtin-metformin 50-1000 MG tablet Commonly known as:  JANUMET Take 1 tablet by mouth 2 (two) times daily with a meal.   Vitamin D (Ergocalciferol) 50000 units Caps capsule Commonly known as:  DRISDOL take 1 capsule by mouth every week ON SUNDAYS       Allergies: No Known Allergies  Past Medical History:  Diagnosis Date  . Atrial  fibrillation ([place])   . Diabetes mellitus without complication ([place])   . Hypertension   . Osteoporosis   . Retinal vein occlusion     No past surgical history on file.  Family History  Problem Relation Age of Onset  . Heart disease Brother     Social History:  reports that she has never smoked. She has never used smokeless tobacco. She reports that she does not drink alcohol. Her drug history is not on file.  Review of Systems -   Osteoporosis: Taking Evista  She is taking her vitamin D regularly    Renal insufficiency: Present in the past, near normal more recently   Lab Results  Component Value Date   CREATININE 1.14 06/18/2016        Some back pain/fatigability present on standing or walking for long, Not worse    Examination:   BP (!) 150/74   Pulse 83   Ht _0  (1.702 m)   Wt 196 lb 9.6 oz (89.2 kg)   SpO2 96%   BMI 30.79 kg/m   Body mass index is 30.79 kg/m.   Only trace edema present on her left lower leg   Assesment/Plan:   1. Diabetes type 2, with  obesity   She still appears to have fairly good blood sugars although has not checked for the last 3 weeks Will need follow-up A1c on the next visit reports fairly good blood sugars at home and will continue same regimen  2. Long-standing atrial fibrillation: Rate is  controlled and will continue beta blocker  3.  HYPERTENSION: Blood pressure is still not controlled with her systolic running high Although she has not taken her amlodipine yesterday most likely she will need 5 mg daily since her systolic reading is not improved yet She will continue bisoprolol also    Mihika Surrette 08/17/2016, 8:17 AM

## 2016-08-17 ENCOUNTER — Ambulatory Visit (INDEPENDENT_AMBULATORY_CARE_PROVIDER_SITE_OTHER): Payer: Medicare Other | Admitting: Endocrinology

## 2016-08-17 ENCOUNTER — Encounter: Payer: Self-pay | Admitting: Endocrinology

## 2016-08-17 VITALS — BP 150/74 | HR 83 | Ht 67.0 in | Wt 196.6 lb

## 2016-08-17 DIAGNOSIS — I10 Essential (primary) hypertension: Secondary | ICD-10-CM | POA: Diagnosis not present

## 2016-08-17 MED ORDER — AMLODIPINE BESYLATE 5 MG PO TABS: 5.0000 mg | ORAL_TABLET | Freq: Every day | ORAL | 3 refills | Status: DC

## 2016-08-26 ENCOUNTER — Ambulatory Visit (INDEPENDENT_AMBULATORY_CARE_PROVIDER_SITE_OTHER): Payer: Medicare Other | Admitting: General Practice

## 2016-08-26 DIAGNOSIS — Z5181 Encounter for therapeutic drug level monitoring: Secondary | ICD-10-CM | POA: Diagnosis not present

## 2016-08-26 DIAGNOSIS — I4891 Unspecified atrial fibrillation: Secondary | ICD-10-CM

## 2016-08-26 LAB — POCT INR: INR: 2.4

## 2016-08-26 NOTE — Patient Instructions (Signed)
Pre visit review using our clinic review tool, if applicable. No additional management support is needed unless otherwise documented below in the visit note. 

## 2016-08-26 NOTE — Progress Notes (Signed)
I have reviewed and agree with the plan. 

## 2016-09-25 ENCOUNTER — Other Ambulatory Visit (INDEPENDENT_AMBULATORY_CARE_PROVIDER_SITE_OTHER): Payer: Medicare Other

## 2016-09-25 DIAGNOSIS — E119 Type 2 diabetes mellitus without complications: Secondary | ICD-10-CM | POA: Diagnosis not present

## 2016-09-25 LAB — COMPREHENSIVE METABOLIC PANEL
ALBUMIN: 4.1 g/dL (ref 3.5–5.2)
ALT: 9 U/L (ref 0–35)
AST: 12 U/L (ref 0–37)
Alkaline Phosphatase: 40 U/L (ref 39–117)
BILIRUBIN TOTAL: 0.9 mg/dL (ref 0.2–1.2)
BUN: 22 mg/dL (ref 6–23)
CALCIUM: 9.7 mg/dL (ref 8.4–10.5)
CO2: 26 mEq/L (ref 19–32)
CREATININE: 1.32 mg/dL — AB (ref 0.40–1.20)
Chloride: 102 mEq/L (ref 96–112)
GFR: 40.33 mL/min — AB (ref >60.00)
Glucose, Bld: 175 mg/dL — ABNORMAL HIGH (ref 70–99)
Potassium: 4.2 mEq/L (ref 3.5–5.1)
Sodium: 138 mEq/L (ref 135–145)
Total Protein: 7.5 g/dL (ref 6.0–8.3)

## 2016-09-25 LAB — HEMOGLOBIN A1C: Hgb A1c MFr Bld: 7.4 % — ABNORMAL HIGH (ref 4.6–6.5)

## 2016-09-25 LAB — MICROALBUMIN / CREATININE URINE RATIO
CREATININE, U: 60.5 mg/dL
MICROALB UR: 1.2 mg/dL (ref 0.0–1.9)
MICROALB/CREAT RATIO: 2 mg/g (ref 0.0–30.0)

## 2016-09-29 NOTE — Progress Notes (Signed)
Susan Potter 83 y.o.            Reason for Appointment:   followup of various issues   History of Present Illness     HYPERTENSION:    Her blood pressure has been more difficult to control  She is taking her medication regularly She is currently on 10 mg Ziac and 5 mg amlodipine, the latter was increased on her last visit She did not take her medications as yet this morning, usually takes them after breakfast at 9 AM   BP Readings from Last 3 Encounters:  09/30/16 118/76  08/17/16 (!) 150/74  07/16/16 (!) 150/78     Diagnosis: Type 2 DIABETES MELITUS, date of diagnosis: 2002   She is being seen for follow up of Type 2 diabetes.  This has been  mild   Current oral hypoglycemic regimen:.  Amaryl 2 mg, 1 tablets at dinner, Janumet 50/1000, 1 tablet in a.m.  Her A1c generally had been around 6.5%-7% But relatively higher at 7.4 now She did bring her monitor for download Lab glucose was 175, possibly after breakfast She checks blood sugars only Sporadically  Usually compliant with her diet  Not able to lose weight  She is not active because of  back pain She is taking 2 mg Amaryl at suppertime without hypoglycemia  Side effects from medications: None  Glucometer: One Touch.  Blood Glucose readings: Recent range 130-155    Food preferences: Avoiding sweets, cereal and large portions, getting an egg in am for breakfast sometimes, eating more fruit. Physical activity: a little walking at home  Diabetes complications: None  Wt Readings from Last 3 Encounters:  09/30/16 220 lb (99.8 kg)  08/17/16 196 lb 9.6 oz (89.2 kg)  07/16/16 219 lb (99.3 kg)    Lab Results  Component Value Date   HGBA1C 7.4 (H) 09/25/2016   HGBA1C 6.5 06/18/2016   HGBA1C 6.9 (H) 12/11/2015   Lab Results  Component Value Date   MICROALBUR 1.2 09/25/2016   LDLCALC 56 06/18/2016   CREATININE 1.32 (H) 09/25/2016        BP Readings from Last 3 Encounters:  09/30/16 118/76    08/17/16 (!) 150/74  07/16/16 (!) 150/78     ANTICOAGULATION for atrial fibrillation: Currently followed in Coumadin clinic and last INR was 2.2  She has had long-standing atrial fibrillation without symptoms Does not complain of palpitations  HYPERLIPIDEMIA: Has good control, she has been taking Lipitor   Lab Results  Component Value Date   CHOL 127 06/18/2016   HDL 50.60 06/18/2016   LDLCALC 56 06/18/2016   LDLDIRECT 84.6 01/08/2014   TRIG 100.0 06/18/2016   CHOLHDL 3 06/18/2016       Allergies as of 09/30/2016   No Known Allergies     Medication List       Accurate as of 09/30/16  8:31 AM. Always use your most recent med list.          amLODipine 5 MG tablet Commonly known as:  NORVASC Take 1 tablet (5 mg total) by mouth daily.   atorvastatin 10 MG tablet Commonly known as:  LIPITOR Take 1 tablet (10 mg total) by mouth daily.   bisoprolol-hydrochlorothiazide 10-6.25 MG tablet Commonly known as:  ZIAC Take 1 tablet by mouth daily.   COUMADIN 5 MG tablet Generic drug:  warfarin take 1 tablet by mouth once daily   glimepiride 2 MG tablet Commonly known as:  AMARYL take 1.5 tablets by mouth once  daily AT DINNER TIME   glucose blood test strip One touch Ultra 2 test strips only   ONE TOUCH ULTRA SYSTEM KIT w/Device Kit One touch Ultra 2 Glucometer   OVER THE COUNTER MEDICATION Place 1 drop into both eyes daily as needed (dry eyes/itching).   raloxifene 60 MG tablet Commonly known as:  EVISTA Take 60 mg by mouth daily.   sitaGLIPtin-metformin 50-1000 MG tablet Commonly known as:  JANUMET Take 1 tablet by mouth 2 (two) times daily with a meal.   Vitamin D (Ergocalciferol) 50000 units Caps capsule Commonly known as:  DRISDOL take 1 capsule by mouth every week ON SUNDAYS       Allergies: No Known Allergies  Past Medical History:  Diagnosis Date  . Atrial fibrillation ([place])   . Diabetes mellitus without complication ([place])   . Hypertension    . Osteoporosis   . Retinal vein occlusion     No past surgical history on file.  Family History  Problem Relation Age of Onset  . Heart disease Brother     Social History:  reports that she has never smoked. She has never used smokeless tobacco. She reports that she does not drink alcohol. Her drug history is not on file.  Review of Systems -   Osteoporosis: Taking Evista Long-term  She is taking her vitamin D As indicated  Renal insufficiency: Present in the past, Creatinine relatively higher today   Lab Results  Component Value Date   CREATININE 1.32 (H) 09/25/2016        Some back pain/fatigability present on standing or walking for long, Does better with the shopping cart     Examination:   BP 118/76 (Cuff Size: Large)   Pulse 73   Ht '5\' 7"'  (1.702 m)   Wt 220 lb (99.8 kg)   SpO2 97%   BMI 34.46 kg/m   Body mass index is 34.46 kg/m.   1+ edema lower legs especially right   Assesment/Plan:   1. Diabetes type 2, with  obesity   Her A1c is higher than usual at 7.4 Again she is inconsistent at home monitoring Will increase her Amaryl to 1-1/2 tablets at dinnertime and she will need to check more readings at home  2.  Renal dysfunction: This is likely to be slight be worse because of her blood pressure overall coming down Will follow  3.  HYPERTENSION: Blood pressure is better today with increasing her amlodipine However she is having mild edema with this This is asymptomatic  Return in 6 weeks    Remi Rester 09/30/2016, 8:31 AM

## 2016-09-30 ENCOUNTER — Encounter: Payer: Self-pay | Admitting: Endocrinology

## 2016-09-30 ENCOUNTER — Ambulatory Visit (INDEPENDENT_AMBULATORY_CARE_PROVIDER_SITE_OTHER): Payer: Medicare Other | Admitting: Endocrinology

## 2016-09-30 VITALS — BP 118/76 | HR 73 | Ht 67.0 in | Wt 220.0 lb

## 2016-09-30 DIAGNOSIS — I10 Essential (primary) hypertension: Secondary | ICD-10-CM

## 2016-09-30 DIAGNOSIS — E1169 Type 2 diabetes mellitus with other specified complication: Secondary | ICD-10-CM | POA: Diagnosis not present

## 2016-09-30 DIAGNOSIS — E669 Obesity, unspecified: Secondary | ICD-10-CM

## 2016-09-30 NOTE — Patient Instructions (Signed)
Glimeperide 1 1/2 tabs at dinner

## 2016-10-07 ENCOUNTER — Ambulatory Visit (INDEPENDENT_AMBULATORY_CARE_PROVIDER_SITE_OTHER): Payer: Medicare Other | Admitting: General Practice

## 2016-10-07 DIAGNOSIS — I4891 Unspecified atrial fibrillation: Secondary | ICD-10-CM

## 2016-10-07 DIAGNOSIS — Z5181 Encounter for therapeutic drug level monitoring: Secondary | ICD-10-CM | POA: Diagnosis not present

## 2016-10-07 LAB — POCT INR: INR: 1.9

## 2016-10-07 NOTE — Patient Instructions (Signed)
Pre visit review using our clinic review tool, if applicable. No additional management support is needed unless otherwise documented below in the visit note. 

## 2016-10-07 NOTE — Progress Notes (Signed)
I have reviewed and agree with the plan. 

## 2016-10-26 ENCOUNTER — Telehealth: Payer: Self-pay | Admitting: Endocrinology

## 2016-10-26 ENCOUNTER — Other Ambulatory Visit: Payer: Self-pay

## 2016-10-26 MED ORDER — BISOPROLOL-HYDROCHLOROTHIAZIDE 10-6.25 MG PO TABS: 1.0000 | ORAL_TABLET | Freq: Every day | ORAL | 3 refills | Status: DC

## 2016-10-26 NOTE — Telephone Encounter (Signed)
**  Remind patient they can make refill requests via MyChart**  Medication refill request (Name & Dosage): bisoprolol-hydrochlorothiazide (ZIAC) 10-6.25 MG tablet [[id number]][[id number]]     Preferred pharmacy (Name & Address):  [facility] [address] [place]- [place], [place] - [address] [phone] (Phone) [phone] (Fax)      Other comments (if applicable):   Patient is completely out of medication.

## 2016-10-26 NOTE — Telephone Encounter (Signed)
Called patient and let her know that I sent her refill in electronically and to call [facility] before she goes to pick it up to make sure it is ready for her.

## 2016-11-04 ENCOUNTER — Ambulatory Visit: Payer: Medicare Other

## 2016-11-06 ENCOUNTER — Ambulatory Visit (INDEPENDENT_AMBULATORY_CARE_PROVIDER_SITE_OTHER): Payer: Medicare Other | Admitting: General Practice

## 2016-11-06 ENCOUNTER — Other Ambulatory Visit (INDEPENDENT_AMBULATORY_CARE_PROVIDER_SITE_OTHER): Payer: Medicare Other

## 2016-11-06 DIAGNOSIS — E669 Obesity, unspecified: Secondary | ICD-10-CM

## 2016-11-06 DIAGNOSIS — E1169 Type 2 diabetes mellitus with other specified complication: Secondary | ICD-10-CM | POA: Diagnosis not present

## 2016-11-06 DIAGNOSIS — I4891 Unspecified atrial fibrillation: Secondary | ICD-10-CM

## 2016-11-06 DIAGNOSIS — Z5181 Encounter for therapeutic drug level monitoring: Secondary | ICD-10-CM

## 2016-11-06 LAB — BASIC METABOLIC PANEL
BUN: 13 mg/dL (ref 6–23)
CALCIUM: 9.7 mg/dL (ref 8.4–10.5)
CO2: 27 meq/L (ref 19–32)
Chloride: 103 mEq/L (ref 96–112)
Creatinine, Ser: 1.18 mg/dL (ref 0.40–1.20)
GFR: 45.89 mL/min — AB (ref >60.00)
GLUCOSE: 151 mg/dL — AB (ref 70–99)
Potassium: 4.3 mEq/L (ref 3.5–5.1)
SODIUM: 139 meq/L (ref 135–145)

## 2016-11-06 LAB — POCT INR: INR: 2.5

## 2016-11-06 NOTE — Progress Notes (Signed)
I have reviewed and agree with the plan. 

## 2016-11-07 LAB — FRUCTOSAMINE: FRUCTOSAMINE: 317 umol/L — AB (ref 0–285)

## 2016-11-10 NOTE — Progress Notes (Signed)
Susan Potter 83 y.o.            Reason for Appointment:   followup of various issues   History of Present Illness     HYPERTENSION:    Her blood pressure has been more difficult to control this year despite good compliance with her medications She is currently on 10 mg Ziac and 5 mg amlodipine She did start noticing some swelling of her legs after starting 5 mg dose of amlodipine However she does not think this is any worse or bothering her now  She did not take her medications as yet this morning, usually takes them after breakfast at 9 AM Blood pressure is well controlled again and she does not feel lightheaded either   BP Readings from Last 3 Encounters:  11/11/16 124/70  09/30/16 118/76  08/17/16 (!) 150/74     Diagnosis: Type 2 DIABETES MELITUS, date of diagnosis: 2002   She is being seen for follow up of Type 2 diabetes.  This has been  mild   Current oral hypoglycemic regimen:.  Amaryl 2 mg, 1 tablets at dinner, Janumet 50/1000, 1 tablet in a.m.  Her A1c generally had been around 6.5%-7% But relatively higher at 7.4 In June  She did not bring her monitor for download and does not check her sugar much Her Amaryl was increased by 1 mg on her last visit when her blood sugar was 175 Last glucose was 151, probably fasting Not able to lose weight, weight has gone up slightly again  She is not active because of  back pain She is taking 3 mg Amaryl at suppertime without hypoglycemia  Side effects from medications: None  Glucometer: One Touch.  Blood Glucose readings: none    Food preferences: Avoiding sweets, cereal and large portions, getting an egg in am for breakfast sometimes, eating more fruit. Physical activity: a little walking at home  Diabetes complications: None  Wt Readings from Last 3 Encounters:  11/11/16 224 lb (101.6 kg)  09/30/16 220 lb (99.8 kg)  08/17/16 196 lb 9.6 oz (89.2 kg)    Lab Results  Component Value Date   HGBA1C 7.4 (H)  09/25/2016   HGBA1C 6.5 06/18/2016   HGBA1C 6.9 (H) 12/11/2015   Lab Results  Component Value Date   MICROALBUR 1.2 09/25/2016   LDLCALC 56 06/18/2016   CREATININE 1.18 11/06/2016      BP Readings from Last 3 Encounters:  11/11/16 124/70  09/30/16 118/76  08/17/16 (!) 150/74    HYPERLIPIDEMIA: Has good control, she has been taking Lipitor   Lab Results  Component Value Date   CHOL 127 06/18/2016   HDL 50.60 06/18/2016   LDLCALC 56 06/18/2016   LDLDIRECT 84.6 01/08/2014   TRIG 100.0 06/18/2016   CHOLHDL 3 06/18/2016       Allergies as of 11/11/2016   No Known Allergies     Medication List       Accurate as of 11/11/16  8:08 AM. Always use your most recent med list.          amLODipine 5 MG tablet Commonly known as:  NORVASC Take 1 tablet (5 mg total) by mouth daily.   atorvastatin 10 MG tablet Commonly known as:  LIPITOR Take 1 tablet (10 mg total) by mouth daily.   bisoprolol-hydrochlorothiazide 10-6.25 MG tablet Commonly known as:  ZIAC Take 1 tablet by mouth daily.   COUMADIN 5 MG tablet Generic drug:  warfarin take 1 tablet by mouth once daily  glimepiride 2 MG tablet Commonly known as:  AMARYL take 1.5 tablets by mouth once daily AT DINNER TIME   glucose blood test strip One touch Ultra 2 test strips only   ONE TOUCH ULTRA SYSTEM KIT w/Device Kit One touch Ultra 2 Glucometer   OVER THE COUNTER MEDICATION Place 1 drop into both eyes daily as needed (dry eyes/itching).   raloxifene 60 MG tablet Commonly known as:  EVISTA Take 60 mg by mouth daily.   sitaGLIPtin-metformin 50-1000 MG tablet Commonly known as:  JANUMET Take 1 tablet by mouth 2 (two) times daily with a meal.   Vitamin D (Ergocalciferol) 50000 units Caps capsule Commonly known as:  DRISDOL take 1 capsule by mouth every week ON SUNDAYS       Allergies: No Known Allergies  Past Medical History:  Diagnosis Date  . Atrial fibrillation ([place])   . Diabetes mellitus  without complication ([place])   . Hypertension   . Osteoporosis   . Retinal vein occlusion     No past surgical history on file.  Family History  Problem Relation Age of Onset  . Heart disease Brother     Social History:  reports that she has never smoked. She has never used smokeless tobacco. She reports that she does not drink alcohol. Her drug history is not on file.  Review of Systems -   Osteoporosis: On Evista Long-term  She is taking her vitamin D and her last level was normal  Renal insufficiency: This has been fluctuating  last creatinine was 1.32 and now better    Lab Results  Component Value Date   CREATININE 1.18 11/06/2016        Chronic back pain/fatigability present on standing or walking for long     Examination:   BP 124/70   Pulse 79   Ht _0  (1.702 m)   Wt 224 lb (101.6 kg)   SpO2 95%   BMI 35.08 kg/m   Body mass index is 35.08 kg/m.   1+ edema lower legs especially Left, minimal edema around the ankles   Assesment/Plan:   1. Diabetes type 2, with  obesity   Her lab glucose was 151 fasting and she has not monitored at home She will continue her increased dose of Amaryl 1-1/2 tablets in the evening Encourage her to check regularly and also keep working on her diet, weight has gone up  2.  Renal dysfunction: This is now improved Will follow regularly  3.  HYPERTENSION: Blood pressure is now consistently controlled with keeping her on 5 mg amlodipine Again she is having mild edema with this but this is not symptomatic and variable  Return in 12 weeks    Cindra Austad 11/11/2016, 8:08 AM

## 2016-11-11 ENCOUNTER — Encounter: Payer: Self-pay | Admitting: Endocrinology

## 2016-11-11 ENCOUNTER — Ambulatory Visit (INDEPENDENT_AMBULATORY_CARE_PROVIDER_SITE_OTHER): Payer: Medicare Other | Admitting: Endocrinology

## 2016-11-11 VITALS — BP 124/70 | HR 79 | Ht 67.0 in | Wt 224.0 lb

## 2016-11-11 DIAGNOSIS — I10 Essential (primary) hypertension: Secondary | ICD-10-CM

## 2016-11-11 DIAGNOSIS — E1165 Type 2 diabetes mellitus with hyperglycemia: Secondary | ICD-10-CM

## 2016-11-12 ENCOUNTER — Other Ambulatory Visit: Payer: Self-pay | Admitting: Endocrinology

## 2016-11-13 ENCOUNTER — Telehealth: Payer: Self-pay | Admitting: Endocrinology

## 2016-11-13 ENCOUNTER — Other Ambulatory Visit: Payer: Self-pay

## 2016-11-13 MED ORDER — SITAGLIPTIN PHOS-METFORMIN HCL 50-1000 MG PO TABS
1.0000 | ORAL_TABLET | Freq: Two times a day (BID) | ORAL | 3 refills | Status: DC
Start: 2016-11-13 — End: 2016-11-30

## 2016-11-13 NOTE — Telephone Encounter (Signed)
**  Remind patient they can make refill requests via MyChart**  Medication refill request (Name & Dosage):  Janumet 50-1000 MG tab  Preferred pharmacy (Name & Address):  [facility] [address] [place]   Other comments (if applicable):

## 2016-11-13 NOTE — Telephone Encounter (Signed)
Called patient to let her know that [facility] [address] and to call to make sure it is ready before she picks it up.

## 2016-11-16 NOTE — Telephone Encounter (Signed)
Patient called in stating that [facility] still can not fill the Rx for sitaGLIPtin-metformin (JANUMET) 50-1000 MG tablet. Please call pharmacy and advise.

## 2016-11-30 ENCOUNTER — Other Ambulatory Visit: Payer: Self-pay

## 2016-11-30 LAB — HM DIABETES EYE EXAM

## 2016-11-30 MED ORDER — SITAGLIPTIN PHOS-METFORMIN HCL 50-1000 MG PO TABS: 1.0000 | ORAL_TABLET | Freq: Two times a day (BID) | ORAL | 3 refills | Status: DC

## 2016-12-04 ENCOUNTER — Ambulatory Visit (INDEPENDENT_AMBULATORY_CARE_PROVIDER_SITE_OTHER): Payer: Medicare Other | Admitting: General Practice

## 2016-12-04 DIAGNOSIS — Z5181 Encounter for therapeutic drug level monitoring: Secondary | ICD-10-CM

## 2016-12-04 DIAGNOSIS — I4891 Unspecified atrial fibrillation: Secondary | ICD-10-CM

## 2016-12-04 LAB — POCT INR: INR: 2.7

## 2016-12-04 NOTE — Patient Instructions (Signed)
Pre visit review using our clinic review tool, if applicable. No additional management support is needed unless otherwise documented below in the visit note. 

## 2016-12-04 NOTE — Progress Notes (Signed)
I have reviewed and agree with the plan. 

## 2016-12-10 ENCOUNTER — Encounter: Payer: Self-pay | Admitting: Endocrinology

## 2016-12-14 ENCOUNTER — Other Ambulatory Visit: Payer: Self-pay

## 2016-12-14 ENCOUNTER — Telehealth: Payer: Self-pay | Admitting: Endocrinology

## 2016-12-14 MED ORDER — GLIMEPIRIDE 2 MG PO TABS: ORAL_TABLET | ORAL | 3 refills | Status: DC

## 2016-12-14 NOTE — Telephone Encounter (Signed)
Routing to you °

## 2016-12-14 NOTE — Telephone Encounter (Signed)
MEDICATION: glimepiride (AMARYL) 2 MG tablet  PH[facility]ACY:  [facility] - [place], [place] - [address] [phone] (Phone) [phone] (Fax)    IS THIS A 90 DAY SUPPLY : yes  IS PATIENT OUT OF MEDICATION: yes  IF NOT; HOW MUCH IS LEFT: n/a  LAST APPOINTMENT DATE: 11/11/16  NEXT APPOINTMENT DATE:02/08/17  OTHER COMMENTS:    **Let patient know to contact pharmacy at the end of the day to make sure medication is ready. **  ** Please notify patient to allow 48-72 hours to process**  **Encourage patient to contact the pharmacy for refills or they can request refills through [facility]**

## 2016-12-14 NOTE — Telephone Encounter (Signed)
Called patient and not able to leave a voice message. Her medication refill has been sent to the [facility] on [address].

## 2016-12-24 ENCOUNTER — Other Ambulatory Visit: Payer: Self-pay

## 2016-12-24 ENCOUNTER — Telehealth: Payer: Self-pay | Admitting: Endocrinology

## 2016-12-24 MED ORDER — ATORVASTATIN CALCIUM 10 MG PO TABS: 10.0000 mg | ORAL_TABLET | Freq: Every day | ORAL | 3 refills | Status: DC

## 2016-12-24 MED ORDER — AMLODIPINE BESYLATE 5 MG PO TABS: 5.0000 mg | ORAL_TABLET | Freq: Every day | ORAL | 3 refills | Status: DC

## 2016-12-24 NOTE — Telephone Encounter (Signed)
MEDICATION: amLODipine (NORVASC) 5 MG tablet atorvastatin (LIPITOR) 10 MG tablet PHARMACY:    [facility] - [place],  - [address] [phone])     IS THIS A 90 DAY SUPPLY : yes  IS PATIENT OUT OF MEDICATION: yes  IF NOT; HOW MUCH IS LEFT: n/a  LAST APPOINTMENT DATE:11/11/16  NEXT APPOINTMENT DATE:02/11/17  OTHER COMMENTS:    **Let patient know to contact pharmacy at the end of the day to make sure medication is ready. **  ** Please notify patient to allow 48-72 hours to process**  **Encourage patient to contact the pharmacy for refills or they can [facility]**

## 2016-12-24 NOTE — Telephone Encounter (Signed)
Called patient and let her know that [facility] [address]

## 2016-12-29 ENCOUNTER — Other Ambulatory Visit: Payer: Self-pay

## 2016-12-29 ENCOUNTER — Telehealth: Payer: Self-pay | Admitting: Endocrinology

## 2016-12-29 MED ORDER — WARFARIN SODIUM 5 MG PO TABS: 5.0000 mg | ORAL_TABLET | Freq: Every day | ORAL | 5 refills | Status: DC

## 2016-12-29 NOTE — Telephone Encounter (Signed)
Called patient and let her know that I have sent in a refill for her Coumadin to the [facility].

## 2016-12-29 NOTE — Telephone Encounter (Signed)
MEDICATION: Coumadin 5 mg  PHARMACY:  [facility] - [place], [place] - [address]  IS THIS A 90 DAY SUPPLY : no  IS PATIENT OUT OF MEDICATION: no  IF NOT; HOW MUCH IS LEFT: 3 pills  LAST APPOINTMENT DATE: 07/25  NEXT APPOINTMENT DATE:@ 10/25  OTHER COMMENTS:    **Let patient know to contact pharmacy at the end of the day to make sure medication is ready. **  ** Please notify patient to allow 48-72 hours to process**  **Encourage patient to contact the pharmacy for refills or they can [facility]**

## 2017-01-01 ENCOUNTER — Ambulatory Visit: Payer: Medicare Other

## 2017-02-08 ENCOUNTER — Other Ambulatory Visit (INDEPENDENT_AMBULATORY_CARE_PROVIDER_SITE_OTHER): Payer: Medicare Other

## 2017-02-08 DIAGNOSIS — E1165 Type 2 diabetes mellitus with hyperglycemia: Secondary | ICD-10-CM

## 2017-02-08 LAB — COMPREHENSIVE METABOLIC PANEL
ALBUMIN: 3.9 g/dL (ref 3.5–5.2)
ALT: 9 U/L (ref 0–35)
AST: 14 U/L (ref 0–37)
Alkaline Phosphatase: 41 U/L (ref 39–117)
BUN: 16 mg/dL (ref 6–23)
CALCIUM: 9.6 mg/dL (ref 8.4–10.5)
CHLORIDE: 99 meq/L (ref 96–112)
CO2: 27 meq/L (ref 19–32)
CREATININE: 1.23 mg/dL — AB (ref 0.40–1.20)
GFR: 43.72 mL/min — ABNORMAL LOW (ref >60.00)
Glucose, Bld: 197 mg/dL — ABNORMAL HIGH (ref 70–99)
POTASSIUM: 4.4 meq/L (ref 3.5–5.1)
Sodium: 136 mEq/L (ref 135–145)
Total Bilirubin: 0.9 mg/dL (ref 0.2–1.2)
Total Protein: 7.5 g/dL (ref 6.0–8.3)

## 2017-02-08 LAB — HEMOGLOBIN A1C: Hgb A1c MFr Bld: 7.3 % — ABNORMAL HIGH (ref 4.6–6.5)

## 2017-02-11 ENCOUNTER — Encounter: Payer: Self-pay | Admitting: Endocrinology

## 2017-02-11 ENCOUNTER — Ambulatory Visit (INDEPENDENT_AMBULATORY_CARE_PROVIDER_SITE_OTHER): Payer: Medicare Other | Admitting: Endocrinology

## 2017-02-11 VITALS — BP 144/80 | HR 77 | Ht 67.0 in | Wt 216.0 lb

## 2017-02-11 DIAGNOSIS — E559 Vitamin D deficiency, unspecified: Secondary | ICD-10-CM | POA: Diagnosis not present

## 2017-02-11 DIAGNOSIS — I10 Essential (primary) hypertension: Secondary | ICD-10-CM | POA: Diagnosis not present

## 2017-02-11 DIAGNOSIS — E1165 Type 2 diabetes mellitus with hyperglycemia: Secondary | ICD-10-CM | POA: Diagnosis not present

## 2017-02-11 DIAGNOSIS — Z23 Encounter for immunization: Secondary | ICD-10-CM | POA: Diagnosis not present

## 2017-02-11 NOTE — Progress Notes (Signed)
Susan Potter 83 y.o.            Reason for Appointment:   followup of various issues   History of Present Illness     HYPERTENSION:   She is currently on 10 mg Ziac and 5 mg amlodipine She did start noticing some swelling of her legs after starting 5 mg dose of amlodipine However she does not think this is as prominent now  Blood pressure is well controlled    BP Readings from Last 3 Encounters:  02/11/17 (!) 144/80  11/11/16 124/70  09/30/16 118/76     Diagnosis: Type 2 DIABETES MELITUS, date of diagnosis: 2002   She is being seen for follow up of Type 2 diabetes.  This has been  mild   Current oral hypoglycemic regimen:.  Amaryl 2 mg, 1.5 tablets at dinner, Janumet 50/1000, 1 tablet in a.m.  Her A1c Has been trending higher more recently and is now 7.3 compared to 7.4 previously  She did not bring her monitor for download  again She probably has not checked her sugar lately  She thinks she is taking Janumet and Amaryl as directed regularly However fasting blood sugar in the lab was 181, higher than before Her weight fluctuates based on her fluid status No hypoglycemia symptoms during the day  Side effects from medications: None  Glucometer: One Touch.  Blood Glucose readings: none   Food preferences: Avoiding sweets, cereal and large portions, getting an egg in am for breakfast sometimes, eating more fruit. Physical activity: a little walking at home, Does not like to go out and may tend to have some back pain  Diabetes complications: None  Wt Readings from Last 3 Encounters:  02/11/17 216 lb (98 kg)  11/11/16 224 lb (101.6 kg)  09/30/16 220 lb (99.8 kg)    Lab Results  Component Value Date   HGBA1C 7.3 (H) 02/08/2017   HGBA1C 7.4 (H) 09/25/2016   HGBA1C 6.5 06/18/2016   Lab Results  Component Value Date   MICROALBUR 1.2 09/25/2016   LDLCALC 56 06/18/2016   CREATININE 1.23 (H) 02/08/2017      BP Readings from Last 3 Encounters:  02/11/17  (!) 144/80  11/11/16 124/70  09/30/16 118/76    HYPERLIPIDEMIA: Has good control, she has been taking Lipitor   Lab Results  Component Value Date   CHOL 127 06/18/2016   HDL 50.60 06/18/2016   LDLCALC 56 06/18/2016   LDLDIRECT 84.6 01/08/2014   TRIG 100.0 06/18/2016   CHOLHDL 3 06/18/2016    Other active issues: See review of systems     Allergies as of 02/11/2017   No Known Allergies     Medication List       Accurate as of 02/11/17  8:53 AM. Always use your most recent med list.          amLODipine 5 MG tablet Commonly known as:  NORVASC Take 1 tablet (5 mg total) by mouth daily.   atorvastatin 10 MG tablet Commonly known as:  LIPITOR Take 1 tablet (10 mg total) by mouth daily.   bisoprolol-hydrochlorothiazide 10-6.25 MG tablet Commonly known as:  ZIAC Take 1 tablet by mouth daily.   glimepiride 2 MG tablet Commonly known as:  AMARYL take 1.5 tablets by mouth once daily AT DINNER TIME   glucose blood test strip One touch Ultra 2 test strips only   ONE TOUCH ULTRA SYSTEM KIT w/Device Kit One touch Ultra 2 Glucometer   OVER THE COUNTER MEDICATION  Place 1 drop into both eyes daily as needed (dry eyes/itching).   raloxifene 60 MG tablet Commonly known as:  EVISTA Take 60 mg by mouth daily.   sitaGLIPtin-metformin 50-1000 MG tablet Commonly known as:  JANUMET Take 1 tablet by mouth 2 (two) times daily with a meal.   Vitamin D (Ergocalciferol) 50000 units Caps capsule Commonly known as:  DRISDOL take 1 capsule by mouth every week ON SUNDAYS   warfarin 5 MG tablet Commonly known as:  COUMADIN Take 1 tablet (5 mg total) by mouth daily.       Allergies: No Known Allergies  Past Medical History:  Diagnosis Date  . Atrial fibrillation ([place])   . Diabetes mellitus without complication ([place])   . Hypertension   . Osteoporosis   . Retinal vein occlusion     No past surgical history on file.  Family History  Problem Relation Age of Onset  .  Heart disease Brother     Social History:  reports that she has never smoked. She has never used smokeless tobacco. She reports that she does not drink alcohol. Her drug history is not on file.  Review of Systems -   Osteoporosis: On Evista Long-term  She is taking her vitamin D and her last level was normal  Renal insufficiency: This has been fluctuating  last creatinine:    Lab Results  Component Value Date   CREATININE 1.23 (H) 02/08/2017        She is overdue for getting her pro time checked and will go this week     Examination:   BP (!) 144/80   Pulse 77   Ht _0  (1.702 m)   Wt 216 lb (98 kg)   SpO2 95%   BMI 33.83 kg/m   Body mass index is 33.83 kg/m.    minimal edema around the ankles   Assesment/Plan:   1. Diabetes type 2, with  obesity   Her lab glucose was  181 and her A1c is relatively higher at 7.3 Most likely she is having some progression of her diabetes as she is doing about the same with her lifestyle and has not gained any weight  For now will increase her Janumet and given an extra tablet at dinnertime She will continue her increased dose of Amaryl 1-1/2 tablets in the evening Encourage her to check regularly  at home  2.  Renal dysfunction: This is now Stable Will follow regularly  3.  HYPERTENSION: Blood pressure is consistently controlled with adding  5 mg amlodipine Also edema is not as much now  Return in 12 weeks   Influenza vaccine given    [facility] 02/11/2017, 8:53 AM

## 2017-02-11 NOTE — Patient Instructions (Signed)
Take extra 1/2 janumet at dinner daily also

## 2017-02-26 ENCOUNTER — Telehealth: Payer: Self-pay | Admitting: Endocrinology

## 2017-02-26 ENCOUNTER — Other Ambulatory Visit: Payer: Self-pay

## 2017-02-26 MED ORDER — BISOPROLOL-HYDROCHLOROTHIAZIDE 10-6.25 MG PO TABS: 1.0000 | ORAL_TABLET | Freq: Every day | ORAL | 3 refills | Status: DC

## 2017-02-26 NOTE — Telephone Encounter (Signed)
Please send prescription for Bisoprolol-HCTZ (generic for Xanax) to [facility] [address]. Pharmacy told patient they have been requesting refill from Dr. for over a week w/no response. Patient is out of medication.

## 2017-02-26 NOTE — Telephone Encounter (Signed)
Sent in bisoprolol

## 2017-03-02 ENCOUNTER — Ambulatory Visit (INDEPENDENT_AMBULATORY_CARE_PROVIDER_SITE_OTHER): Payer: Medicare Other | Admitting: General Practice

## 2017-03-02 DIAGNOSIS — Z7901 Long term (current) use of anticoagulants: Secondary | ICD-10-CM | POA: Diagnosis not present

## 2017-03-02 LAB — POCT INR: INR: 3.1

## 2017-03-02 NOTE — Patient Instructions (Addendum)
Pre visit review using our clinic review tool, if applicable. No additional management support is needed unless otherwise documented below in the visit note.  Skip today (1/13) and then continue to take 1 tablet daily except 1 1/2 tablets every Friday.  Re-check in 4 weeks. Go to lab in the basement for pt/inr.

## 2017-04-02 ENCOUNTER — Ambulatory Visit: Payer: Medicare Other

## 2017-04-06 ENCOUNTER — Ambulatory Visit (INDEPENDENT_AMBULATORY_CARE_PROVIDER_SITE_OTHER): Payer: Medicare Other | Admitting: General Practice

## 2017-04-06 DIAGNOSIS — Z7901 Long term (current) use of anticoagulants: Secondary | ICD-10-CM | POA: Diagnosis not present

## 2017-04-06 DIAGNOSIS — I4891 Unspecified atrial fibrillation: Secondary | ICD-10-CM | POA: Diagnosis not present

## 2017-04-06 LAB — POCT INR: INR: 3.6

## 2017-04-06 NOTE — Patient Instructions (Addendum)
Pre visit review using our clinic review tool, if applicable. No additional management support is needed unless otherwise documented below in the visit note.  Skip today (12/18) and then change dosage and take 1 tablet daily.  Re-check in 4 weeks.

## 2017-04-16 ENCOUNTER — Encounter: Payer: Self-pay | Admitting: Endocrinology

## 2017-04-16 ENCOUNTER — Ambulatory Visit (INDEPENDENT_AMBULATORY_CARE_PROVIDER_SITE_OTHER): Payer: Medicare Other | Admitting: Endocrinology

## 2017-04-16 VITALS — BP 118/76 | HR 78 | Ht 67.0 in | Wt 218.2 lb

## 2017-04-16 DIAGNOSIS — E1165 Type 2 diabetes mellitus with hyperglycemia: Secondary | ICD-10-CM | POA: Diagnosis not present

## 2017-04-16 LAB — GLUCOSE, POCT (MANUAL RESULT ENTRY): POC Glucose: 151 mg/dl — AB (ref 70–99)

## 2017-04-16 MED ORDER — VITAMIN D (ERGOCALCIFEROL) 1.25 MG (50000 UNIT) PO CAPS: ORAL_CAPSULE | ORAL | 5 refills | Status: DC

## 2017-04-16 NOTE — Progress Notes (Signed)
Susan Potter 83 y.o.            Reason for Appointment:   followup of various issues   History of Present Illness     HYPERTENSION:   She is now well controlled on 10 mg Ziac and 5 mg amlodipine Initially had some swelling with amlodipine but not subsequently Does not monitor at home   BP Readings from Last 3 Encounters:  04/16/17 118/76  02/11/17 (!) 144/80  11/11/16 124/70     Type 2 DIABETES MELITUS, date of diagnosis: 2002   She is being seen for follow up of Type 2 diabetes, treated with oral hypoglycemic drugs.   Current oral hypoglycemic regimen:.  Amaryl 2 mg, 1.5 tablets at dinner, Janumet 50/1000, 1 tablet in a.m.  On her visit in October A1c was still relatively high at 7.3 compared to 7.4 previously  She did not bring her monitor for download and does not think she has checked her sugar lately On her last visit because of relatively high readings including a high fasting of 181 she was told to take 2 tablets of Janumet a day instead of 1 Glucose in the office was better today at 151 fasting No hypoglycemia symptoms during the day As before her weight tends to fluctuate  Side effects from medications: None  Glucometer: One Touch.  Blood Glucose readings: none   Food preferences: Avoiding sweets, cereal and large portions, getting an egg in am for breakfast sometimes, eating more fruit.  Physical activity: a little walking at home, Does not like to go out and may tend to have some back pain  Diabetes complications: None  Wt Readings from Last 3 Encounters:  04/16/17 218 lb 3.2 oz (99 kg)  02/11/17 216 lb (98 kg)  11/11/16 224 lb (101.6 kg)    Lab Results  Component Value Date   HGBA1C 7.3 (H) 02/08/2017   HGBA1C 7.4 (H) 09/25/2016   HGBA1C 6.5 06/18/2016   Lab Results  Component Value Date   MICROALBUR 1.2 09/25/2016   LDLCALC 56 06/18/2016   CREATININE 1.23 (H) 02/08/2017      HYPERLIPIDEMIA: Has good control, she has been taking  Lipitor   Lab Results  Component Value Date   CHOL 127 06/18/2016   HDL 50.60 06/18/2016   LDLCALC 56 06/18/2016   LDLDIRECT 84.6 01/08/2014   TRIG 100.0 06/18/2016   CHOLHDL 3 06/18/2016    Other active issues: See review of systems     Allergies as of 04/16/2017   No Known Allergies     Medication List        Accurate as of 04/16/17  8:51 AM. Always use your most recent med list.          amLODipine 5 MG tablet Commonly known as:  NORVASC Take 1 tablet (5 mg total) by mouth daily.   atorvastatin 10 MG tablet Commonly known as:  LIPITOR Take 1 tablet (10 mg total) by mouth daily.   bisoprolol-hydrochlorothiazide 10-6.25 MG tablet Commonly known as:  ZIAC Take 1 tablet daily by mouth.   glimepiride 2 MG tablet Commonly known as:  AMARYL take 1.5 tablets by mouth once daily AT DINNER TIME   glucose blood test strip One touch Ultra 2 test strips only   ONE TOUCH ULTRA SYSTEM KIT w/Device Kit One touch Ultra 2 Glucometer   OVER THE COUNTER MEDICATION Place 1 drop into both eyes daily as needed (dry eyes/itching).   raloxifene 60 MG tablet Commonly known as:  EVISTA Take 60 mg by mouth daily.   sitaGLIPtin-metformin 50-1000 MG tablet Commonly known as:  JANUMET Take 1 tablet by mouth 2 (two) times daily with a meal.   Vitamin D (Ergocalciferol) 50000 units Caps capsule Commonly known as:  DRISDOL take 1 capsule by mouth every week ON SUNDAYS   warfarin 5 MG tablet Commonly known as:  COUMADIN Take as directed by the anticoagulation clinic. If you are unsure how to take this medication, talk to your nurse or doctor. Original instructions:  Take 1 tablet (5 mg total) by mouth daily.       Allergies: No Known Allergies  Past Medical History:  Diagnosis Date  . Atrial fibrillation ([place])   . Diabetes mellitus without complication ([place])   . Hypertension   . Osteoporosis   . Retinal vein occlusion     No past surgical history on file.  Family  History  Problem Relation Age of Onset  . Heart disease Brother     Social History:  reports that  has never smoked. she has never used smokeless tobacco. She reports that she does not drink alcohol. Her drug history is not on file.  Review of Systems -   Osteoporosis: On Evista Long-term  She is taking her vitamin D and her last level was normal  Renal insufficiency: This has been fluctuating  last creatinine:    Lab Results  Component Value Date   CREATININE 1.23 (H) 02/08/2017         Examination:   BP 118/76   Pulse 78   Ht _0  (1.702 m)   Wt 218 lb 3.2 oz (99 kg)   SpO2 97%   BMI 34.17 kg/m   Body mass index is 34.17 kg/m.   No significant ankle edema present   Assesment/Plan:   1. Diabetes type 2, with  obesity   See history of present illness for discussion of current diabetes management, blood sugar patterns and problems identified  Her blood sugars are appearing better with increasing her Janumet to twice a day Glucose 151 fasting today which is reasonably good for her She does not like to check blood sugars at home She has had limited activity level but generally tries to watch her diet  2.  Renal dysfunction: This needs to be rechecked, has been mild usually She will get her labs drawn when she has a INR check next month  3.  HYPERTENSION: Blood pressure is consistently controlled  No edema with the amlodipine now  4.  Vitamin D deficiency: Continue same regimen, we'll have her labs done in about 2 weeks for follow-up level  Return in 12 weeks      Elayne Snare 04/16/2017, 8:51 AM

## 2017-04-27 ENCOUNTER — Telehealth: Payer: Self-pay | Admitting: Endocrinology

## 2017-04-27 NOTE — Telephone Encounter (Signed)
Pt needs refills for the following      glimepiride (AMARYL) 2 MG tablet  atorvastatin (LIPITOR) 10 MG tablet  amLODipine (NORVASC) 5 MG tablet    [facility] - [place],  - [address]

## 2017-04-28 ENCOUNTER — Other Ambulatory Visit: Payer: Self-pay

## 2017-04-28 MED ORDER — AMLODIPINE BESYLATE 5 MG PO TABS: 5.0000 mg | ORAL_TABLET | Freq: Every day | ORAL | 3 refills | Status: DC

## 2017-04-28 MED ORDER — ATORVASTATIN CALCIUM 10 MG PO TABS: 10.0000 mg | ORAL_TABLET | Freq: Every day | ORAL | 3 refills | Status: DC

## 2017-04-28 MED ORDER — GLIMEPIRIDE 2 MG PO TABS: ORAL_TABLET | ORAL | 3 refills | Status: DC

## 2017-04-28 NOTE — Telephone Encounter (Signed)
This has been done.

## 2017-05-04 ENCOUNTER — Ambulatory Visit (INDEPENDENT_AMBULATORY_CARE_PROVIDER_SITE_OTHER): Payer: Medicare Other | Admitting: General Practice

## 2017-05-04 DIAGNOSIS — Z7901 Long term (current) use of anticoagulants: Secondary | ICD-10-CM

## 2017-05-04 LAB — POCT INR: INR: 2.5

## 2017-05-04 NOTE — Patient Instructions (Addendum)
Pre visit review using our clinic review tool, if applicable. No additional management support is needed unless otherwise documented below in the visit note.  Continue to take 1 tablet daily.  Re-check in 4 weeks.  

## 2017-06-01 ENCOUNTER — Ambulatory Visit (INDEPENDENT_AMBULATORY_CARE_PROVIDER_SITE_OTHER): Payer: Medicare Other | Admitting: General Practice

## 2017-06-01 DIAGNOSIS — Z7901 Long term (current) use of anticoagulants: Secondary | ICD-10-CM | POA: Diagnosis not present

## 2017-06-01 DIAGNOSIS — I4891 Unspecified atrial fibrillation: Secondary | ICD-10-CM

## 2017-06-01 LAB — POCT INR: INR: 1.5

## 2017-06-01 NOTE — Patient Instructions (Addendum)
Pre visit review using our clinic review tool, if applicable. No additional management support is needed unless otherwise documented below in the visit note.  Take 1 1/2 tablets today and tomorrow (2/12 and 2/13) and then continue to take 1 tablet daily.  Re-check in 4 weeks.

## 2017-06-28 ENCOUNTER — Telehealth: Payer: Self-pay | Admitting: Endocrinology

## 2017-06-28 ENCOUNTER — Other Ambulatory Visit: Payer: Self-pay

## 2017-06-28 MED ORDER — SITAGLIPTIN PHOS-METFORMIN HCL 50-1000 MG PO TABS: 1.0000 | ORAL_TABLET | Freq: Two times a day (BID) | ORAL | 3 refills | Status: DC

## 2017-06-28 MED ORDER — BISOPROLOL-HYDROCHLOROTHIAZIDE 10-6.25 MG PO TABS: 1.0000 | ORAL_TABLET | Freq: Every day | ORAL | 3 refills | Status: DC

## 2017-06-28 MED ORDER — WARFARIN SODIUM 5 MG PO TABS: 5.0000 mg | ORAL_TABLET | Freq: Every day | ORAL | 5 refills | Status: DC

## 2017-06-28 NOTE — Telephone Encounter (Signed)
Patient needs RX for 3 medications (Pharmacy told her they need Dr. Francina AmesApproval-it takes 3 days-so she is requesting herself) 1. Coumadin 5 mg; 2. Bisoprolol HCTZ; and 3. Jan[facility]et [facility] to [facility] on [address]

## 2017-06-28 NOTE — Telephone Encounter (Signed)
This has been done.

## 2017-06-29 ENCOUNTER — Ambulatory Visit (INDEPENDENT_AMBULATORY_CARE_PROVIDER_SITE_OTHER): Payer: Medicare Other | Admitting: General Practice

## 2017-06-29 DIAGNOSIS — I4891 Unspecified atrial fibrillation: Secondary | ICD-10-CM

## 2017-06-29 DIAGNOSIS — Z7901 Long term (current) use of anticoagulants: Secondary | ICD-10-CM

## 2017-06-29 LAB — POCT INR: INR: 1.5

## 2017-06-29 NOTE — Patient Instructions (Addendum)
Pre visit review using our clinic review tool, if applicable. No additional management support is needed unless otherwise documented below in the visit note.  Take 1 1/2 tablets today and tomorrow (3/12 and 3/13) and then change dosage and take 1 tablet daily except 1 1/2 tablets on Mondays and Fridays.  Re-check in 2 weeks.

## 2017-07-13 ENCOUNTER — Ambulatory Visit (INDEPENDENT_AMBULATORY_CARE_PROVIDER_SITE_OTHER): Payer: Medicare Other | Admitting: General Practice

## 2017-07-13 DIAGNOSIS — I4891 Unspecified atrial fibrillation: Secondary | ICD-10-CM

## 2017-07-13 DIAGNOSIS — Z7901 Long term (current) use of anticoagulants: Secondary | ICD-10-CM | POA: Diagnosis not present

## 2017-07-13 LAB — POCT INR: INR: 2.6

## 2017-07-13 NOTE — Patient Instructions (Addendum)
Pre visit review using our clinic review tool, if applicable. No additional management support is needed unless otherwise documented below in the visit note.  Take 1 tablet daily except 1 1/2 on Mondays and Fridays.  Re-check in 4 weeks.

## 2017-07-14 NOTE — Progress Notes (Signed)
Susan Potter 84 y.o.            Reason for Appointment:   followup of various issues   History of Present Illness     HYPERTENSION:   Blood pressure is consistently controlled on 10 mg Ziac and 5 mg amlodipine  No recent swelling of her legs  Does not monitor at home   BP Readings from Last 3 Encounters:  07/15/17 108/80  04/16/17 118/76  02/11/17 (!) 144/80     Type 2 DIABETES MELITUS, date of diagnosis: 2002   She is being seen for follow up of Type 2 diabetes, treated with oral hypoglycemic drugs.   Current oral hypoglycemic regimen:.  Amaryl 2 mg, 1.5 tablets at dinner, Janumet 50/1000, 1 tablet twice a day  Her A1c appears to be running about the same between 7.3-7.5  Current management and problems:  She is not checking her blood sugars despite reminders  She is not able to do much physical activity because of back pain  Although she thinks she is trying to watch her diet and avoiding large portions she has not lost any weight  She thinks she is taking all her medications as directed including Amaryl at dinnertime  No hypoglycemia with this   Side effects from medications: None  Glucometer: One Touch.  Blood Glucose readings: none  Compliance with diet: Avoiding sweets, cereal and large portions, getting an egg in am for breakfast sometimes, eating more fruit.  Physical activity: a little walking at home, Does not like to go out because of back pain  Diabetes complications: None  Wt Readings from Last 3 Encounters:  07/15/17 218 lb (98.9 kg)  04/16/17 218 lb 3.2 oz (99 kg)  02/11/17 216 lb (98 kg)    Lab Results  Component Value Date   HGBA1C 7.5 07/15/2017   HGBA1C 7.3 (H) 02/08/2017   HGBA1C 7.4 (H) 09/25/2016   Lab Results  Component Value Date   MICROALBUR 1.2 09/25/2016   LDLCALC 56 06/18/2016   CREATININE 1.23 (H) 02/08/2017      HYPERLIPIDEMIA: Has had good control, she has been taking Lipitor regularly   Lab Results    Component Value Date   CHOL 127 06/18/2016   HDL 50.60 06/18/2016   LDLCALC 56 06/18/2016   LDLDIRECT 84.6 01/08/2014   TRIG 100.0 06/18/2016   CHOLHDL 3 06/18/2016    Other active issues: See review of systems     Allergies as of 07/15/2017   No Known Allergies     Medication List        Accurate as of 07/15/17  8:30 AM. Always use your most recent med list.          amLODipine 5 MG tablet Commonly known as:  NORVASC Take 1 tablet (5 mg total) by mouth daily.   atorvastatin 10 MG tablet Commonly known as:  LIPITOR Take 1 tablet (10 mg total) by mouth daily.   bisoprolol-hydrochlorothiazide 10-6.25 MG tablet Commonly known as:  ZIAC Take 1 tablet by mouth daily.   glimepiride 2 MG tablet Commonly known as:  AMARYL take 1.5 tablets by mouth once daily AT DINNER TIME   glucose blood test strip One touch Ultra 2 test strips only   ONE TOUCH ULTRA SYSTEM KIT w/Device Kit One touch Ultra 2 Glucometer   OVER THE COUNTER MEDICATION Place 1 drop into both eyes daily as needed (dry eyes/itching).   raloxifene 60 MG tablet Commonly known as:  EVISTA Take 60 mg by  mouth daily.   sitaGLIPtin-metformin 50-1000 MG tablet Commonly known as:  JANUMET Take 1 tablet by mouth 2 (two) times daily with a meal.   Vitamin D (Ergocalciferol) 50000 units Caps capsule Commonly known as:  DRISDOL take 1 capsule by mouth every week ON SUNDAYS   warfarin 5 MG tablet Commonly known as:  COUMADIN Take as directed by the anticoagulation clinic. If you are unsure how to take this medication, talk to your nurse or doctor. Original instructions:  Take 1 tablet (5 mg total) by mouth daily.       Allergies: No Known Allergies  Past Medical History:  Diagnosis Date  . Atrial fibrillation ([place])   . Diabetes mellitus without complication ([place])   . Hypertension   . Osteoporosis   . Retinal vein occlusion     History reviewed. No pertinent surgical history.  Family History   Problem Relation Age of Onset  . Heart disease Brother     Social History:  reports that she has never smoked. She has never used smokeless tobacco. She reports that she does not drink alcohol. Her drug history is not on file.  Review of Systems -   Osteoporosis: On Evista Long-term and continues to take this  She is taking her vitamin D 50,000 units weekly and her last level was normal  Renal insufficiency: This has been fluctuating,  last creatinine improved:    Lab Results  Component Value Date   CREATININE 1.23 (H) 02/08/2017    Atrial fibrillation: No palpitations recently    Examination:   BP 108/80 (BP Location: Left Arm, Patient Position: Sitting, Cuff Size: Large)   Pulse 68   Ht _0  (1.702 m)   Wt 218 lb (98.9 kg)   SpO2 93%   BMI 34.14 kg/m   Body mass index is 34.14 kg/m.   No ankle edema present   Assesment/Plan:   1. Diabetes type 2, with  obesity   See history of present illness for discussion of current diabetes management, blood sugar patterns and problems identified  Her A1c is 7.5 and is about the same the last 2 times also She does not check her blood sugars Blood sugar in the office is 141 today fasting For now we will continue her regimen unchanged but encouraged her to check her sugars in between her visits  Also advised her to try walking at various stores with a shopping cart for exercise  2.  Renal dysfunction: This needs to be rechecked today    3.  HYPERTENSION: Blood pressure is consistently controlled  She will continue the same regimen  History of osteoporosis: She continues to be on Evista long-term  5.  Lipids: To be checked today   Elayne Snare 07/15/2017, 8:30 AM

## 2017-07-15 ENCOUNTER — Encounter: Payer: Self-pay | Admitting: Endocrinology

## 2017-07-15 ENCOUNTER — Ambulatory Visit (INDEPENDENT_AMBULATORY_CARE_PROVIDER_SITE_OTHER): Payer: Medicare Other | Admitting: Endocrinology

## 2017-07-15 VITALS — BP 108/80 | HR 68 | Ht 67.0 in | Wt 218.0 lb

## 2017-07-15 DIAGNOSIS — I4891 Unspecified atrial fibrillation: Secondary | ICD-10-CM

## 2017-07-15 DIAGNOSIS — E1165 Type 2 diabetes mellitus with hyperglycemia: Secondary | ICD-10-CM

## 2017-07-15 DIAGNOSIS — E559 Vitamin D deficiency, unspecified: Secondary | ICD-10-CM

## 2017-07-15 DIAGNOSIS — I10 Essential (primary) hypertension: Secondary | ICD-10-CM

## 2017-07-15 LAB — LIPID PANEL
CHOL/HDL RATIO: 2
Cholesterol: 117 mg/dL (ref 0–200)
HDL: 47.3 mg/dL (ref >39.00)
LDL Cholesterol: 52 mg/dL (ref 0–99)
NONHDL: 69.87
Triglycerides: 88 mg/dL (ref 0.0–149.0)
VLDL: 17.6 mg/dL (ref 0.0–40.0)

## 2017-07-15 LAB — COMPREHENSIVE METABOLIC PANEL
ALT: 8 U/L (ref 0–35)
AST: 13 U/L (ref 0–37)
Albumin: 4.2 g/dL (ref 3.5–5.2)
Alkaline Phosphatase: 43 U/L (ref 39–117)
BUN: 15 mg/dL (ref 6–23)
CO2: 28 meq/L (ref 19–32)
CREATININE: 1.2 mg/dL (ref 0.40–1.20)
Calcium: 10 mg/dL (ref 8.4–10.5)
Chloride: 102 mEq/L (ref 96–112)
GFR: 44.94 mL/min — ABNORMAL LOW (ref >60.00)
GLUCOSE: 158 mg/dL — AB (ref 70–99)
Potassium: 4.1 mEq/L (ref 3.5–5.1)
SODIUM: 140 meq/L (ref 135–145)
Total Bilirubin: 0.9 mg/dL (ref 0.2–1.2)
Total Protein: 8 g/dL (ref 6.0–8.3)

## 2017-07-15 LAB — GLUCOSE, POCT (MANUAL RESULT ENTRY): POC Glucose: 141 mg/dl — AB (ref 70–99)

## 2017-07-15 LAB — POCT GLYCOSYLATED HEMOGLOBIN (HGB A1C): HEMOGLOBIN A1C: 7.5

## 2017-07-26 ENCOUNTER — Other Ambulatory Visit: Payer: Self-pay

## 2017-07-26 ENCOUNTER — Telehealth: Payer: Self-pay | Admitting: Endocrinology

## 2017-07-26 DIAGNOSIS — I482 Chronic atrial fibrillation, unspecified: Secondary | ICD-10-CM

## 2017-07-26 MED ORDER — WARFARIN SODIUM 5 MG PO TABS: 5.0000 mg | ORAL_TABLET | Freq: Every day | ORAL | 5 refills | Status: DC

## 2017-07-26 NOTE — Telephone Encounter (Signed)
Need of warfarin (COUMADIN) 5 MG tablet [[id number]][[id number]] she out because they changed her dosage Therefore She need a new prescription.    [id number] [address], [place] - [phone] [facility] [address] DEA #:  [id number]

## 2017-07-29 ENCOUNTER — Other Ambulatory Visit: Payer: Self-pay

## 2017-07-29 DIAGNOSIS — I482 Chronic atrial fibrillation, unspecified: Secondary | ICD-10-CM

## 2017-07-29 MED ORDER — WARFARIN SODIUM 5 MG PO TABS: 5.0000 mg | ORAL_TABLET | Freq: Every day | ORAL | 5 refills | Status: DC

## 2017-08-03 ENCOUNTER — Other Ambulatory Visit: Payer: Self-pay

## 2017-08-03 ENCOUNTER — Telehealth: Payer: Self-pay | Admitting: Endocrinology

## 2017-08-03 MED ORDER — GLIMEPIRIDE 2 MG PO TABS: ORAL_TABLET | ORAL | 3 refills | Status: DC

## 2017-08-03 MED ORDER — AMLODIPINE BESYLATE 5 MG PO TABS: 5.0000 mg | ORAL_TABLET | Freq: Every day | ORAL | 3 refills | Status: DC

## 2017-08-03 MED ORDER — ATORVASTATIN CALCIUM 10 MG PO TABS: 10.0000 mg | ORAL_TABLET | Freq: Every day | ORAL | 3 refills | Status: DC

## 2017-08-03 NOTE — Telephone Encounter (Signed)
Need refill for  atorvastatin (LIPITOR) 10 MG tablet [[id number]][[id number]]  amLODipine (NORVASC) 5 MG tablet [[id number]][[id number]]  glimepiride (AMARYL) 2 MG tablet [[id number]][[id number]]    [id number] [place]  - [phone] AT [facility] [address] [facility] [address] [phone] (Fax)

## 2017-08-03 NOTE — Telephone Encounter (Signed)
Medications were sent to pharmacy.

## 2017-08-10 ENCOUNTER — Ambulatory Visit (INDEPENDENT_AMBULATORY_CARE_PROVIDER_SITE_OTHER): Payer: Medicare Other | Admitting: General Practice

## 2017-08-10 DIAGNOSIS — Z7901 Long term (current) use of anticoagulants: Secondary | ICD-10-CM | POA: Diagnosis not present

## 2017-08-10 DIAGNOSIS — I4891 Unspecified atrial fibrillation: Secondary | ICD-10-CM

## 2017-08-10 LAB — POCT INR: INR: 2.5

## 2017-08-10 NOTE — Patient Instructions (Addendum)
Pre visit review using our clinic review tool, if applicable. No additional management support is needed unless otherwise documented below in the visit note.  Take 1 tablet daily except 1 1/2 on Mondays and Fridays.  Re-check in 4 weeks.  

## 2017-08-24 ENCOUNTER — Telehealth: Payer: Self-pay | Admitting: General Practice

## 2017-08-24 ENCOUNTER — Other Ambulatory Visit: Payer: Self-pay | Admitting: General Practice

## 2017-08-24 DIAGNOSIS — I482 Chronic atrial fibrillation, unspecified: Secondary | ICD-10-CM

## 2017-08-24 MED ORDER — WARFARIN SODIUM 5 MG PO TABS: ORAL_TABLET | ORAL | 3 refills | Status: DC

## 2017-08-24 NOTE — Telephone Encounter (Signed)
Copied from CRM [id number]. Topic: Quick Communication - See Telephone Encounter >> Aug 24, 2017  8:46 AM Arlyss Gandy, NT wrote: CRM for notification. See Telephone encounter for: 08/24/17. Pt calling and states that a month ago Susan Potter changed her coumadin to 1 and 1/2 pills on Mondays and Fridays and now she is out medication and is needing a refill. [facility] will not fill the medication for her. [facility] [phone] - [place], [place] - [phone] AT [facility] [address] & [address] [phone] (Fax)  Patient is wanting the rx changed so that she can get the extra pills because her rx is not due for a refill until 08/27/17 but she is out due to the new way of taking the medication.

## 2017-08-25 NOTE — Telephone Encounter (Signed)
Noted.  Re-fill sent to [facility].

## 2017-08-31 ENCOUNTER — Ambulatory Visit (INDEPENDENT_AMBULATORY_CARE_PROVIDER_SITE_OTHER): Payer: Medicare Other | Admitting: General Practice

## 2017-08-31 DIAGNOSIS — Z7901 Long term (current) use of anticoagulants: Secondary | ICD-10-CM

## 2017-08-31 DIAGNOSIS — I4891 Unspecified atrial fibrillation: Secondary | ICD-10-CM | POA: Diagnosis not present

## 2017-08-31 LAB — POCT INR: INR: 1.7

## 2017-08-31 NOTE — Patient Instructions (Addendum)
Pre visit review using our clinic review tool, if applicable. No additional management support is needed unless otherwise documented below in the visit note.  Take extra 1/2 today (5/14) and then continue to take 1 tablet daily except 1 1/2 on Mondays and Fridays.  Re-check in 4 weeks.

## 2017-09-07 ENCOUNTER — Ambulatory Visit: Payer: Medicare Other

## 2017-09-28 ENCOUNTER — Ambulatory Visit (INDEPENDENT_AMBULATORY_CARE_PROVIDER_SITE_OTHER): Payer: Medicare Other | Admitting: General Practice

## 2017-09-28 DIAGNOSIS — I4891 Unspecified atrial fibrillation: Secondary | ICD-10-CM

## 2017-09-28 DIAGNOSIS — Z7901 Long term (current) use of anticoagulants: Secondary | ICD-10-CM | POA: Diagnosis not present

## 2017-09-28 LAB — POCT INR: INR: 4.3 — AB (ref 2.0–3.0)

## 2017-09-28 NOTE — Patient Instructions (Addendum)
Pre visit review using our clinic review tool, if applicable. No additional management support is needed unless otherwise documented below in the visit note.  Skip coumadin today and tomorrow (6/11 and 6/12) and then change dosage and take 1 tablet daily except 1 1/2 on Fridays.  Re-check in 4 weeks.

## 2017-10-12 ENCOUNTER — Other Ambulatory Visit (INDEPENDENT_AMBULATORY_CARE_PROVIDER_SITE_OTHER): Payer: Medicare Other

## 2017-10-12 DIAGNOSIS — I4891 Unspecified atrial fibrillation: Secondary | ICD-10-CM | POA: Diagnosis not present

## 2017-10-12 DIAGNOSIS — E559 Vitamin D deficiency, unspecified: Secondary | ICD-10-CM

## 2017-10-12 DIAGNOSIS — E1165 Type 2 diabetes mellitus with hyperglycemia: Secondary | ICD-10-CM | POA: Diagnosis not present

## 2017-10-12 LAB — BASIC METABOLIC PANEL
BUN: 15 mg/dL (ref 6–23)
CALCIUM: 9.4 mg/dL (ref 8.4–10.5)
CO2: 27 mEq/L (ref 19–32)
CREATININE: 1.24 mg/dL — AB (ref 0.40–1.20)
Chloride: 101 mEq/L (ref 96–112)
GFR: 43.25 mL/min — AB (ref >60.00)
GLUCOSE: 192 mg/dL — AB (ref 70–99)
Potassium: 4.2 mEq/L (ref 3.5–5.1)
SODIUM: 138 meq/L (ref 135–145)

## 2017-10-12 LAB — MICROALBUMIN / CREATININE URINE RATIO
CREATININE, U: 37.3 mg/dL
MICROALB UR: 1.1 mg/dL (ref 0.0–1.9)
MICROALB/CREAT RATIO: 3 mg/g (ref 0.0–30.0)

## 2017-10-12 LAB — VITAMIN D 25 HYDROXY (VIT D DEFICIENCY, FRACTURES): VITD: 52.23 ng/mL (ref 30.00–100.00)

## 2017-10-12 LAB — HEMOGLOBIN A1C: Hgb A1c MFr Bld: 7.7 % — ABNORMAL HIGH (ref 4.6–6.5)

## 2017-10-12 LAB — TSH: TSH: 2.84 u[IU]/mL (ref 0.35–4.50)

## 2017-10-15 ENCOUNTER — Ambulatory Visit (INDEPENDENT_AMBULATORY_CARE_PROVIDER_SITE_OTHER): Payer: Medicare Other | Admitting: Endocrinology

## 2017-10-15 ENCOUNTER — Encounter: Payer: Self-pay | Admitting: Endocrinology

## 2017-10-15 VITALS — BP 134/76 | HR 97 | Ht 67.0 in | Wt 222.6 lb

## 2017-10-15 DIAGNOSIS — E1165 Type 2 diabetes mellitus with hyperglycemia: Secondary | ICD-10-CM

## 2017-10-15 DIAGNOSIS — M810 Age-related osteoporosis without current pathological fracture: Secondary | ICD-10-CM | POA: Diagnosis not present

## 2017-10-15 DIAGNOSIS — R600 Localized edema: Secondary | ICD-10-CM | POA: Diagnosis not present

## 2017-10-15 DIAGNOSIS — I10 Essential (primary) hypertension: Secondary | ICD-10-CM

## 2017-10-15 MED ORDER — DOXAZOSIN MESYLATE 1 MG PO TABS: 1.0000 mg | ORAL_TABLET | Freq: Every day | ORAL | 1 refills | Status: DC

## 2017-10-15 NOTE — Progress Notes (Signed)
Susan Potter 84 y.o.            Reason for Appointment:   followup of various issues   History of Present Illness     HYPERTENSION:   Blood pressure is consistently controlled on 10 mg Ziac and 5 mg amlodipine  She has had swelling of her legs just in the last couple of weeks also, more on the left  Does not monitor blood pressure at home   BP Readings from Last 3 Encounters:  10/15/17 134/76  07/15/17 108/80  04/16/17 118/76     Type 2 DIABETES MELITUS, date of diagnosis: 2002   She is being seen for follow up of Type 2 diabetes, treated with oral hypoglycemic drugs.   Current oral hypoglycemic regimen:.  Amaryl 2 mg, 1.5 tablets at dinner, Janumet 50/1000, 1 tablet twice a day  Her A1c appears to be about the same but gradually increasing from 7.3 up to 7.7 now  Current management and problems:  She is not checking her blood sugars as she forgets  Although she was previously taking Janumet twice a day for some reason she was only taking this in the morning until a couple of days ago when she decided to take it twice a day as prescribed  Her weight is up but she is also having swelling  Lab glucose was 192 fasting  Her physical activity is limited by back pain but she tries to be a little active as much as tolerated   Side effects from medications: None  Glucometer: One Touch.  Blood Glucose readings: none  Compliance with diet: Avoiding sweets but is still eating more fruits like grapes, cereal in the morning, getting an egg in am for breakfast sometimes,  Physical activity: a little walking at home, Does not like to go out because of back pain  Diabetes complications: None  Wt Readings from Last 3 Encounters:  10/15/17 222 lb 9.6 oz (101 kg)  07/15/17 218 lb (98.9 kg)  04/16/17 218 lb 3.2 oz (99 kg)    Lab Results  Component Value Date   HGBA1C 7.7 (H) 10/12/2017   HGBA1C 7.5 07/15/2017   HGBA1C 7.3 (H) 02/08/2017   Lab Results  Component  Value Date   MICROALBUR 1.1 10/12/2017   LDLCALC 52 07/15/2017   CREATININE 1.24 (H) 10/12/2017      HYPERLIPIDEMIA: Has had good control, she has been taking Lipitor regularly   Lab Results  Component Value Date   CHOL 117 07/15/2017   HDL 47.30 07/15/2017   LDLCALC 52 07/15/2017   LDLDIRECT 84.6 01/08/2014   TRIG 88.0 07/15/2017   CHOLHDL 2 07/15/2017    Other active issues: See review of systems     Allergies as of 10/15/2017   No Known Allergies     Medication List        Accurate as of 10/15/17  8:15 AM. Always use your most recent med list.          amLODipine 5 MG tablet Commonly known as:  NORVASC Take 1 tablet (5 mg total) by mouth daily.   atorvastatin 10 MG tablet Commonly known as:  LIPITOR Take 1 tablet (10 mg total) by mouth daily.   bisoprolol-hydrochlorothiazide 10-6.25 MG tablet Commonly known as:  ZIAC Take 1 tablet by mouth daily.   glimepiride 2 MG tablet Commonly known as:  AMARYL take 1.5 tablets by mouth once daily AT DINNER TIME   glucose blood test strip One touch Ultra 2 test  strips only   ONE TOUCH ULTRA SYSTEM KIT w/Device Kit One touch Ultra 2 Glucometer   OVER THE COUNTER MEDICATION Place 1 drop into both eyes daily as needed (dry eyes/itching).   raloxifene 60 MG tablet Commonly known as:  EVISTA Take 60 mg by mouth daily.   sitaGLIPtin-metformin 50-1000 MG tablet Commonly known as:  JANUMET Take 1 tablet by mouth 2 (two) times daily with a meal.   Vitamin D (Ergocalciferol) 50000 units Caps capsule Commonly known as:  DRISDOL take 1 capsule by mouth every week ON SUNDAYS   warfarin 5 MG tablet Commonly known as:  COUMADIN Take as directed by the anticoagulation clinic. If you are unsure how to take this medication, talk to your nurse or doctor. Original instructions:  Take 1 tablet daily except 1 1/2 on Monday and Fridays or AS DIRECTED BY ANTICOAGULATION CLINIC       Allergies: No Known Allergies  Past  Medical History:  Diagnosis Date  . Atrial fibrillation ([place])   . Diabetes mellitus without complication ([place])   . Hypertension   . Osteoporosis   . Retinal vein occlusion     History reviewed. No pertinent surgical history.  Family History  Problem Relation Age of Onset  . Heart disease Brother     Social History:  reports that she has never smoked. She has never used smokeless tobacco. She reports that she does not drink alcohol. Her drug history is not on file.  Review of Systems -    Osteoporosis: On Evista Long-term and has been compliant with taking this No recent increase in back pain  She is taking her vitamin D 50,000 units weekly and her last level was normal  EDEMA: She has had some more swelling of her left leg which has occurred occasionally before However this is persistent over the last couple of weeks or more She is not having any leg pain and weight is up Not increasing her salt intake  Renal insufficiency: This has been fluctuating,  last creatinine improved:    Lab Results  Component Value Date   CREATININE 1.24 (H) 10/12/2017    Atrial fibrillation: No palpitations and continues to be on Coumadin  PREVENTIVE care: She refuses to have a complete physical exam today or schedule mammogram     Examination:   BP 134/76 (BP Location: Left Arm, Patient Position: Sitting, Cuff Size: Normal)   Pulse 97   Ht _0  (1.702 m)   Wt 222 lb 9.6 oz (101 kg)   SpO2 97%   BMI 34.86 kg/m   Body mass index is 34.86 kg/m.   2+ left lower leg and pedal edema present Trace right leg edema also present  No deformity of the spine present  Diabetic Foot Exam - Simple   Simple Foot Form Diabetic Foot exam was performed with the following findings:  Yes 10/15/2017  8:36 AM  Visual Inspection No deformities, no ulcerations, no other skin breakdown bilaterally:  Yes Sensation Testing Intact to touch and monofilament testing bilaterally:  Yes Pulse Check See  comments:  Yes Comments Decreased dorsalis pedis pulses, left posterior tibialis is not palpable because of edema       Assesment/Plan:   1. Diabetes type 2, with  obesity   See history of present illness for discussion of current diabetes management, blood sugar patterns and problems identified  Her A1c is gradually increasing and now 7.7  This is mostly related to her not taking her Janumet twice a  day as prescribed She can also cut back on high sugar foods such as grapes and cut back on serial in the morning She also agrees to try and check her blood sugar periodically Discussed blood sugar targets fasting and after meals  Also advised her to try walking at various stores with a shopping cart for exercise  2.  Renal dysfunction: This is stable  3.  LEG edema: This is more on the left side, likely to be from venous insufficiency but also worsened by using amlodipine Discussed how to use elastic stocking that she can get from the drugstore medical supply store   3.  HYPERTENSION: Blood pressure is controlled  However because of her leg edema she will stop amlodipine and try 1 mg doxazosin at bedtime Since she previously had worsening creatinine with ACE inhibitor/ARB will not try this Needs follow-up in about 6 weeks  History of osteoporosis: She can continue to be on Evista long-term  5.  Lipids: To continue Lipitor  6.  Atrial fibrillation on anticoagulation: Her last INR was high and she will continue to work with the Coumadin clinic  Total visit time for evaluation and management of multiple problems and counseling =25 minutes  Elayne Snare 10/15/2017, 8:15 AM

## 2017-10-15 NOTE — Patient Instructions (Addendum)
TED HOSE, Miracle socks at drugstore  Change amlodipine to new Rx

## 2017-10-26 ENCOUNTER — Ambulatory Visit (INDEPENDENT_AMBULATORY_CARE_PROVIDER_SITE_OTHER): Payer: Medicare Other | Admitting: General Practice

## 2017-10-26 DIAGNOSIS — Z7901 Long term (current) use of anticoagulants: Secondary | ICD-10-CM

## 2017-10-26 DIAGNOSIS — I4891 Unspecified atrial fibrillation: Secondary | ICD-10-CM

## 2017-10-26 LAB — POCT INR: INR: 2.4 (ref 2.0–3.0)

## 2017-10-26 NOTE — Patient Instructions (Signed)
Pre visit review using our clinic review tool, if applicable. No additional management support is needed unless otherwise documented below in the visit note.  Continue to take 1 tablet daily except 1 1/2 on Fridays. Re-check in 4 weeks.  

## 2017-11-08 ENCOUNTER — Telehealth: Payer: Self-pay | Admitting: Emergency Medicine

## 2017-11-08 ENCOUNTER — Other Ambulatory Visit: Payer: Self-pay

## 2017-11-08 MED ORDER — BISOPROLOL-HYDROCHLOROTHIAZIDE 10-6.25 MG PO TABS: 1.0000 | ORAL_TABLET | Freq: Every day | ORAL | 3 refills | Status: DC

## 2017-11-08 MED ORDER — VITAMIN D (ERGOCALCIFEROL) 1.25 MG (50000 UNIT) PO CAPS: ORAL_CAPSULE | ORAL | 5 refills | Status: DC

## 2017-11-08 NOTE — Telephone Encounter (Signed)
Pt called and wants to know if she can get a refill on the prescriptions bisoprolol-hydrochlorothiazide (ZIAC) 10-6.25 MG tablet and Vitamin D, Ergocalciferol, (DRISDOL) 50000 units CAPS capsule. Pharmacy is [facility]- [address] Thanks.

## 2017-11-08 NOTE — Telephone Encounter (Signed)
These are long-term prescriptions and can be refilled

## 2017-11-08 NOTE — Telephone Encounter (Signed)
Rx has been sent for both medications. 

## 2017-11-23 ENCOUNTER — Ambulatory Visit (INDEPENDENT_AMBULATORY_CARE_PROVIDER_SITE_OTHER): Payer: Medicare Other | Admitting: General Practice

## 2017-11-23 DIAGNOSIS — Z7901 Long term (current) use of anticoagulants: Secondary | ICD-10-CM | POA: Diagnosis not present

## 2017-11-23 DIAGNOSIS — I4891 Unspecified atrial fibrillation: Secondary | ICD-10-CM

## 2017-11-23 LAB — POCT INR: INR: 4.1 — AB (ref 2.0–3.0)

## 2017-11-23 NOTE — Patient Instructions (Addendum)
Pre visit review using our clinic review tool, if applicable. No additional management support is needed unless otherwise documented below in the visit note.  Hold coumadin today and tomorrow and then continue to take 1 tablet daily except 1 1/2 on Fridays. Re-check in 2 to 3 weeks.

## 2017-11-29 NOTE — Progress Notes (Signed)
Susan Potter 84 y.o.            Reason for Appointment:   followup of various issues   History of Present Illness     HYPERTENSION:   Blood pressure is consistently controlled on 10 mg Ziac  She was on 5 mg amlodipine and this was changed to doxazosin 1 mg in June 2019 because of leg edema especially on the left Blood pressure is about the same although higher when taking initially She thinks her swelling is not as much  Does not monitor blood pressure at home   BP Readings from Last 3 Encounters:  11/30/17 132/72  10/15/17 134/76  07/15/17 108/80     Type 2 DIABETES MELITUS, date of diagnosis: 2002    Current oral hypoglycemic regimen:.  Amaryl 2 mg, 1.5 tablets at dinner, Janumet 50/1000, 1 tablet twice a day  Her last A1c was 7.7  Current management and problems:  She is still not checking her blood sugars as she forgets and was also reminded on the last visit  She has taken her medications consistently  Lab glucose was 192 fasting previously  Her physical activity is limited by back pain and she cannot walk much  She thinks her diet is fairly good with low fat intake usually    Side effects from medications: None  Glucometer: One Touch.   Blood Glucose readings: none  Compliance with diet: Avoiding sweets but is still eating more fruits like grapes, cereal in the morning, getting an egg in am for breakfast sometimes,  Physical activity: a little walking at home, Does not like to go out because of back pain  Diabetes complications: None  Wt Readings from Last 3 Encounters:  11/30/17 218 lb 6.4 oz (99.1 kg)  10/15/17 222 lb 9.6 oz (101 kg)  07/15/17 218 lb (98.9 kg)    Lab Results  Component Value Date   HGBA1C 7.7 (H) 10/12/2017   HGBA1C 7.5 07/15/2017   HGBA1C 7.3 (H) 02/08/2017   Lab Results  Component Value Date   MICROALBUR 1.1 10/12/2017   LDLCALC 52 07/15/2017   CREATININE 1.24 (H) 10/12/2017      HYPERLIPIDEMIA: Has had good  control, she has been taking Lipitor regularly   Lab Results  Component Value Date   CHOL 117 07/15/2017   HDL 47.30 07/15/2017   LDLCALC 52 07/15/2017   LDLDIRECT 84.6 01/08/2014   TRIG 88.0 07/15/2017   CHOLHDL 2 07/15/2017    Other active issues: See review of systems     Allergies as of 11/30/2017   No Known Allergies     Medication List        Accurate as of 11/30/17  8:40 AM. Always use your most recent med list.          amLODipine 5 MG tablet Commonly known as:  NORVASC Take 1 tablet (5 mg total) by mouth daily.   atorvastatin 10 MG tablet Commonly known as:  LIPITOR Take 1 tablet (10 mg total) by mouth daily.   bisoprolol-hydrochlorothiazide 10-6.25 MG tablet Commonly known as:  ZIAC Take 1 tablet by mouth daily.   doxazosin 1 MG tablet Commonly known as:  CARDURA Take 1 tablet (1 mg total) by mouth daily.   glimepiride 2 MG tablet Commonly known as:  AMARYL take 1.5 tablets by mouth once daily AT DINNER TIME   glucose blood test strip One touch Ultra 2 test strips only   ONE TOUCH ULTRA SYSTEM KIT w/Device Kit One  touch Ultra 2 Glucometer   OVER THE COUNTER MEDICATION Place 1 drop into both eyes daily as needed (dry eyes/itching).   raloxifene 60 MG tablet Commonly known as:  EVISTA Take 60 mg by mouth daily.   sitaGLIPtin-metformin 50-1000 MG tablet Commonly known as:  JANUMET Take 1 tablet by mouth 2 (two) times daily with a meal.   Vitamin D (Ergocalciferol) 50000 units Caps capsule Commonly known as:  DRISDOL take 1 capsule by mouth every week ON SUNDAYS   warfarin 5 MG tablet Commonly known as:  COUMADIN Take as directed by the anticoagulation clinic. If you are unsure how to take this medication, talk to your nurse or doctor. Original instructions:  Take 1 tablet daily except 1 1/2 on Monday and Fridays or AS DIRECTED BY ANTICOAGULATION CLINIC       Allergies: No Known Allergies  Past Medical History:  Diagnosis Date  .  Atrial fibrillation ([place])   . Diabetes mellitus without complication ([place])   . Hypertension   . Osteoporosis   . Retinal vein occlusion     No past surgical history on file.  Family History  Problem Relation Age of Onset  . Heart disease Brother     Social History:  reports that she has never smoked. She has never used smokeless tobacco. She reports that she does not drink alcohol. Her drug history is not on file.  Review of Systems -   She recently had diarrhea with treated with Imodium OTC and this is better now  Osteoporosis: On Evista Long-term and has been compliant with taking this No episodes of back pain  She is taking her vitamin D 50,000 units weekly and her last level was normal  Renal insufficiency: This has been fluctuating,  last creatinine near normal  Lab Results  Component Value Date   CREATININE 1.24 (H) 10/12/2017   CREATININE 1.20 07/15/2017   CREATININE 1.23 (H) 02/08/2017     Atrial fibrillation: Heart rate has been controlled, continues to be on Coumadin  PREVENTIVE care: She refuses to have a complete physical exam today or schedule mammogram     Examination:   BP 132/72 (Cuff Size: Large)   Pulse 100   Temp 98.3 F (36.8 C) (Oral)   Ht _0  (1.702 m)   Wt 218 lb 6.4 oz (99.1 kg)   SpO2 97%   BMI 34.21 kg/m   Body mass index is 34.21 kg/m.   No edema on the right Trace left leg edema also present  Heart rate appears to be regular, rate about 76     Assesment/Plan:   1. Diabetes type 2, with  obesity   See history of present illness for discussion of current diabetes management, blood sugar patterns and problems identified  Her A1c on the last visit was 7.7  She was reminded to take her Janumet twice a day consistently and start checking her sugars  2.  Atrial fibrillation: On exam her heart rate appears to be quite regular but EKG still shows atrial fibrillation with controlled rate  3.  LEG edema: This is improved with  switching from amlodipine to doxazosin  3.  HYPERTENSION: Blood pressure is controlled  No side effects from doxazosin and she can continue the same dose  5.  Lipids: To check lipids on the next visit, meanwhile continue Lipitor  We will check her INR again today as it was high last week   Susan Snare 11/30/2017, 8:40 AM

## 2017-11-30 ENCOUNTER — Ambulatory Visit (INDEPENDENT_AMBULATORY_CARE_PROVIDER_SITE_OTHER): Payer: Medicare Other | Admitting: Endocrinology

## 2017-11-30 ENCOUNTER — Encounter: Payer: Self-pay | Admitting: Endocrinology

## 2017-11-30 VITALS — BP 132/72 | HR 100 | Temp 98.3°F | Ht 67.0 in | Wt 218.4 lb

## 2017-11-30 DIAGNOSIS — E559 Vitamin D deficiency, unspecified: Secondary | ICD-10-CM | POA: Diagnosis not present

## 2017-11-30 DIAGNOSIS — E669 Obesity, unspecified: Secondary | ICD-10-CM | POA: Diagnosis not present

## 2017-11-30 DIAGNOSIS — I10 Essential (primary) hypertension: Secondary | ICD-10-CM

## 2017-11-30 DIAGNOSIS — I4891 Unspecified atrial fibrillation: Secondary | ICD-10-CM

## 2017-11-30 DIAGNOSIS — E1169 Type 2 diabetes mellitus with other specified complication: Secondary | ICD-10-CM

## 2017-11-30 DIAGNOSIS — Z7901 Long term (current) use of anticoagulants: Secondary | ICD-10-CM

## 2017-11-30 LAB — COMPREHENSIVE METABOLIC PANEL
ALBUMIN: 4 g/dL (ref 3.5–5.2)
ALK PHOS: 40 U/L (ref 39–117)
ALT: 7 U/L (ref 0–35)
AST: 12 U/L (ref 0–37)
BUN: 17 mg/dL (ref 6–23)
CO2: 26 mEq/L (ref 19–32)
CREATININE: 1.32 mg/dL — AB (ref 0.40–1.20)
Calcium: 9.3 mg/dL (ref 8.4–10.5)
Chloride: 100 mEq/L (ref 96–112)
GFR: 40.22 mL/min — ABNORMAL LOW (ref >60.00)
Glucose, Bld: 154 mg/dL — ABNORMAL HIGH (ref 70–99)
Potassium: 4.2 mEq/L (ref 3.5–5.1)
Sodium: 136 mEq/L (ref 135–145)
TOTAL PROTEIN: 7.7 g/dL (ref 6.0–8.3)
Total Bilirubin: 1.2 mg/dL (ref 0.2–1.2)

## 2017-11-30 LAB — PROTIME-INR
INR: 2 ratio — AB (ref 0.8–1.0)
Prothrombin Time: 22.9 s — ABNORMAL HIGH (ref 9.6–13.1)

## 2017-11-30 LAB — CBC WITH DIFFERENTIAL/PLATELET
BASOS ABS: 0.1 10*3/uL (ref 0.0–0.1)
Basophils Relative: 0.8 % (ref 0.0–3.0)
EOS ABS: 0.2 10*3/uL (ref 0.0–0.7)
EOS PCT: 2.2 % (ref 0.0–5.0)
HCT: 38.9 % (ref 36.0–46.0)
HEMOGLOBIN: 12.8 g/dL (ref 12.0–15.0)
Lymphocytes Relative: 9.1 % — ABNORMAL LOW (ref 12.0–46.0)
Lymphs Abs: 0.9 10*3/uL (ref 0.7–4.0)
MCHC: 33 g/dL (ref 30.0–36.0)
MCV: 87.7 fl (ref 78.0–100.0)
Monocytes Absolute: 0.7 10*3/uL (ref 0.1–1.0)
Monocytes Relative: 7.2 % (ref 3.0–12.0)
Neutro Abs: 8.2 10*3/uL — ABNORMAL HIGH (ref 1.4–7.7)
Neutrophils Relative %: 80.7 % — ABNORMAL HIGH (ref 43.0–77.0)
Platelets: 241 10*3/uL (ref 150.0–400.0)
RBC: 4.44 Mil/uL (ref 3.87–5.11)
RDW: 13.8 % (ref 11.5–15.5)
WBC: 10.2 10*3/uL (ref 4.0–10.5)

## 2017-12-06 LAB — HM DIABETES EYE EXAM

## 2017-12-21 ENCOUNTER — Ambulatory Visit (INDEPENDENT_AMBULATORY_CARE_PROVIDER_SITE_OTHER): Payer: Medicare Other | Admitting: General Practice

## 2017-12-21 DIAGNOSIS — I4891 Unspecified atrial fibrillation: Secondary | ICD-10-CM

## 2017-12-21 DIAGNOSIS — Z7901 Long term (current) use of anticoagulants: Secondary | ICD-10-CM | POA: Diagnosis not present

## 2017-12-21 DIAGNOSIS — Z23 Encounter for immunization: Secondary | ICD-10-CM

## 2017-12-21 LAB — POCT INR: INR: 2.7 (ref 2.0–3.0)

## 2017-12-21 NOTE — Patient Instructions (Signed)
Pre visit review using our clinic review tool, if applicable. No additional management support is needed unless otherwise documented below in the visit note.  Continue to take 1 tablet daily except 1 1/2 on Fridays. Re-check in 4 weeks.

## 2017-12-23 ENCOUNTER — Telehealth: Payer: Self-pay | Admitting: Emergency Medicine

## 2017-12-23 NOTE — Telephone Encounter (Signed)
Pt called and requested a refill on her glimepiride (AMARYL) 2 MG tablet and atorvastatin (LIPITOR) 10 MG tablet. Pharmacy is [facility] on [address] Thanks

## 2017-12-23 NOTE — Telephone Encounter (Signed)
Pt called and requested a refill on her glimepiride (AMARYL) 2 MG tablet and atorvastatin (LIPITOR) 10 MG tablet. Pharmacy is [facility] on [address] Thanks   

## 2017-12-24 MED ORDER — GLIMEPIRIDE 2 MG PO TABS: ORAL_TABLET | ORAL | 3 refills | Status: DC

## 2017-12-24 MED ORDER — ATORVASTATIN CALCIUM 10 MG PO TABS: 10.0000 mg | ORAL_TABLET | Freq: Every day | ORAL | 3 refills | Status: DC

## 2017-12-24 NOTE — Telephone Encounter (Signed)
Sent!

## 2018-01-18 ENCOUNTER — Ambulatory Visit (INDEPENDENT_AMBULATORY_CARE_PROVIDER_SITE_OTHER): Payer: Medicare Other | Admitting: General Practice

## 2018-01-18 DIAGNOSIS — I4821 Permanent atrial fibrillation: Secondary | ICD-10-CM

## 2018-01-18 DIAGNOSIS — Z7901 Long term (current) use of anticoagulants: Secondary | ICD-10-CM

## 2018-01-18 LAB — POCT INR: INR: 5.2 — AB (ref 2.0–3.0)

## 2018-01-18 NOTE — Patient Instructions (Addendum)
Pre visit review using our clinic review tool, if applicable. No additional management support is needed unless otherwise documented below in the visit note.  Hold coumadin through Thursday.  On Friday continue to take 1 tablet daily except 1 1/2 on Fridays. Re-check in 2 weeks.  Please report any unusual bleeding.

## 2018-01-21 ENCOUNTER — Other Ambulatory Visit: Payer: Self-pay | Admitting: Endocrinology

## 2018-01-21 DIAGNOSIS — I482 Chronic atrial fibrillation, unspecified: Secondary | ICD-10-CM

## 2018-01-21 NOTE — Telephone Encounter (Signed)
May refill Coumadin indefinitely

## 2018-01-21 NOTE — Telephone Encounter (Signed)
Please advise on Coumadin refill.  

## 2018-02-01 ENCOUNTER — Ambulatory Visit (INDEPENDENT_AMBULATORY_CARE_PROVIDER_SITE_OTHER): Payer: Medicare Other | Admitting: General Practice

## 2018-02-01 DIAGNOSIS — Z7901 Long term (current) use of anticoagulants: Secondary | ICD-10-CM | POA: Diagnosis not present

## 2018-02-01 DIAGNOSIS — I4891 Unspecified atrial fibrillation: Secondary | ICD-10-CM

## 2018-02-01 LAB — POCT INR: INR: 3.7 — AB (ref 2.0–3.0)

## 2018-02-01 NOTE — Patient Instructions (Signed)
Pre visit review using our clinic review tool, if applicable. No additional management support is needed unless otherwise documented below in the visit note. Hold coumadin today and then change dosage and take 1 tablet daily.  Re-check in 4 weeks.

## 2018-02-11 ENCOUNTER — Telehealth: Payer: Self-pay | Admitting: Endocrinology

## 2018-02-11 NOTE — Telephone Encounter (Signed)
°*  STAT* If patient is at the pharmacy, call can be transferred to refill team.   1. Which medications need to be refilled? (please list name of each medication and dose if known) sitaGLIPtin-metformin (JANUMET) 50-1000 MG tablet  2. Which pharmacy/location (including street and city if local pharmacy) is medication to be sent to? [facility]   3. Do they need a 30 day or 90 day supply? 30

## 2018-02-15 ENCOUNTER — Other Ambulatory Visit: Payer: Self-pay | Admitting: Endocrinology

## 2018-02-15 MED ORDER — SITAGLIPTIN PHOS-METFORMIN HCL 50-1000 MG PO TABS: 1.0000 | ORAL_TABLET | Freq: Two times a day (BID) | ORAL | 1 refills | Status: DC

## 2018-02-15 NOTE — Telephone Encounter (Signed)
Done sent Rx janumet to-- [facility]

## 2018-02-28 ENCOUNTER — Encounter: Payer: Self-pay | Admitting: *Deleted

## 2018-03-01 ENCOUNTER — Ambulatory Visit (INDEPENDENT_AMBULATORY_CARE_PROVIDER_SITE_OTHER): Payer: Medicare Other | Admitting: General Practice

## 2018-03-01 DIAGNOSIS — I4891 Unspecified atrial fibrillation: Secondary | ICD-10-CM

## 2018-03-01 DIAGNOSIS — Z7901 Long term (current) use of anticoagulants: Secondary | ICD-10-CM

## 2018-03-01 LAB — POCT INR: INR: 4.2 — AB (ref 2.0–3.0)

## 2018-03-01 NOTE — Patient Instructions (Signed)
Pre visit review using our clinic review tool, if applicable. No additional management support is needed unless otherwise documented below in the visit note.  Hold coumadin today and tomorrow and  then take 1 tablet daily.  Re-check in 3 weeks.

## 2018-03-07 ENCOUNTER — Other Ambulatory Visit (INDEPENDENT_AMBULATORY_CARE_PROVIDER_SITE_OTHER): Payer: Medicare Other

## 2018-03-07 DIAGNOSIS — E669 Obesity, unspecified: Secondary | ICD-10-CM | POA: Diagnosis not present

## 2018-03-07 DIAGNOSIS — E1169 Type 2 diabetes mellitus with other specified complication: Secondary | ICD-10-CM | POA: Diagnosis not present

## 2018-03-07 DIAGNOSIS — E559 Vitamin D deficiency, unspecified: Secondary | ICD-10-CM | POA: Diagnosis not present

## 2018-03-07 LAB — LIPID PANEL
CHOL/HDL RATIO: 3
Cholesterol: 130 mg/dL (ref 0–200)
HDL: 42.5 mg/dL (ref >39.00)
LDL CALC: 68 mg/dL (ref 0–99)
NONHDL: 87.45
TRIGLYCERIDES: 96 mg/dL (ref 0.0–149.0)
VLDL: 19.2 mg/dL (ref 0.0–40.0)

## 2018-03-07 LAB — BASIC METABOLIC PANEL
BUN: 15 mg/dL (ref 6–23)
CO2: 26 mEq/L (ref 19–32)
CREATININE: 1.2 mg/dL (ref 0.40–1.20)
Calcium: 9.3 mg/dL (ref 8.4–10.5)
Chloride: 100 mEq/L (ref 96–112)
GFR: 44.87 mL/min — ABNORMAL LOW (ref >60.00)
Glucose, Bld: 141 mg/dL — ABNORMAL HIGH (ref 70–99)
POTASSIUM: 4 meq/L (ref 3.5–5.1)
Sodium: 138 mEq/L (ref 135–145)

## 2018-03-07 LAB — VITAMIN D 25 HYDROXY (VIT D DEFICIENCY, FRACTURES): VITD: 42.47 ng/mL (ref 30.00–100.00)

## 2018-03-07 LAB — HEMOGLOBIN A1C: HEMOGLOBIN A1C: 7.5 % — AB (ref 4.6–6.5)

## 2018-03-10 ENCOUNTER — Telehealth: Payer: Self-pay | Admitting: General Practice

## 2018-03-10 ENCOUNTER — Encounter: Payer: Self-pay | Admitting: Endocrinology

## 2018-03-10 ENCOUNTER — Ambulatory Visit (INDEPENDENT_AMBULATORY_CARE_PROVIDER_SITE_OTHER): Payer: Medicare Other | Admitting: Endocrinology

## 2018-03-10 VITALS — BP 132/76 | HR 80 | Ht 67.0 in | Wt 214.0 lb

## 2018-03-10 DIAGNOSIS — E669 Obesity, unspecified: Secondary | ICD-10-CM | POA: Diagnosis not present

## 2018-03-10 DIAGNOSIS — I4821 Permanent atrial fibrillation: Secondary | ICD-10-CM | POA: Diagnosis not present

## 2018-03-10 DIAGNOSIS — I10 Essential (primary) hypertension: Secondary | ICD-10-CM | POA: Diagnosis not present

## 2018-03-10 DIAGNOSIS — E1169 Type 2 diabetes mellitus with other specified complication: Secondary | ICD-10-CM

## 2018-03-10 NOTE — Progress Notes (Signed)
Susan Potter 84 y.o.            Reason for Appointment:   followup of various issues   History of Present Illness     HYPERTENSION:   Blood pressure is consistently controlled on 10 mg Ziac  She was on 5 mg amlodipine and this was changed to doxazosin 1 mg in June 2019 because of leg edema especially on the left Blood pressure is about the same although higher when taking initially She thinks her swelling is not as much  Does not monitor blood pressure at home   BP Readings from Last 3 Encounters:  03/10/18 132/76  11/30/17 132/72  10/15/17 134/76     Type 2 DIABETES MELITUS, date of diagnosis: 2002    Current oral hypoglycemic regimen:.  Amaryl 2 mg, 1.5 tablets at dinner, Janumet 50/1000, 1 tablet twice a day  Her last A1c was 7.7 and is now 7.5  Current management and problems:  She is still not checking her blood sugars as she forgets and was also reminded on the last visit  She has taken her medications consistently  Lab glucose was 192 fasting previously  Her physical activity is limited by back pain and she cannot walk much  She thinks her diet is fairly good with low fat intake usually    Side effects from medications: None  Glucometer: One Touch.   Blood Glucose readings: none  Compliance with diet: Avoiding sweets but is still eating more fruits like grapes, cereal in the morning, getting an egg in am for breakfast sometimes,  Physical activity: a little walking at home, Does not like to go out because of back pain  Diabetes complications: None  Wt Readings from Last 3 Encounters:  03/10/18 214 lb (97.1 kg)  11/30/17 218 lb 6.4 oz (99.1 kg)  10/15/17 222 lb 9.6 oz (101 kg)    Lab Results  Component Value Date   HGBA1C 7.5 (H) 03/07/2018   HGBA1C 7.7 (H) 10/12/2017   HGBA1C 7.5 07/15/2017   Lab Results  Component Value Date   MICROALBUR 1.1 10/12/2017   LDLCALC 68 03/07/2018   CREATININE 1.20 03/07/2018      HYPERLIPIDEMIA:  Has had good control, she has been taking Lipitor regularly   Lab Results  Component Value Date   CHOL 130 03/07/2018   HDL 42.50 03/07/2018   LDLCALC 68 03/07/2018   LDLDIRECT 84.6 01/08/2014   TRIG 96.0 03/07/2018   CHOLHDL 3 03/07/2018    Other active issues: See review of systems     Allergies as of 03/10/2018   No Known Allergies     Medication List        Accurate as of 03/10/18  8:11 AM. Always use your most recent med list.          amLODipine 5 MG tablet Commonly known as:  NORVASC Take 1 tablet (5 mg total) by mouth daily.   atorvastatin 10 MG tablet Commonly known as:  LIPITOR Take 1 tablet (10 mg total) by mouth daily.   bisoprolol-hydrochlorothiazide 10-6.25 MG tablet Commonly known as:  ZIAC Take 1 tablet by mouth daily.   doxazosin 1 MG tablet Commonly known as:  CARDURA TAKE 1 TABLET BY MOUTH EVERY DAY   glimepiride 2 MG tablet Commonly known as:  AMARYL take 1.5 tablets by mouth once daily AT DINNER TIME   glucose blood test strip One touch Ultra 2 test strips only   ONE TOUCH ULTRA SYSTEM KIT w/Device Kit  One touch Ultra 2 Glucometer   OVER THE COUNTER MEDICATION Place 1 drop into both eyes daily as needed (dry eyes/itching).   raloxifene 60 MG tablet Commonly known as:  EVISTA Take 60 mg by mouth daily.   sitaGLIPtin-metformin 50-1000 MG tablet Commonly known as:  JANUMET Take 1 tablet by mouth 2 (two) times daily with a meal.   Vitamin D (Ergocalciferol) 1.25 MG (50000 UT) Caps capsule Commonly known as:  DRISDOL take 1 capsule by mouth every week ON SUNDAYS   warfarin 5 MG tablet Commonly known as:  COUMADIN Take as directed by the anticoagulation clinic. If you are unsure how to take this medication, talk to your nurse or doctor. Original instructions:  TAKE 1 TABLET BY MOUTH DAILY, EXCEPT 1 AND 1/2 TABLETS ON MONDAY AND FRIDAY OR AS DIRECTED BY ANTICOAGULATION CLINIC       Allergies: No Known Allergies  Past Medical  History:  Diagnosis Date  . Atrial fibrillation ([place])   . Diabetes mellitus without complication ([place])   . Hypertension   . Osteoporosis   . Retinal vein occlusion     No past surgical history on file.  Family History  Problem Relation Age of Onset  . Heart disease Brother     Social History:  reports that she has never smoked. She has never used smokeless tobacco. She reports that she does not drink alcohol. Her drug history is not on file.  Review of Systems -     Osteoporosis: On Evista Long-term with no side effects    She is taking her vitamin D 50,000 units weekly and her last level was normal  Renal insufficiency: Mild and now improved  Lab Results  Component Value Date   CREATININE 1.20 03/07/2018   CREATININE 1.32 (H) 11/30/2017   CREATININE 1.24 (H) 10/12/2017     Atrial fibrillation: Heart rate has been controlled, continues to be on Coumadin  PREVENTIVE care: She has refused to have complete physical exam or schedule mammogram     Examination:   BP 132/76   Pulse 80   Ht 5' 7" (1.702 m)   Wt 214 lb (97.1 kg)   SpO2 97%   BMI 33.52 kg/m   Body mass index is 33.52 kg/m.   No significant lower leg edema present     Assesment/Plan:   1. Diabetes type 2, with  obesity   See history of present illness for discussion of current diabetes management, blood sugar patterns and problems identified  Her A1c is slightly better at 7.5, previously 7.7  Considering her age her level of control is adequate She is not willing to check her sugars regularly has no hypoglycemia with Amaryl She does need to take her Janumet twice a day consistently this can be continued unchanged with her renal function being adequate   2.  Atrial fibrillation: She has a controlled rate  3.  LEG edema: Not significant and may be only present in the evening because of venous insufficiency   4  HYPERTENSION: Blood pressure is under consistent control with doxazosin and  Ziac   5.  Lipids: Adequately controlled and she will continue Lipitor  6.  Coumadin dose need to be reduced and will send a message to the Coumadin clinic nurse to make adjustments and follow-up this week or next with another INR  7.  Vitamin D level is therapeutic at 42.5 she will continue the same regimen  Elayne Snare 03/10/2018, 8:11 AM

## 2018-03-10 NOTE — Telephone Encounter (Signed)
Spoke with patient's daughter [place] MutaDiane.  Reiterated patient's dosing schedule for coumadin.  Patient is supposed to be taking 5 mg daily.  Note from Dr. Toney ReilKuman said that patient was taking 5 mg daily except 7.5 mg on Fridays.  I communicated this with Diane and asked if she would make sure patient gets correct dosage.  Patient will also have INR checked this Tuesday at 8:45.  Results will be routed to Dr. Lucianne MussKumar.  Patient's daughter verbalized understanding.

## 2018-03-10 NOTE — Patient Instructions (Signed)
No extra 1/2 Coumadin on Fridays

## 2018-03-13 ENCOUNTER — Other Ambulatory Visit: Payer: Self-pay | Admitting: Endocrinology

## 2018-03-13 DIAGNOSIS — I482 Chronic atrial fibrillation, unspecified: Secondary | ICD-10-CM

## 2018-03-14 ENCOUNTER — Other Ambulatory Visit: Payer: Self-pay

## 2018-03-14 ENCOUNTER — Telehealth: Payer: Self-pay | Admitting: Endocrinology

## 2018-03-14 MED ORDER — BISOPROLOL-HYDROCHLOROTHIAZIDE 10-6.25 MG PO TABS: 1.0000 | ORAL_TABLET | Freq: Every day | ORAL | 3 refills | Status: DC

## 2018-03-14 MED ORDER — DOXAZOSIN MESYLATE 1 MG PO TABS: 1.0000 mg | ORAL_TABLET | Freq: Every day | ORAL | 0 refills | Status: DC

## 2018-03-14 NOTE — Telephone Encounter (Signed)
Rx sent 

## 2018-03-14 NOTE — Telephone Encounter (Signed)
Patient has called in regards to filling   bisoprolol-hydrochlorothiazide (ZIAC) 10-6.25 MG tablet  doxazosin (CARDURA) 1 MG tablet They have contacted pharmacy and stated they called us. I informed patient we do not usually receive calls that they come in via fax. Patient confirmed and understood. Please Advise, thanks

## 2018-03-15 ENCOUNTER — Ambulatory Visit (INDEPENDENT_AMBULATORY_CARE_PROVIDER_SITE_OTHER): Payer: Medicare Other | Admitting: General Practice

## 2018-03-15 DIAGNOSIS — Z7901 Long term (current) use of anticoagulants: Secondary | ICD-10-CM | POA: Diagnosis not present

## 2018-03-15 DIAGNOSIS — I4891 Unspecified atrial fibrillation: Secondary | ICD-10-CM

## 2018-03-15 LAB — POCT INR: INR: 3.4 — AB (ref 2.0–3.0)

## 2018-03-15 NOTE — Patient Instructions (Signed)
Pre visit review using our clinic review tool, if applicable. No additional management support is needed unless otherwise documented below in the visit note.  Hold coumadin today (11/26) and then change dosage and take 1 tablet daily except 1/2 on Wednesdays.  Re-check in 3 weeks.

## 2018-03-29 ENCOUNTER — Telehealth: Payer: Self-pay | Admitting: Endocrinology

## 2018-03-29 ENCOUNTER — Other Ambulatory Visit: Payer: Self-pay

## 2018-03-29 MED ORDER — SITAGLIPTIN PHOS-METFORMIN HCL 50-1000 MG PO TABS: 1.0000 | ORAL_TABLET | Freq: Two times a day (BID) | ORAL | 1 refills | Status: DC

## 2018-03-29 NOTE — Telephone Encounter (Signed)
rx sent

## 2018-03-29 NOTE — Telephone Encounter (Signed)
MEDICATION: sitaGLIPtin-metformin (JANUMET) 50-1000 MG tablet  PHARMACY:  [facility] on [address]  IS THIS A 90 DAY SUPPLY : 30 day   IS PATIENT OUT OF MEDICATION: yes  IF NOT; HOW MUCH IS LEFT:   LAST APPOINTMENT DATE: @11 /25/2019  NEXT APPOINTMENT DATE:@3 /16/2020  DO WE HAVE YOUR PERMISSION TO LEAVE A DETAILED MESSAGE: yes  OTHER COMMENTS:    **Let patient know to contact pharmacy at the end of the day to make sure medication is ready. **  ** Please notify patient to allow 48-72 hours to process**  **Encourage patient to contact the pharmacy for refills or they can request refills through [facility]**

## 2018-04-08 ENCOUNTER — Ambulatory Visit (INDEPENDENT_AMBULATORY_CARE_PROVIDER_SITE_OTHER): Payer: Medicare Other | Admitting: General Practice

## 2018-04-08 DIAGNOSIS — Z7901 Long term (current) use of anticoagulants: Secondary | ICD-10-CM | POA: Diagnosis not present

## 2018-04-08 LAB — POCT INR: INR: 1.7 — AB (ref 2.0–3.0)

## 2018-04-08 NOTE — Patient Instructions (Addendum)
Pre visit review using our clinic review tool, if applicable. No additional management support is needed unless otherwise documented below in the visit note. Take 1 1/2 tablets today and then take 1 tablet daily.  Re-check in 4 weeks.   

## 2018-04-15 ENCOUNTER — Telehealth: Payer: Self-pay | Admitting: Endocrinology

## 2018-04-15 ENCOUNTER — Other Ambulatory Visit: Payer: Self-pay

## 2018-04-15 MED ORDER — SITAGLIPTIN PHOS-METFORMIN HCL 50-1000 MG PO TABS: 1.0000 | ORAL_TABLET | Freq: Two times a day (BID) | ORAL | 3 refills | Status: DC

## 2018-04-15 NOTE — Telephone Encounter (Signed)
Rx corrected.

## 2018-04-15 NOTE — Telephone Encounter (Signed)
The rx for janumet that was just called in was for only 30 tablets and she needs 60

## 2018-04-25 ENCOUNTER — Telehealth: Payer: Self-pay | Admitting: Endocrinology

## 2018-04-25 ENCOUNTER — Other Ambulatory Visit: Payer: Self-pay

## 2018-04-25 MED ORDER — DOXAZOSIN MESYLATE 1 MG PO TABS: 1.0000 mg | ORAL_TABLET | Freq: Every day | ORAL | 0 refills | Status: DC

## 2018-04-25 NOTE — Telephone Encounter (Signed)
Rx sent 

## 2018-04-25 NOTE — Telephone Encounter (Signed)
doxazosin (CARDURA) 1 MG tablet   Patient stated that the pharmacy told her this medication was denied by Dr Lucianne Muss. And she could not get this filled. She would like a call back so she knows why

## 2018-05-06 ENCOUNTER — Ambulatory Visit (INDEPENDENT_AMBULATORY_CARE_PROVIDER_SITE_OTHER): Payer: Medicare Other | Admitting: General Practice

## 2018-05-06 DIAGNOSIS — I4821 Permanent atrial fibrillation: Secondary | ICD-10-CM

## 2018-05-06 DIAGNOSIS — Z7901 Long term (current) use of anticoagulants: Secondary | ICD-10-CM | POA: Diagnosis not present

## 2018-05-06 LAB — POCT INR: INR: 2.3 (ref 2.0–3.0)

## 2018-05-06 NOTE — Patient Instructions (Addendum)
Pre visit review using our clinic review tool, if applicable. No additional management support is needed unless otherwise documented below in the visit note.  Continue to take 1 tablet daily.  Re-check in 4 weeks.  

## 2018-05-29 ENCOUNTER — Other Ambulatory Visit: Payer: Self-pay | Admitting: Endocrinology

## 2018-06-03 ENCOUNTER — Ambulatory Visit (INDEPENDENT_AMBULATORY_CARE_PROVIDER_SITE_OTHER): Payer: Medicare Other | Admitting: General Practice

## 2018-06-03 DIAGNOSIS — Z7901 Long term (current) use of anticoagulants: Secondary | ICD-10-CM | POA: Diagnosis not present

## 2018-06-03 DIAGNOSIS — I4891 Unspecified atrial fibrillation: Secondary | ICD-10-CM

## 2018-06-03 LAB — POCT INR: INR: 1.8 — AB (ref 2.0–3.0)

## 2018-06-03 NOTE — Patient Instructions (Addendum)
Pre visit review using our clinic review tool, if applicable. No additional management support is needed unless otherwise documented below in the visit note.  Take 1 1/2 tablets today and then continue to take 1 tablet daily.  Re-check in 4 weeks. 

## 2018-06-13 ENCOUNTER — Telehealth: Payer: Self-pay | Admitting: Endocrinology

## 2018-06-13 ENCOUNTER — Other Ambulatory Visit: Payer: Self-pay

## 2018-06-13 MED ORDER — DOXAZOSIN MESYLATE 1 MG PO TABS: 1.0000 mg | ORAL_TABLET | Freq: Every day | ORAL | 0 refills | Status: DC

## 2018-06-13 MED ORDER — GLIMEPIRIDE 2 MG PO TABS: ORAL_TABLET | ORAL | 3 refills | Status: DC

## 2018-06-13 NOTE — Telephone Encounter (Signed)
Rx sent 

## 2018-06-13 NOTE — Telephone Encounter (Signed)
MEDICATION: Glimepiride 2MG     Doxazosin 1 MG  PHARMACY:  [facility] on [address]  IS THIS A 90 DAY SUPPLY : unknown  IS PATIENT OUT OF MEDICATION: yes  IF NOT; HOW MUCH IS LEFT:   LAST APPOINTMENT DATE: @2 /12/2018  NEXT APPOINTMENT DATE:@3 /16/2020  DO WE HAVE YOUR PERMISSION TO LEAVE A DETAILED MESSAGE: yes  OTHER COMMENTS:    **Let patient know to contact pharmacy at the end of the day to make sure medication is ready. **  ** Please notify patient to allow 48-72 hours to process**  **Encourage patient to contact the pharmacy for refills or they can request refills through [facility]**

## 2018-07-01 ENCOUNTER — Ambulatory Visit (INDEPENDENT_AMBULATORY_CARE_PROVIDER_SITE_OTHER): Payer: Medicare Other | Admitting: General Practice

## 2018-07-01 ENCOUNTER — Other Ambulatory Visit: Payer: Self-pay

## 2018-07-01 DIAGNOSIS — Z7901 Long term (current) use of anticoagulants: Secondary | ICD-10-CM

## 2018-07-01 DIAGNOSIS — I4891 Unspecified atrial fibrillation: Secondary | ICD-10-CM

## 2018-07-01 LAB — POCT INR: INR: 3.2 — AB (ref 2.0–3.0)

## 2018-07-01 NOTE — Patient Instructions (Addendum)
Pre visit review using our clinic review tool, if applicable. No additional management support is needed unless otherwise documented below in the visit   Hold coumadin today and then continue to take 1 tablet daily.   Re-check in 3 to 4 weeks.

## 2018-07-04 ENCOUNTER — Other Ambulatory Visit: Payer: Medicare Other

## 2018-07-07 ENCOUNTER — Ambulatory Visit: Payer: Medicare Other | Admitting: Endocrinology

## 2018-07-08 ENCOUNTER — Telehealth: Payer: Self-pay | Admitting: Endocrinology

## 2018-07-08 ENCOUNTER — Other Ambulatory Visit: Payer: Self-pay

## 2018-07-08 DIAGNOSIS — I482 Chronic atrial fibrillation, unspecified: Secondary | ICD-10-CM

## 2018-07-08 MED ORDER — WARFARIN SODIUM 5 MG PO TABS: ORAL_TABLET | ORAL | 0 refills | Status: DC

## 2018-07-08 NOTE — Telephone Encounter (Signed)
Ok needs to set-up f/u appt

## 2018-07-08 NOTE — Telephone Encounter (Signed)
Medication refilled.  Samara Deist, could you please call and reschedule her?

## 2018-07-08 NOTE — Telephone Encounter (Signed)
Ok to refill 

## 2018-07-08 NOTE — Telephone Encounter (Signed)
MEDICATION: warfarin (COUMADIN) 5 MG tablet  PHARMACY:  [facility] [phone] - [place], [place] - [address]  IS THIS A 90 DAY SUPPLY :   IS PATIENT OUT OF MEDICATION: yes  IF NOT; HOW MUCH IS LEFT:   LAST APPOINTMENT DATE: @3 /19/2020  NEXT APPOINTMENT DATE:@Visit  date not found  DO WE HAVE YOUR PERMISSION TO LEAVE A DETAILED MESSAGE:  OTHER COMMENTS:    **Let patient know to contact pharmacy at the end of the day to make sure medication is ready. **  ** Please notify patient to allow 48-72 hours to process**  **Encourage patient to contact the pharmacy for refills or they can request refills through [facility]**

## 2018-08-03 ENCOUNTER — Telehealth: Payer: Self-pay | Admitting: Emergency Medicine

## 2018-08-03 NOTE — Telephone Encounter (Signed)
Noted  

## 2018-08-03 NOTE — Telephone Encounter (Signed)
Pt had visit scheduled for 08/05/18. She requested to cancel and will call back to reschedule next week.

## 2018-08-05 ENCOUNTER — Ambulatory Visit: Payer: Medicare Other

## 2018-09-02 ENCOUNTER — Other Ambulatory Visit: Payer: Self-pay

## 2018-09-02 ENCOUNTER — Telehealth: Payer: Self-pay | Admitting: Endocrinology

## 2018-09-02 DIAGNOSIS — I482 Chronic atrial fibrillation, unspecified: Secondary | ICD-10-CM

## 2018-09-02 MED ORDER — SITAGLIPTIN PHOS-METFORMIN HCL 50-1000 MG PO TABS: 1.0000 | ORAL_TABLET | Freq: Two times a day (BID) | ORAL | 3 refills | Status: DC

## 2018-09-02 MED ORDER — BISOPROLOL-HYDROCHLOROTHIAZIDE 10-6.25 MG PO TABS: 1.0000 | ORAL_TABLET | Freq: Every day | ORAL | 3 refills | Status: DC

## 2018-09-02 MED ORDER — WARFARIN SODIUM 5 MG PO TABS: ORAL_TABLET | ORAL | 0 refills | Status: DC

## 2018-09-02 MED ORDER — DOXAZOSIN MESYLATE 1 MG PO TABS: 1.0000 mg | ORAL_TABLET | Freq: Every day | ORAL | 3 refills | Status: DC

## 2018-09-02 NOTE — Telephone Encounter (Signed)
MEDICATION: doxazosin (CARDURA) 1 MG tablet, warfarin (COUMADIN) 5 MG tablet, bisoprolol-hydrochlorothiazide (ZIAC) 10-6.25 MG tablet AND  sitaGLIPtin-metformin (JANUMET) 50-1000 MG tablet  PHARMACY:   [facility] [phone] - [place], [place] - [phone] [phone] (Phone) [phone] (Fax)    IS THIS A 90 DAY SUPPLY :  If possible  IS PATIENT OUT OF MEDICATION:  No  IF NOT; HOW MUCH IS LEFT: 1-2 Day  LAST APPOINTMENT DATE: @3 /20/2020  NEXT APPOINTMENT DATE:@Visit  date not found  DO WE HAVE YOUR PERMISSION TO LEAVE A DETAILED MESSAGE:  No  OTHER COMMENTS:    **Let patient know to contact pharmacy at the end of the day to make sure medication is ready. **  ** Please notify patient to allow 48-72 hours to process**  **Encourage patient to contact the pharmacy for refills or they can request refills through [facility]**

## 2018-09-02 NOTE — Telephone Encounter (Signed)
Rx sent 

## 2018-10-10 ENCOUNTER — Other Ambulatory Visit: Payer: Self-pay

## 2018-10-10 ENCOUNTER — Telehealth: Payer: Self-pay | Admitting: Endocrinology

## 2018-10-10 DIAGNOSIS — I482 Chronic atrial fibrillation, unspecified: Secondary | ICD-10-CM

## 2018-10-10 MED ORDER — ATORVASTATIN CALCIUM 10 MG PO TABS: 10.0000 mg | ORAL_TABLET | Freq: Every day | ORAL | 3 refills | Status: DC

## 2018-10-10 MED ORDER — WARFARIN SODIUM 5 MG PO TABS: ORAL_TABLET | ORAL | 0 refills | Status: DC

## 2018-10-10 NOTE — Telephone Encounter (Signed)
Rx sent 

## 2018-10-10 NOTE — Telephone Encounter (Signed)
MEDICATION: Atorvastatin and Coumadin  PHARMACY:  [facility] [address]  IS THIS A 90 DAY SUPPLY : would like a 90 day if possible  IS PATIENT OUT OF MEDICATION: yes  IF NOT; HOW MUCH IS LEFT:   LAST APPOINTMENT DATE: @5 /15/2020  NEXT APPOINTMENT DATE:@Visit  date not found  DO WE HAVE YOUR PERMISSION TO LEAVE A DETAILED MESSAGE: YES [phone]  OTHER COMMENTS:    **Let patient know to contact pharmacy at the end of the day to make sure medication is ready. **  ** Please notify patient to allow 48-72 hours to process**  **Encourage patient to contact the pharmacy for refills or they can request refills through [facility]**

## 2018-10-18 ENCOUNTER — Other Ambulatory Visit: Payer: Self-pay

## 2018-10-18 ENCOUNTER — Ambulatory Visit (INDEPENDENT_AMBULATORY_CARE_PROVIDER_SITE_OTHER): Payer: Medicare Other | Admitting: General Practice

## 2018-10-18 DIAGNOSIS — Z7901 Long term (current) use of anticoagulants: Secondary | ICD-10-CM

## 2018-10-18 LAB — POCT INR: INR: 3 (ref 2.0–3.0)

## 2018-10-18 NOTE — Patient Instructions (Addendum)
Pre visit review using our clinic review tool, if applicable. No additional management support is needed unless otherwise documented below in the visit note.  Continue to take 1 tablet daily.  Re-check in 6 weeks.  

## 2018-11-14 ENCOUNTER — Other Ambulatory Visit: Payer: Self-pay

## 2018-11-14 ENCOUNTER — Telehealth: Payer: Self-pay | Admitting: Endocrinology

## 2018-11-14 MED ORDER — GLIMEPIRIDE 2 MG PO TABS: ORAL_TABLET | ORAL | 3 refills | Status: DC

## 2018-11-14 NOTE — Telephone Encounter (Signed)
Pt was told by pharmacy that they did not have any coumadin because they have stopped making it. Pt gets INR done on 11/29/2018 at Coumadin clinic and cannot have other labs there. Pt would like to know if she can have diabetic labs done on same day as appt? She made an appt for this Thursday at 0900.

## 2018-11-14 NOTE — Telephone Encounter (Signed)
Pt also needs glimiperide to [facility] on [address]

## 2018-11-14 NOTE — Telephone Encounter (Signed)
Pt states the drug store told her that coumadin is no longer being made what can she do to replace this med? She is on her last pill

## 2018-11-14 NOTE — Telephone Encounter (Signed)
Glimepiride was sent. Please advise on Coumadin.

## 2018-11-14 NOTE — Telephone Encounter (Signed)
She needs to make follow-up appointment.  Coumadin can be refilled but confirmed that she has been going to get her INR checked.  She can get her diabetes labs done when she goes for her next INR

## 2018-11-17 ENCOUNTER — Encounter: Payer: Medicare Other | Admitting: Endocrinology

## 2018-11-17 ENCOUNTER — Other Ambulatory Visit: Payer: Self-pay

## 2018-11-17 ENCOUNTER — Encounter: Payer: Self-pay | Admitting: Endocrinology

## 2018-11-17 MED ORDER — WARFARIN SODIUM 5 MG PO TABS: 5.0000 mg | ORAL_TABLET | Freq: Every day | ORAL | 3 refills | Status: DC

## 2018-11-17 NOTE — Telephone Encounter (Signed)
Patient called in concern to her running out of her Coumadin and has not been able to take it for 2 days.  Please Advise, Thanks

## 2018-11-17 NOTE — Progress Notes (Signed)
This encounter was created in error - please disregard.

## 2018-11-17 NOTE — Telephone Encounter (Signed)
Called pt's pharmacy and they stated that name brand Coumadin is no longer being made but Warfarin is available. Dr. Dwyane Dee gave approval to send new Rx for generic and pt was called and informed of this.

## 2018-11-29 ENCOUNTER — Ambulatory Visit (INDEPENDENT_AMBULATORY_CARE_PROVIDER_SITE_OTHER): Payer: Medicare Other | Admitting: General Practice

## 2018-11-29 ENCOUNTER — Ambulatory Visit: Payer: Medicare Other

## 2018-11-29 ENCOUNTER — Other Ambulatory Visit: Payer: Self-pay

## 2018-11-29 DIAGNOSIS — I4891 Unspecified atrial fibrillation: Secondary | ICD-10-CM

## 2018-11-29 DIAGNOSIS — Z7901 Long term (current) use of anticoagulants: Secondary | ICD-10-CM | POA: Diagnosis not present

## 2018-11-29 LAB — POCT INR: INR: 4.2 — AB (ref 2.0–3.0)

## 2018-11-29 NOTE — Patient Instructions (Addendum)
Pre visit review using our clinic review tool, if applicable. No additional management support is needed unless otherwise documented below in the visit note.  Skip coumadin today and tomorrow (8/11 and 8/12).  On Thursday start taking 1 tablet daily except 1/2 tablet on Mondays and Fridays.  Re-checks in 3 weeks.

## 2018-12-20 ENCOUNTER — Other Ambulatory Visit: Payer: Self-pay

## 2018-12-20 ENCOUNTER — Ambulatory Visit (INDEPENDENT_AMBULATORY_CARE_PROVIDER_SITE_OTHER): Payer: Medicare Other | Admitting: General Practice

## 2018-12-20 DIAGNOSIS — Z7901 Long term (current) use of anticoagulants: Secondary | ICD-10-CM | POA: Diagnosis not present

## 2018-12-20 DIAGNOSIS — I4891 Unspecified atrial fibrillation: Secondary | ICD-10-CM | POA: Diagnosis not present

## 2018-12-20 LAB — POCT INR: INR: 2 (ref 2.0–3.0)

## 2018-12-20 NOTE — Patient Instructions (Addendum)
Pre visit review using our clinic review tool, if applicable. No additional management support is needed unless otherwise documented below in the visit note.  Continue to take 1 tablet daily except 1/2 tablet on Mondays and Fridays.  Re-checks in 4 weeks.

## 2019-01-16 ENCOUNTER — Other Ambulatory Visit: Payer: Self-pay

## 2019-01-16 ENCOUNTER — Telehealth: Payer: Self-pay | Admitting: Endocrinology

## 2019-01-16 MED ORDER — JANUMET 50-1000 MG PO TABS: 1.0000 | ORAL_TABLET | Freq: Two times a day (BID) | ORAL | 0 refills | Status: DC

## 2019-01-16 MED ORDER — BISOPROLOL-HYDROCHLOROTHIAZIDE 10-6.25 MG PO TABS: 1.0000 | ORAL_TABLET | Freq: Every day | ORAL | 0 refills | Status: DC

## 2019-01-16 NOTE — Telephone Encounter (Signed)
MEDICATION: bisoprolol-hydrochlorothiazide (ZIAC) 10-6.25 MG tablet  sitaGLIPtin-metformin (JANUMET) 50-1000 MG tablet  PHARMACY:  [facility] [phone] - [place], [place] - [place] A 90 DAY SUPPLY : Yes  IS PATIENT OUT OF MEDICATION: Yes  IF NOT; HOW MUCH IS LEFT:   LAST APPOINTMENT DATE: @7 /30/2020  NEXT APPOINTMENT DATE:@Visit  date not found  DO WE HAVE YOUR PERMISSION TO LEAVE A DETAILED MESSAGE: No voicemail  OTHER COMMENTS:  Patient states the pharmacy informed her that we tell them she is not our patient.  **Let patient know to contact pharmacy at the end of the day to make sure medication is ready. **  ** Please notify patient to allow 48-72 hours to process**  **Encourage patient to contact the pharmacy for refills or they can request refills through [facility]**

## 2019-01-16 NOTE — Telephone Encounter (Signed)
Rx sent 

## 2019-01-17 ENCOUNTER — Ambulatory Visit (INDEPENDENT_AMBULATORY_CARE_PROVIDER_SITE_OTHER): Payer: Medicare Other | Admitting: General Practice

## 2019-01-17 ENCOUNTER — Other Ambulatory Visit: Payer: Self-pay

## 2019-01-17 DIAGNOSIS — Z7901 Long term (current) use of anticoagulants: Secondary | ICD-10-CM | POA: Diagnosis not present

## 2019-01-17 DIAGNOSIS — I4891 Unspecified atrial fibrillation: Secondary | ICD-10-CM

## 2019-01-17 LAB — POCT INR: INR: 2.5 (ref 2.0–3.0)

## 2019-01-17 NOTE — Patient Instructions (Addendum)
Pre visit review using our clinic review tool, if applicable. No additional management support is needed unless otherwise documented below in the visit note.  Continue to take 1 tablet daily except 1/2 tablet on Mondays and Fridays.  Re-checks in 4 weeks.

## 2019-01-17 NOTE — Progress Notes (Signed)
Medical screening examination/treatment/procedure(s) were performed by non-physician practitioner and as supervising physician I was immediately available for consultation/collaboration. I agree with above. Aleyah Balik, MD   

## 2019-02-14 ENCOUNTER — Other Ambulatory Visit: Payer: Self-pay

## 2019-02-14 ENCOUNTER — Ambulatory Visit (INDEPENDENT_AMBULATORY_CARE_PROVIDER_SITE_OTHER): Payer: Medicare Other | Admitting: General Practice

## 2019-02-14 DIAGNOSIS — Z7901 Long term (current) use of anticoagulants: Secondary | ICD-10-CM | POA: Diagnosis not present

## 2019-02-14 DIAGNOSIS — I4891 Unspecified atrial fibrillation: Secondary | ICD-10-CM

## 2019-02-14 LAB — POCT INR: INR: 1.3 — AB (ref 2.0–3.0)

## 2019-02-14 NOTE — Progress Notes (Signed)
Medical screening examination/treatment/procedure(s) were performed by non-physician practitioner and as supervising physician I was immediately available for consultation/collaboration. I agree with above. James John, MD   

## 2019-02-14 NOTE — Patient Instructions (Addendum)
Pre visit review using our clinic review tool, if applicable. No additional management support is needed unless otherwise documented below in the visit note.  Take 1 1/2 tablets today, tomorrow and Thursday.  On Friday start taking 1 tablet daily except 1/2 tablet on Mondays only.  Re-check in 1 to 2 weeks.

## 2019-02-17 ENCOUNTER — Other Ambulatory Visit: Payer: Self-pay

## 2019-02-17 ENCOUNTER — Telehealth: Payer: Self-pay | Admitting: Endocrinology

## 2019-02-17 MED ORDER — BISOPROLOL-HYDROCHLOROTHIAZIDE 10-6.25 MG PO TABS: 1.0000 | ORAL_TABLET | Freq: Every day | ORAL | 0 refills | Status: DC

## 2019-02-17 MED ORDER — ATORVASTATIN CALCIUM 10 MG PO TABS: 10.0000 mg | ORAL_TABLET | Freq: Every day | ORAL | 0 refills | Status: DC

## 2019-02-17 MED ORDER — DOXAZOSIN MESYLATE 1 MG PO TABS: 1.0000 mg | ORAL_TABLET | Freq: Every day | ORAL | 0 refills | Status: DC

## 2019-02-17 NOTE — Telephone Encounter (Signed)
Pt scheduled for phone call visit on 02/22/2019. Labs on 02/20/2019 15 day Rx sent to pharmacy.

## 2019-02-17 NOTE — Telephone Encounter (Signed)
MEDICATION: Bisoprolol   Atorvastatin   Doxazosin  PHARMACY:  [facility] on [address]  IS THIS A 90 DAY SUPPLY :   IS PATIENT OUT OF MEDICATION: yes  IF NOT; HOW MUCH IS LEFT:   LAST APPOINTMENT DATE: @9 /28/2020  NEXT APPOINTMENT DATE:@Visit  date not found  DO WE HAVE YOUR PERMISSION TO LEAVE A DETAILED MESSAGE: yes  OTHER COMMENTS:    **Let patient know to contact pharmacy at the end of the day to make sure medication is ready. **  ** Please notify patient to allow 48-72 hours to process**  **Encourage patient to contact the pharmacy for refills or they can request refills through [facility]**

## 2019-02-17 NOTE — Telephone Encounter (Signed)
Refill or deny? 

## 2019-02-17 NOTE — Telephone Encounter (Signed)
Only give 15-day supply with note to make appointment.  Please schedule appointment, can do labs and telephone visit

## 2019-02-20 ENCOUNTER — Other Ambulatory Visit (INDEPENDENT_AMBULATORY_CARE_PROVIDER_SITE_OTHER): Payer: Medicare Other

## 2019-02-20 ENCOUNTER — Other Ambulatory Visit: Payer: Self-pay

## 2019-02-20 DIAGNOSIS — I4821 Permanent atrial fibrillation: Secondary | ICD-10-CM

## 2019-02-20 DIAGNOSIS — E1169 Type 2 diabetes mellitus with other specified complication: Secondary | ICD-10-CM

## 2019-02-20 DIAGNOSIS — I10 Essential (primary) hypertension: Secondary | ICD-10-CM

## 2019-02-20 DIAGNOSIS — E669 Obesity, unspecified: Secondary | ICD-10-CM

## 2019-02-20 LAB — CBC
HCT: 39.8 % (ref 36.0–46.0)
Hemoglobin: 13 g/dL (ref 12.0–15.0)
MCHC: 32.6 g/dL (ref 30.0–36.0)
MCV: 89 fl (ref 78.0–100.0)
Platelets: 219 10*3/uL (ref 150.0–400.0)
RBC: 4.47 Mil/uL (ref 3.87–5.11)
RDW: 14.3 % (ref 11.5–15.5)
WBC: 7.5 10*3/uL (ref 4.0–10.5)

## 2019-02-20 LAB — HEMOGLOBIN A1C: Hgb A1c MFr Bld: 7.3 % — ABNORMAL HIGH (ref 4.6–6.5)

## 2019-02-20 LAB — COMPREHENSIVE METABOLIC PANEL
ALT: 8 U/L (ref 0–35)
AST: 13 U/L (ref 0–37)
Albumin: 4.2 g/dL (ref 3.5–5.2)
Alkaline Phosphatase: 47 U/L (ref 39–117)
BUN: 18 mg/dL (ref 6–23)
CO2: 26 mEq/L (ref 19–32)
Calcium: 9.7 mg/dL (ref 8.4–10.5)
Chloride: 102 mEq/L (ref 96–112)
Creatinine, Ser: 1.14 mg/dL (ref 0.40–1.20)
GFR: 44.7 mL/min — ABNORMAL LOW (ref >60.00)
Glucose, Bld: 103 mg/dL — ABNORMAL HIGH (ref 70–99)
Potassium: 4.1 mEq/L (ref 3.5–5.1)
Sodium: 138 mEq/L (ref 135–145)
Total Bilirubin: 1 mg/dL (ref 0.2–1.2)
Total Protein: 7.5 g/dL (ref 6.0–8.3)

## 2019-02-20 LAB — TSH: TSH: 1.69 u[IU]/mL (ref 0.35–4.50)

## 2019-02-22 ENCOUNTER — Encounter: Payer: Self-pay | Admitting: Endocrinology

## 2019-02-22 ENCOUNTER — Encounter: Payer: Medicare Other | Admitting: Endocrinology

## 2019-02-22 ENCOUNTER — Other Ambulatory Visit: Payer: Self-pay

## 2019-02-23 NOTE — Progress Notes (Signed)
This encounter was created in error - please disregard.

## 2019-02-24 ENCOUNTER — Encounter: Payer: Self-pay | Admitting: Endocrinology

## 2019-02-24 ENCOUNTER — Other Ambulatory Visit: Payer: Self-pay

## 2019-02-24 ENCOUNTER — Ambulatory Visit (INDEPENDENT_AMBULATORY_CARE_PROVIDER_SITE_OTHER): Payer: Medicare Other | Admitting: Endocrinology

## 2019-02-24 VITALS — BP 130/80 | HR 84 | Ht 67.0 in | Wt 210.2 lb

## 2019-02-24 DIAGNOSIS — Z23 Encounter for immunization: Secondary | ICD-10-CM | POA: Diagnosis not present

## 2019-02-24 DIAGNOSIS — E559 Vitamin D deficiency, unspecified: Secondary | ICD-10-CM

## 2019-02-24 DIAGNOSIS — I4821 Permanent atrial fibrillation: Secondary | ICD-10-CM | POA: Diagnosis not present

## 2019-02-24 DIAGNOSIS — E669 Obesity, unspecified: Secondary | ICD-10-CM

## 2019-02-24 DIAGNOSIS — R600 Localized edema: Secondary | ICD-10-CM | POA: Diagnosis not present

## 2019-02-24 DIAGNOSIS — E7800 Pure hypercholesterolemia, unspecified: Secondary | ICD-10-CM

## 2019-02-24 DIAGNOSIS — E1169 Type 2 diabetes mellitus with other specified complication: Secondary | ICD-10-CM

## 2019-02-24 DIAGNOSIS — I10 Essential (primary) hypertension: Secondary | ICD-10-CM

## 2019-02-24 LAB — GLUCOSE, POCT (MANUAL RESULT ENTRY): POC Glucose: 106 mg/dl — AB (ref 70–99)

## 2019-02-24 MED ORDER — RALOXIFENE HCL 60 MG PO TABS: 60.0000 mg | ORAL_TABLET | Freq: Every day | ORAL | 3 refills | Status: DC

## 2019-02-24 MED ORDER — INDAPAMIDE 1.25 MG PO TABS: 1.2500 mg | ORAL_TABLET | Freq: Every day | ORAL | 3 refills | Status: DC

## 2019-02-24 MED ORDER — ERGOCALCIFEROL 1.25 MG (50000 UT) PO CAPS: 50000.0000 [IU] | ORAL_CAPSULE | ORAL | 3 refills | Status: DC

## 2019-02-24 NOTE — Progress Notes (Signed)
Susan Potter 85 y.o.            Reason for Appointment:   followup of various issues   History of Present Illness     HYPERTENSION:   Blood pressure is controlled on 10 mg Ziac  She was on 5 mg amlodipine and this was changed to doxazosin 1 mg in June 2019 because of leg edema especially on the left She has not been seen in follow-up since 11/19  Blood pressure is again well controlled  Does not monitor blood pressure at home   BP Readings from Last 3 Encounters:  02/24/19 130/80  03/10/18 132/76  11/30/17 132/72     Type 2 DIABETES MELITUS, date of diagnosis: 2002    Current oral hypoglycemic regimen:.  Amaryl 2 mg, 1.5 tablets at dinner, Janumet 50/1000, 1 tablet twice a day  Her last A1c was 7.5 and now 7.3  Current management and problems:  She appears to be gradually losing weight  She is not complaining of decreased appetite but likely eating smaller portions  She does not like to check her blood sugar at all at home as before  She is compliant with her Janumet twice a day and Amaryl in the evening as above  Blood sugar today and also in the fasting lab have been near 100   Side effects from medications: None  Glucometer: One Touch.   Blood Glucose readings: none  Compliance with diet: Avoiding sweets but is still eating more fruits like grapes, cereal in the morning, getting an egg in am for breakfast sometimes,  Physical activity: Unable to do much because of back pain  Diabetes complications: None  Wt Readings from Last 3 Encounters:  02/24/19 210 lb 3.2 oz (95.3 kg)  03/10/18 214 lb (97.1 kg)  11/30/17 218 lb 6.4 oz (99.1 kg)    Lab Results  Component Value Date   HGBA1C 7.3 (H) 02/20/2019   HGBA1C 7.5 (H) 03/07/2018   HGBA1C 7.7 (H) 10/12/2017   Lab Results  Component Value Date   MICROALBUR 1.1 10/12/2017   LDLCALC 68 03/07/2018   CREATININE 1.14 02/20/2019      HYPERLIPIDEMIA: Has had good control, she has been taking  Lipitor 10 mg as before   Lab Results  Component Value Date   CHOL 130 03/07/2018   HDL 42.50 03/07/2018   LDLCALC 68 03/07/2018   LDLDIRECT 84.6 01/08/2014   TRIG 96.0 03/07/2018   CHOLHDL 3 03/07/2018    Other active issues: See review of systems     Allergies as of 02/24/2019   No Known Allergies     Medication List       Accurate as of February 24, 2019  9:16 AM. If you have any questions, ask your nurse or doctor.        amLODipine 5 MG tablet Commonly known as: NORVASC Take 1 tablet (5 mg total) by mouth daily.   atorvastatin 10 MG tablet Commonly known as: LIPITOR Take 1 tablet (10 mg total) by mouth daily.   bisoprolol-hydrochlorothiazide 10-6.25 MG tablet Commonly known as: ZIAC Take 1 tablet by mouth daily.   doxazosin 1 MG tablet Commonly known as: CARDURA Take 1 tablet (1 mg total) by mouth daily.   glimepiride 2 MG tablet Commonly known as: AMARYL take 1.5 tablets by mouth once daily AT DINNER TIME   glucose blood test strip One touch Ultra 2 test strips only   Janumet 50-1000 MG tablet Generic drug: sitaGLIPtin-metformin Take 1 tablet  by mouth 2 (two) times daily with a meal.   ONE TOUCH ULTRA SYSTEM KIT w/Device Kit One touch Ultra 2 Glucometer   OVER THE COUNTER MEDICATION Place 1 drop into both eyes daily as needed (dry eyes/itching).   raloxifene 60 MG tablet Commonly known as: EVISTA Take 60 mg by mouth daily.   Vitamin D (Ergocalciferol) 1.25 MG (50000 UT) Caps capsule Commonly known as: DRISDOL take 1 capsule by mouth every week ON SUNDAYS   warfarin 5 MG tablet Commonly known as: COUMADIN Take as directed by the anticoagulation clinic. If you are unsure how to take this medication, talk to your nurse or doctor. Original instructions: Take 1 tablet (5 mg total) by mouth daily. Take 1 tablet by mouth once daily. May give patient generic brand.       Allergies: No Known Allergies  Past Medical History:  Diagnosis Date  .  Atrial fibrillation ([place])   . Diabetes mellitus without complication ([place])   . Hypertension   . Osteoporosis   . Retinal vein occlusion     History reviewed. No pertinent surgical history.  Family History  Problem Relation Age of Onset  . Heart disease Brother     Social History:  reports that she has never smoked. She has never used smokeless tobacco. She reports that she does not drink alcohol. No history on file for drug.  Review of Systems -     Osteoporosis: On Evista Long-term with no side effects previously Apparently she has not renewed her prescription and not clear how long she has been off of it    She has been taking her vitamin D 50,000 units weekly long-term but she has run out of the prescription a few weeks ago and did not call for refill In 2019 her last level was normal  Renal insufficiency: Mild and now improved further  Lab Results  Component Value Date   CREATININE 1.14 02/20/2019   CREATININE 1.20 03/07/2018   CREATININE 1.32 (H) 11/30/2017     Atrial fibrillation: Heart rate has been controlled, continues to be on Coumadin  No complaints of shortness of breath on exertion:  PREVENTIVE care: She has refused to have complete physical exam or schedule mammogram She is due for Pneumovax booster    Examination:   BP 130/80 (BP Location: Left Arm, Patient Position: Sitting, Cuff Size: Normal)   Pulse 84   Ht _0  (1.702 m)   Wt 210 lb 3.2 oz (95.3 kg)   SpO2 98%   BMI 32.92 kg/m   Body mass index is 32.92 kg/m.   Heart rate slightly irregular and about 76 No abnormal heart sounds No tremor 2+ pedal edema present  Diabetic Foot Exam - Simple   Simple Foot Form Diabetic Foot exam was performed with the following findings: Yes   Visual Inspection No deformities, no ulcerations, no other skin breakdown bilaterally: Yes Sensation Testing Intact to touch and monofilament testing bilaterally: Yes Pulse Check See comments: Yes Comments  Absent pedal pulses, posterior tibialis difficult to palpate because of edema         Assesment/Plan:   1. Diabetes type 2, with  obesity   See history of present illness for discussion of current diabetes management, blood sugar patterns and problems identified  Her A1c is slightly better at 7.3 compared to last year  She has good control with lab glucose near 100 No hypoglycemia reported with Amaryl at home As before she does not check sugars at home  Her weight is slowly decreasing  Considering her age her blood sugar control is excellent She can continue the same regimen including Janumet since renal function normal  Needs more regular follow-up   2.  Atrial fibrillation: Heart rate controlled with beta-blocker she has a controlled rate  3.  LEG edema: She appears to have much more leg edema now and not clear of the etiology, likely venous insufficiency since she previously had edema possibly from amlodipine which she is reportedly not taking  Advised her to start using elastic stockings from medical supply store Also will start her on INDAPAMIDE 1.25 mg daily   4  HYPERTENSION: Blood pressure is under control with doxazosin and Ziac To change to indapamide instead of doxazosin because of edema as above  5.  Lipids: LDL has been controlled and she will continue Lipitor, to have lipids on next visit  6.  Coumadin dose will be adjusted at the clinic again  7.  Osteoporosis: She will restart Evista For now we will have her take her 50,000 units vitamin D every 2 weeks instead of weekly and have a follow-up level on her next visit  Pneumovax booster given High-dose influenza vaccine given  Total visit time for evaluation and management of multiple problems and counseling =25 minutes  Elayne Snare 02/24/2019, 9:16 AM

## 2019-02-24 NOTE — Patient Instructions (Addendum)
Stop taking DOXAZOSIN New medication for blood pressure with the indapamide 1.25 mg daily  Restart the raloxifene 60 mg daily  Restart vitamin D, 50,000 units every 15 days  Please get above knee elastic stockings for the swelling on your legs and wear them during the day

## 2019-02-28 ENCOUNTER — Ambulatory Visit (INDEPENDENT_AMBULATORY_CARE_PROVIDER_SITE_OTHER): Payer: Medicare Other | Admitting: General Practice

## 2019-02-28 ENCOUNTER — Other Ambulatory Visit: Payer: Self-pay

## 2019-02-28 DIAGNOSIS — I4891 Unspecified atrial fibrillation: Secondary | ICD-10-CM

## 2019-02-28 DIAGNOSIS — Z7901 Long term (current) use of anticoagulants: Secondary | ICD-10-CM

## 2019-02-28 LAB — POCT INR: INR: 2 (ref 2.0–3.0)

## 2019-02-28 NOTE — Patient Instructions (Addendum)
Pre visit review using our clinic review tool, if applicable. No additional management support is needed unless otherwise documented below in the visit note.  Continue taking 1 tablet daily except 1/2 tablet on Mondays only.  Re-check in 4 weeks. 

## 2019-02-28 NOTE — Progress Notes (Signed)
Medical screening examination/treatment/procedure(s) were performed by non-physician practitioner and as supervising physician I was immediately available for consultation/collaboration. I agree with above. James John, MD   

## 2019-03-14 ENCOUNTER — Other Ambulatory Visit: Payer: Self-pay

## 2019-03-14 ENCOUNTER — Telehealth: Payer: Self-pay

## 2019-03-14 MED ORDER — JANUMET 50-1000 MG PO TABS: 1.0000 | ORAL_TABLET | Freq: Two times a day (BID) | ORAL | 1 refills | Status: DC

## 2019-03-14 MED ORDER — BISOPROLOL-HYDROCHLOROTHIAZIDE 10-6.25 MG PO TABS: 1.0000 | ORAL_TABLET | Freq: Every day | ORAL | 1 refills | Status: DC

## 2019-03-14 MED ORDER — ATORVASTATIN CALCIUM 10 MG PO TABS: 10.0000 mg | ORAL_TABLET | Freq: Every day | ORAL | 1 refills | Status: AC

## 2019-03-14 NOTE — Telephone Encounter (Signed)
Rx sent 

## 2019-03-14 NOTE — Telephone Encounter (Signed)
MEDICATION: bisoprolol-hydrochlorothiazide (ZIAC) 10-6.25 MG tablet   atorvastatin (LIPITOR) 10 MG tablet  sitaGLIPtin-metformin (JANUMET) 50-1000 MG tablet   PHARMACY:      [facility] [phone] - [place], [place] - [place] A 90 DAY SUPPLY : yes   IS PATIENT OUT OF MEDICATION:   IF NOT; HOW MUCH IS LEFT:   LAST APPOINTMENT DATE: @11 /09/2018  NEXT APPOINTMENT DATE:@12 /23/2020  DO WE HAVE YOUR PERMISSION TO LEAVE A DETAILED MESSAGE:  OTHER COMMENTS:    **Let patient know to contact pharmacy at the end of the day to make sure medication is ready. **  ** Please notify patient to allow 48-72 hours to process**  **Encourage patient to contact the pharmacy for refills or they can request refills through [facility]**

## 2019-03-28 ENCOUNTER — Other Ambulatory Visit: Payer: Self-pay

## 2019-03-28 ENCOUNTER — Ambulatory Visit (INDEPENDENT_AMBULATORY_CARE_PROVIDER_SITE_OTHER): Payer: Medicare Other | Admitting: General Practice

## 2019-03-28 DIAGNOSIS — I4891 Unspecified atrial fibrillation: Secondary | ICD-10-CM

## 2019-03-28 DIAGNOSIS — Z7901 Long term (current) use of anticoagulants: Secondary | ICD-10-CM

## 2019-03-28 LAB — POCT INR: INR: 3 (ref 2.0–3.0)

## 2019-03-28 NOTE — Progress Notes (Signed)
Medical screening examination/treatment/procedure(s) were performed by non-physician practitioner and as supervising physician I was immediately available for consultation/collaboration. I agree with above. James John, MD   

## 2019-03-28 NOTE — Patient Instructions (Addendum)
Pre visit review using our clinic review tool, if applicable. No additional management support is needed unless otherwise documented below in the visit note.  Continue taking 1 tablet daily except 1/2 tablet on Mondays only.  Re-check in 4 weeks.

## 2019-04-12 ENCOUNTER — Ambulatory Visit: Payer: Medicare Other | Admitting: Endocrinology

## 2019-04-13 ENCOUNTER — Other Ambulatory Visit: Payer: Self-pay | Admitting: Endocrinology

## 2019-04-17 ENCOUNTER — Other Ambulatory Visit: Payer: Self-pay | Admitting: Endocrinology

## 2019-04-17 DIAGNOSIS — Z7901 Long term (current) use of anticoagulants: Secondary | ICD-10-CM

## 2019-04-25 ENCOUNTER — Ambulatory Visit (INDEPENDENT_AMBULATORY_CARE_PROVIDER_SITE_OTHER): Payer: Medicare Other | Admitting: General Practice

## 2019-04-25 ENCOUNTER — Other Ambulatory Visit: Payer: Self-pay

## 2019-04-25 DIAGNOSIS — Z7901 Long term (current) use of anticoagulants: Secondary | ICD-10-CM

## 2019-04-25 LAB — POCT INR: INR: 1.1 — AB (ref 2.0–3.0)

## 2019-04-25 NOTE — Patient Instructions (Addendum)
.  lbpcmh  Take 1 1/2 tablets today, tomorrow and Thursday.  On Friday continue taking 1 tablet daily except 1/2 tablet on Mondays only.  Re-check in 2 to 3 weeks.

## 2019-04-25 NOTE — Progress Notes (Signed)
Medical screening examination/treatment/procedure(s) were performed by non-physician practitioner and as supervising physician I was immediately available for consultation/collaboration. I agree with above. Mckinley Adelstein, MD   

## 2019-05-12 ENCOUNTER — Ambulatory Visit: Payer: Medicare Other | Attending: Internal Medicine

## 2019-05-12 DIAGNOSIS — Z23 Encounter for immunization: Secondary | ICD-10-CM

## 2019-05-12 NOTE — Progress Notes (Signed)
   Covid-19 Vaccination Clinic  Name:  Chabeli Barsamian    MRN: [id number] DOB: 15-Jun-1926  05/12/2019  Ms. Kuehl was observed post Covid-19 immunization for 15 minutes without incidence. She was provided with Vaccine Information Sheet and instruction to access the V-Safe system.   Ms. Cozine was instructed to call 911 with any severe reactions post vaccine: Marland Kitchen Difficulty breathing  . Swelling of your face and throat  . A fast heartbeat  . A bad rash all over your body  . Dizziness and weakness    Immunizations Administered    Name Date Dose VIS Date Route   Pfizer COVID-19 Vaccine 05/12/2019  9:09 AM 0.3 mL 03/31/2019 Intramuscular   Manufacturer: [employer], [employer]   Lot: [id number]   NDC: [address]-4

## 2019-05-16 ENCOUNTER — Ambulatory Visit (INDEPENDENT_AMBULATORY_CARE_PROVIDER_SITE_OTHER): Payer: Medicare Other | Admitting: General Practice

## 2019-05-16 ENCOUNTER — Other Ambulatory Visit: Payer: Self-pay

## 2019-05-16 DIAGNOSIS — I4891 Unspecified atrial fibrillation: Secondary | ICD-10-CM

## 2019-05-16 DIAGNOSIS — Z7901 Long term (current) use of anticoagulants: Secondary | ICD-10-CM

## 2019-05-16 LAB — POCT INR: INR: 1.6 — AB (ref 2.0–3.0)

## 2019-05-16 NOTE — Patient Instructions (Signed)
Pre visit review using our clinic review tool, if applicable. No additional management support is needed unless otherwise documented below in the visit note.  Take 1 1/2 tablets today, tomorrow and then change dosage and take 1 tablet daily.  Re-check in 3 weeks.

## 2019-06-02 ENCOUNTER — Ambulatory Visit: Payer: Medicare Other | Attending: Internal Medicine

## 2019-06-02 DIAGNOSIS — Z23 Encounter for immunization: Secondary | ICD-10-CM

## 2019-06-02 NOTE — Progress Notes (Signed)
   Covid-19 Vaccination Clinic  Name:  Ree Alcalde    MRN: [id number] DOB: 1927/02/22  06/02/2019  Ms. Wiegel was observed post Covid-19 immunization for 15 minutes without incidence. She was provided with Vaccine Information Sheet and instruction to access the V-Safe system.   Ms. Nanninga was instructed to call 911 with any severe reactions post vaccine: Marland Kitchen Difficulty breathing  . Swelling of your face and throat  . A fast heartbeat  . A bad rash all over your body  . Dizziness and weakness    Immunizations Administered    Name Date Dose VIS Date Route   Pfizer COVID-19 Vaccine 06/02/2019  9:45 AM 0.3 mL 03/31/2019 Intramuscular   Manufacturer: [employer], [employer]   Lot: [id number]   NDC: [address]-7

## 2019-06-06 ENCOUNTER — Ambulatory Visit (INDEPENDENT_AMBULATORY_CARE_PROVIDER_SITE_OTHER): Payer: Medicare Other | Admitting: General Practice

## 2019-06-06 ENCOUNTER — Other Ambulatory Visit: Payer: Self-pay

## 2019-06-06 DIAGNOSIS — Z7901 Long term (current) use of anticoagulants: Secondary | ICD-10-CM | POA: Diagnosis not present

## 2019-06-06 DIAGNOSIS — I4891 Unspecified atrial fibrillation: Secondary | ICD-10-CM

## 2019-06-06 LAB — POCT INR: INR: 2.7 (ref 2.0–3.0)

## 2019-06-06 NOTE — Patient Instructions (Addendum)
Pre visit review using our clinic review tool, if applicable. No additional management support is needed unless otherwise documented below in the visit note.  Continue to take 1 tablet daily.  Re-check in 4 weeks.  

## 2019-06-06 NOTE — Progress Notes (Signed)
Medical screening examination/treatment/procedure(s) were performed by non-physician practitioner and as supervising physician I was immediately available for consultation/collaboration. I agree with above. Hailie Searight, MD   

## 2019-06-21 ENCOUNTER — Other Ambulatory Visit: Payer: Self-pay

## 2019-06-21 MED ORDER — INDAPAMIDE 1.25 MG PO TABS: 1.2500 mg | ORAL_TABLET | Freq: Every day | ORAL | 3 refills | Status: DC

## 2019-06-29 ENCOUNTER — Other Ambulatory Visit: Payer: Self-pay

## 2019-06-29 MED ORDER — ERGOCALCIFEROL 1.25 MG (50000 UT) PO CAPS: 50000.0000 [IU] | ORAL_CAPSULE | ORAL | 3 refills | Status: AC

## 2019-06-29 MED ORDER — RALOXIFENE HCL 60 MG PO TABS: 60.0000 mg | ORAL_TABLET | Freq: Every day | ORAL | 3 refills | Status: AC

## 2019-07-04 ENCOUNTER — Other Ambulatory Visit: Payer: Self-pay

## 2019-07-04 ENCOUNTER — Ambulatory Visit (INDEPENDENT_AMBULATORY_CARE_PROVIDER_SITE_OTHER): Payer: Medicare Other | Admitting: General Practice

## 2019-07-04 DIAGNOSIS — Z7901 Long term (current) use of anticoagulants: Secondary | ICD-10-CM

## 2019-07-04 DIAGNOSIS — I4891 Unspecified atrial fibrillation: Secondary | ICD-10-CM | POA: Diagnosis not present

## 2019-07-04 LAB — POCT INR: INR: 2.6 (ref 2.0–3.0)

## 2019-07-04 NOTE — Patient Instructions (Addendum)
Pre visit review using our clinic review tool, if applicable. No additional management support is needed unless otherwise documented below in the visit note.  Continue to take 1 tablet daily.  Re-check in 6 weeks.  

## 2019-07-04 NOTE — Progress Notes (Signed)
Medical screening examination/treatment/procedure(s) were performed by non-physician practitioner and as supervising physician I was immediately available for consultation/collaboration. I agree with above. Margia Wiesen, MD   

## 2019-08-15 ENCOUNTER — Other Ambulatory Visit: Payer: Self-pay

## 2019-08-15 ENCOUNTER — Ambulatory Visit (INDEPENDENT_AMBULATORY_CARE_PROVIDER_SITE_OTHER): Payer: Medicare Other | Admitting: General Practice

## 2019-08-15 DIAGNOSIS — Z7901 Long term (current) use of anticoagulants: Secondary | ICD-10-CM | POA: Diagnosis not present

## 2019-08-15 LAB — POCT INR: INR: 3.5 — AB (ref 2.0–3.0)

## 2019-08-15 NOTE — Patient Instructions (Addendum)
Pre visit review using our clinic review tool, if applicable. No additional management support is needed unless otherwise documented below in the visit note.  Skip dosage today and then continue to take 1 tablet daily.  Re-check in 4 weeks.  

## 2019-08-29 ENCOUNTER — Encounter (HOSPITAL_COMMUNITY): Payer: Self-pay | Admitting: Internal Medicine

## 2019-08-29 ENCOUNTER — Emergency Department (HOSPITAL_COMMUNITY): Payer: Medicare Other

## 2019-08-29 ENCOUNTER — Inpatient Hospital Stay (HOSPITAL_COMMUNITY)
Admission: EM | Admit: 2019-08-29 | Discharge: 2019-09-04 | DRG: 563 | Disposition: A | Discharge status: Skilled Nursing Facility | Payer: Medicare Other | Attending: Internal Medicine | Admitting: Internal Medicine

## 2019-08-29 ENCOUNTER — Other Ambulatory Visit: Payer: Self-pay

## 2019-08-29 DIAGNOSIS — E1169 Type 2 diabetes mellitus with other specified complication: Secondary | ICD-10-CM | POA: Diagnosis present

## 2019-08-29 DIAGNOSIS — S82101A Unspecified fracture of upper end of right tibia, initial encounter for closed fracture: Secondary | ICD-10-CM

## 2019-08-29 DIAGNOSIS — S0083XA Contusion of other part of head, initial encounter: Secondary | ICD-10-CM | POA: Diagnosis present

## 2019-08-29 DIAGNOSIS — Z20822 Contact with and (suspected) exposure to covid-19: Secondary | ICD-10-CM | POA: Diagnosis present

## 2019-08-29 DIAGNOSIS — R791 Abnormal coagulation profile: Secondary | ICD-10-CM | POA: Diagnosis present

## 2019-08-29 DIAGNOSIS — I10 Essential (primary) hypertension: Secondary | ICD-10-CM | POA: Diagnosis not present

## 2019-08-29 DIAGNOSIS — Z7901 Long term (current) use of anticoagulants: Secondary | ICD-10-CM

## 2019-08-29 DIAGNOSIS — B9620 Unspecified Escherichia coli [E. coli] as the cause of diseases classified elsewhere: Secondary | ICD-10-CM

## 2019-08-29 DIAGNOSIS — T148XXA Other injury of unspecified body region, initial encounter: Secondary | ICD-10-CM | POA: Diagnosis not present

## 2019-08-29 DIAGNOSIS — E782 Mixed hyperlipidemia: Secondary | ICD-10-CM | POA: Diagnosis present

## 2019-08-29 DIAGNOSIS — Y92008 Other place in unspecified non-institutional (private) residence as the place of occurrence of the external cause: Secondary | ICD-10-CM

## 2019-08-29 DIAGNOSIS — E559 Vitamin D deficiency, unspecified: Secondary | ICD-10-CM | POA: Diagnosis present

## 2019-08-29 DIAGNOSIS — Y92009 Unspecified place in unspecified non-institutional (private) residence as the place of occurrence of the external cause: Secondary | ICD-10-CM

## 2019-08-29 DIAGNOSIS — E785 Hyperlipidemia, unspecified: Secondary | ICD-10-CM | POA: Diagnosis present

## 2019-08-29 DIAGNOSIS — I480 Paroxysmal atrial fibrillation: Secondary | ICD-10-CM | POA: Diagnosis present

## 2019-08-29 DIAGNOSIS — E119 Type 2 diabetes mellitus without complications: Secondary | ICD-10-CM

## 2019-08-29 DIAGNOSIS — N390 Urinary tract infection, site not specified: Secondary | ICD-10-CM | POA: Diagnosis present

## 2019-08-29 DIAGNOSIS — I071 Rheumatic tricuspid insufficiency: Secondary | ICD-10-CM | POA: Diagnosis present

## 2019-08-29 DIAGNOSIS — S82141A Displaced bicondylar fracture of right tibia, initial encounter for closed fracture: Secondary | ICD-10-CM | POA: Diagnosis not present

## 2019-08-29 DIAGNOSIS — W19XXXA Unspecified fall, initial encounter: Secondary | ICD-10-CM

## 2019-08-29 DIAGNOSIS — Z7984 Long term (current) use of oral hypoglycemic drugs: Secondary | ICD-10-CM

## 2019-08-29 DIAGNOSIS — M810 Age-related osteoporosis without current pathological fracture: Secondary | ICD-10-CM | POA: Diagnosis present

## 2019-08-29 DIAGNOSIS — W010XXA Fall on same level from slipping, tripping and stumbling without subsequent striking against object, initial encounter: Secondary | ICD-10-CM | POA: Diagnosis present

## 2019-08-29 HISTORY — DX: Paroxysmal atrial fibrillation: I48.0

## 2019-08-29 HISTORY — DX: Type 2 diabetes mellitus without complications: E11.9

## 2019-08-29 HISTORY — DX: Mixed hyperlipidemia: E78.2

## 2019-08-29 HISTORY — DX: Type 2 diabetes mellitus with other specified complication: E11.69

## 2019-08-29 HISTORY — DX: Vitamin D deficiency, unspecified: E55.9

## 2019-08-29 LAB — CBC WITH DIFFERENTIAL/PLATELET
Abs Immature Granulocytes: 0.06 10*3/uL (ref 0.00–0.07)
Basophils Absolute: 0.1 10*3/uL (ref 0.0–0.1)
Basophils Relative: 1 %
Eosinophils Absolute: 0 10*3/uL (ref 0.0–0.5)
Eosinophils Relative: 0 %
HCT: 36.4 % (ref 36.0–46.0)
Hemoglobin: 11.7 g/dL — ABNORMAL LOW (ref 12.0–15.0)
Immature Granulocytes: 0 %
Lymphocytes Relative: 5 %
Lymphs Abs: 0.7 10*3/uL (ref 0.7–4.0)
MCH: 29.6 pg (ref 26.0–34.0)
MCHC: 32.1 g/dL (ref 30.0–36.0)
MCV: 92.2 fL (ref 80.0–100.0)
Monocytes Absolute: 1 10*3/uL (ref 0.1–1.0)
Monocytes Relative: 7 %
Neutro Abs: 12.4 10*3/uL — ABNORMAL HIGH (ref 1.7–7.7)
Neutrophils Relative %: 87 %
Platelets: 251 10*3/uL (ref 150–400)
RBC: 3.95 MIL/uL (ref 3.87–5.11)
RDW: 13.3 % (ref 11.5–15.5)
WBC: 14.3 10*3/uL — ABNORMAL HIGH (ref 4.0–10.5)
nRBC: 0 % (ref 0.0–0.2)

## 2019-08-29 LAB — BASIC METABOLIC PANEL
Anion gap: 11 (ref 5–15)
BUN: 17 mg/dL (ref 8–23)
CO2: 23 mmol/L (ref 22–32)
Calcium: 8.7 mg/dL — ABNORMAL LOW (ref 8.9–10.3)
Chloride: 99 mmol/L (ref 98–111)
Creatinine, Ser: 1.41 mg/dL — ABNORMAL HIGH (ref 0.44–1.00)
GFR calc Af Amer: 38 mL/min — ABNORMAL LOW (ref >60)
GFR calc non Af Amer: 32 mL/min — ABNORMAL LOW (ref >60)
Glucose, Bld: 216 mg/dL — ABNORMAL HIGH (ref 70–99)
Potassium: 4.8 mmol/L (ref 3.5–5.1)
Sodium: 133 mmol/L — ABNORMAL LOW (ref 135–145)

## 2019-08-29 LAB — PROTIME-INR
INR: 2.9 — ABNORMAL HIGH (ref 0.8–1.2)
Prothrombin Time: 29.4 seconds — ABNORMAL HIGH (ref 11.4–15.2)

## 2019-08-29 LAB — SARS CORONAVIRUS 2 BY RT PCR (HOSPITAL ORDER, PERFORMED IN ~~LOC~~ HOSPITAL LAB): SARS Coronavirus 2: NEGATIVE

## 2019-08-29 LAB — CBG MONITORING, ED: Glucose-Capillary: 168 mg/dL — ABNORMAL HIGH (ref 70–99)

## 2019-08-29 MED ORDER — ACETAMINOPHEN 650 MG RE SUPP: 650.0000 mg | Freq: Four times a day (QID) | RECTAL | Status: DC | PRN

## 2019-08-29 MED ORDER — ACETAMINOPHEN 325 MG PO TABS
650.0000 mg | ORAL_TABLET | Freq: Four times a day (QID) | ORAL | Status: DC | PRN
  Administered 2019-08-31 – 2019-09-03 (×9): 650 mg via ORAL
  Filled 2019-08-29 (×9): qty 2

## 2019-08-29 MED ORDER — BISOPROLOL-HYDROCHLOROTHIAZIDE 10-6.25 MG PO TABS
1.0000 | ORAL_TABLET | Freq: Every day | ORAL | Status: DC
  Filled 2019-08-29: qty 1

## 2019-08-29 MED ORDER — INSULIN ASPART 100 UNIT/ML ~~LOC~~ SOLN
0.0000 [IU] | Freq: Three times a day (TID) | SUBCUTANEOUS | Status: DC
  Administered 2019-08-29 – 2019-08-30 (×2): 3 [IU] via SUBCUTANEOUS
  Administered 2019-08-30: 5 [IU] via SUBCUTANEOUS
  Administered 2019-08-30: 3 [IU] via SUBCUTANEOUS
  Administered 2019-08-30: 2 [IU] via SUBCUTANEOUS
  Administered 2019-08-31: 8 [IU] via SUBCUTANEOUS
  Administered 2019-08-31: 5 [IU] via SUBCUTANEOUS
  Administered 2019-08-31 – 2019-09-01 (×3): 3 [IU] via SUBCUTANEOUS
  Administered 2019-09-01: 5 [IU] via SUBCUTANEOUS
  Administered 2019-09-01: 3 [IU] via SUBCUTANEOUS
  Administered 2019-09-02: 2 [IU] via SUBCUTANEOUS
  Administered 2019-09-02 (×2): 3 [IU] via SUBCUTANEOUS
  Administered 2019-09-02: 5 [IU] via SUBCUTANEOUS
  Administered 2019-09-03: 3 [IU] via SUBCUTANEOUS
  Administered 2019-09-03: 8 [IU] via SUBCUTANEOUS
  Administered 2019-09-03 – 2019-09-04 (×3): 3 [IU] via SUBCUTANEOUS

## 2019-08-29 MED ORDER — ONDANSETRON HCL 4 MG PO TABS: 4.0000 mg | ORAL_TABLET | Freq: Four times a day (QID) | ORAL | Status: DC | PRN

## 2019-08-29 MED ORDER — LACTATED RINGERS IV SOLN: INTRAVENOUS | Status: DC

## 2019-08-29 MED ORDER — POLYETHYLENE GLYCOL 3350 17 G PO PACK: 17.0000 g | PACK | Freq: Every day | ORAL | Status: DC | PRN

## 2019-08-29 MED ORDER — ONDANSETRON HCL 4 MG/2ML IJ SOLN: 4.0000 mg | Freq: Four times a day (QID) | INTRAMUSCULAR | Status: DC | PRN

## 2019-08-29 MED ORDER — TRAMADOL HCL 50 MG PO TABS
50.0000 mg | ORAL_TABLET | Freq: Four times a day (QID) | ORAL | Status: DC | PRN
  Administered 2019-09-02: 50 mg via ORAL
  Filled 2019-08-29: qty 1

## 2019-08-29 MED ORDER — ATORVASTATIN CALCIUM 10 MG PO TABS
10.0000 mg | ORAL_TABLET | Freq: Every day | ORAL | Status: DC
  Administered 2019-08-30 – 2019-09-04 (×6): 10 mg via ORAL
  Filled 2019-08-29 (×6): qty 1

## 2019-08-29 MED ORDER — VITAMIN K1 10 MG/ML IJ SOLN
2.0000 mg | Freq: Once | INTRAMUSCULAR | Status: AC
  Administered 2019-08-30: 2 mg via SUBCUTANEOUS
  Filled 2019-08-29 (×2): qty 0.2

## 2019-08-29 MED ORDER — TRAMADOL HCL 50 MG PO TABS
100.0000 mg | ORAL_TABLET | Freq: Four times a day (QID) | ORAL | Status: DC | PRN
  Administered 2019-09-03: 100 mg via ORAL
  Filled 2019-08-29: qty 2

## 2019-08-29 NOTE — Progress Notes (Signed)
Orthopedic Tech Progress Note Patient Details:  Susan Potter 04/20/1873 [id number] Didn't need ortho Patient ID: Susan Potter, female   DOB: 04/20/1873, 86 y.o.   MRN: [id number]   Michelle Piper 08/29/2019, 6:27 PM

## 2019-08-29 NOTE — ED Notes (Signed)
Pt CBG OF 168 mg was reported to [place], Charity fundraiser

## 2019-08-29 NOTE — ED Triage Notes (Addendum)
Pt here from home as a level 2 fall on thinners. Pt bent over to pick up a flower pot and fell down, hit head on grass. Bruising above R eyebrow, scrapes to R knee w/ swelling, pt reports unable to move R Leg.   116/86 BP HR 82 RR 22 SpO2 99% RA CBG 220

## 2019-08-29 NOTE — H&P (Signed)
History and Physical    Susan Potter ZOX:[id number] DOB: 1926-08-15 DOA: 08/29/2019  PCP: Dr. [name] Patient coming from: Home   Chief Complaint:  Chief Complaint  Patient presents with  . Fall     HPI:  86 year old female with past medical history of paroxysmal atrial fibrillation, diabetes mellitus type 2, vitamin D deficiency, osteoporosis, hypertension and hyperlipidemia who presents to [facility] emergency department status post fall with right lower extremity pain.  Patient explains that during the day on 5/10 the patient was walking through her backyard, working on the garden.  She bent over to pick up a flower plant and states that she tripped and fell.  She suddenly began to experience moderate to severe pain of the right knee and right lower extremity.  Patient describes his pain as aching quality, moderate to severe in intensity, without radiation and worse with weightbearing or movement of the affected extremity.  Upon further questioning patient denies chest pain, palpitations, lightheadedness or loss of consciousness surrounding this event.  The local fire department came to the patient's house and brought the patient into her home after she declined to be brought into the hospital.  Patient explains that throughout the day her symptoms continue to worsen with worsening right lower extremity pain.  Patient got to the point where she was completely unable to ambulate due to the severity of pain.  As at this point the patient presented to [facility] emergency department for evaluation.  Upon evaluation in the emergency department, imaging of the right lower extremity revealed a right tibial plateau fracture.  This was discussed with Dr. Susa Simmonds with [facility] who recommended keeping the patient n.p.o. after midnight and that him or one of his associates will see the patient in the morning for possible operative intervention.  The hospitalist group  was then called to assess patient for admission the hospital.  Review of Systems: A 10-system review of systems has been performed and all systems are negative with the exception of what is listed in the HPI.    Past Medical History:  Diagnosis Date  . AF (paroxysmal atrial fibrillation) (HCC) 08/29/2019  . Mixed diabetic hyperlipidemia associated with type 2 diabetes mellitus (HCC) 08/29/2019  . Type 2 diabetes mellitus without complication, without long-term current use of insulin (HCC) 08/29/2019  . Vitamin D deficiency     History reviewed. No pertinent surgical history.   reports that she has never smoked. She has never used smokeless tobacco. She reports that she does not drink alcohol or use drugs.  Not on File  Family History  Problem Relation Age of Onset  . Healthy Mother   . Early death Father      Prior to Admission medications   Medication Sig Start Date End Date Taking? Authorizing Provider  atorvastatin (LIPITOR) 10 MG tablet Take 10 mg by mouth daily. 07/31/19   [provider]  bisoprolol-hydrochlorothiazide (ZIAC) 10-6.25 MG tablet Take 1 tablet by mouth daily. 08/04/19   [provider]  glimepiride (AMARYL) 2 MG tablet Take 3 mg by mouth every evening. 08/21/19   [provider]  indapamide (LOZOL) 1.25 MG tablet Take 1.25 mg by mouth daily. 08/04/19   [provider]  JANUMET 50-1000 MG tablet Take 1 tablet by mouth 2 (two) times daily. 08/11/19   [provider]  raloxifene (EVISTA) 60 MG tablet Take 60 mg by mouth daily. 08/04/19   [provider]  Vitamin D, Ergocalciferol, (DRISDOL) 1.25 MG (  50000 UNIT) CAPS capsule Take 1 capsule by mouth every 14 (fourteen) days. 06/29/19   [provider]  warfarin (COUMADIN) 5 MG tablet Take 5 mg by mouth daily. 07/31/19   [provider]    Physical Exam: Vitals:   08/29/19 1930 08/29/19 2017 08/29/19 2030 08/29/19 2100  BP: (!) 104/58 120/61 (!) 115/55  128/62  Pulse: 74 76 71 64  Resp: 11 11 11 13   Temp:      TempSrc:      SpO2: 100% 100% 100% 100%  Weight:      Height:        Constitutional: Acute alert and oriented x3, no associated distress.   Skin: no rashes, no lesions, good skin turgor noted. Eyes: Pupils are equally reactive to light.  No evidence of scleral icterus or conjunctival pallor.  ENMT: Moist mucous membranes noted.  Posterior pharynx clear of any exudate or lesions.   Neck: normal, supple, no masses, no thyromegaly.  No evidence of jugular venous distension.   Respiratory: clear to auscultation bilaterally, no wheezing, no crackles. Normal respiratory effort. No accessory muscle use.  Cardiovascular: Irregularly irregular rate and rhythm.  No murmurs / rubs / gallops.  Bilateral lower extremity pitting edema.  2+ pedal pulses. No carotid bruits.  Chest:   Nontender without crepitus or deformity.   Back:   Nontender without crepitus or deformity. Abdomen: Abdomen is soft and nontender.  No evidence of intra-abdominal masses.  Positive bowel sounds noted in all quadrants.   Musculoskeletal: Significant pain the both passive and active range of motion of the right knee.  Point tenderness noted at the proximal right leg.  Some notable joint effusion noted of the right knee.  Significant distal bilateral lower extremity pitting edema as noted above.   no contractures. Normal muscle tone.  Neurologic: CN 2-12 grossly intact. Sensation intact, strength noted to be 5 out of 5 in all 4 extremities.  Patient is following all commands.  Patient is responsive to verbal stimuli.   Psychiatric: Patient presents as a normal mood with appropriate affect.  Patient seems to possess insight as to theircurrent situation.     Labs on Admission: I have personally reviewed following labs and imaging studies -   CBC: Recent Labs  Lab 08/29/19 1817  WBC 14.3*  NEUTROABS 12.4*  HGB 11.7*  HCT 36.4  MCV 92.2  PLT 944   Basic Metabolic  Panel: Recent Labs  Lab 08/29/19 1817  NA 133*  K 4.8  CL 99  CO2 23  GLUCOSE 216*  BUN 17  CREATININE 1.41*  CALCIUM 8.7*   GFR: Estimated Creatinine Clearance: 31.5 mL/min (A) (by C-G formula based on SCr of 1.41 mg/dL (H)). Liver Function Tests: No results for input(s): AST, ALT, ALKPHOS, BILITOT, PROT, ALBUMIN in the last 168 hours. No results for input(s): LIPASE, AMYLASE in the last 168 hours. No results for input(s): AMMONIA in the last 168 hours. Coagulation Profile: Recent Labs  Lab 08/29/19 1817  INR 2.9*   Cardiac Enzymes: No results for input(s): CKTOTAL, CKMB, CKMBINDEX, TROPONINI in the last 168 hours. BNP (last 3 results) No results for input(s): PROBNP in the last 8760 hours. HbA1C: No results for input(s): HGBA1C in the last 72 hours. CBG: Recent Labs  Lab 08/29/19 2155  GLUCAP 168*   Lipid Profile: No results for input(s): CHOL, HDL, LDLCALC, TRIG, CHOLHDL, LDLDIRECT in the last 72 hours. Thyroid Function Tests: No results for input(s): TSH, T4TOTAL, FREET4, T3FREE, THYROIDAB in  the last 72 hours. Anemia Panel: No results for input(s): VITAMINB12, FOLATE, FERRITIN, TIBC, IRON, RETICCTPCT in the last 72 hours. Urine analysis: No results found for: COLORURINE, APPEARANCEUR, LABSPEC, PHURINE, GLUCOSEU, HGBUR, BILIRUBINUR, KETONESUR, PROTEINUR, UROBILINOGEN, NITRITE, LEUKOCYTESUR  Radiological Exams on Admission - Personally Reviewed: CT Head Wo Contrast  Result Date: 08/29/2019 CLINICAL DATA:  Fall.  On blood thinner. EXAM: CT HEAD WITHOUT CONTRAST TECHNIQUE: Contiguous axial images were obtained from the base of the skull through the vertex without intravenous contrast. COMPARISON:  None. FINDINGS: Brain: Advanced atrophy. Negative for hydrocephalus. Chronic microvascular ischemic changes are present in the white matter. Small chronic lacunar infarction left head of caudate. Negative for acute infarct, hemorrhage, mass Vascular: Atherosclerotic  calcification. Negative for hyperdense vessel Skull: Negative Sinuses/Orbits: Paranasal sinuses clear. No orbital lesion. Cataract extraction on the left Other: None IMPRESSION: Atrophy and chronic microvascular ischemic change in the white matter. No acute intracranial abnormality. Electronically Signed   By: Marlan Palau M.D.   On: 08/29/2019 19:08   CT Cervical Spine Wo Contrast  Result Date: 08/29/2019 CLINICAL DATA:  Neck pain.  Fall. EXAM: CT CERVICAL SPINE WITHOUT CONTRAST TECHNIQUE: Multidetector CT imaging of the cervical spine was performed without intravenous contrast. Multiplanar CT image reconstructions were also generated. COMPARISON:  None. FINDINGS: Alignment: No subluxation Skull base and vertebrae: No acute fracture. No primary bone lesion or focal pathologic process. Soft tissues and spinal canal: No prevertebral fluid or swelling. No visible canal hematoma. Disc levels: Disc space narrowing most pronounced at C5-6. Diffuse bilateral degenerative facet disease. Upper chest: No acute findings Other: None IMPRESSION: Degenerative disc and facet disease as above. No acute bony abnormality. Electronically Signed   By: Charlett Nose M.D.   On: 08/29/2019 19:07   CT Knee Right Wo Contrast  Result Date: 08/29/2019 CLINICAL DATA:  Larey Seat. Tibial plateau fracture. EXAM: CT OF THE right KNEE WITHOUT CONTRAST TECHNIQUE: Multidetector CT imaging of the right knee was performed according to the standard protocol. Multiplanar CT image reconstructions were also generated. COMPARISON:  Right knee radiographs, same date FINDINGS: Tibial plateau fractures are demonstrated. There are fractures on both sides of the tibial spines. The lateral plateau fracture demonstrates minimal depression of 2 mm centrally. The medial tibial plateau fracture begins near the medial spine and runs longitudinally out through the medial metadiaphyseal cortex with slight impaction but no significant displacement. The fibula, femur  and patella are intact. Expected hemarthrosis. The PCL is intact. Difficult to evaluate the ACL. The medial and lateral collateral ligament complexes appear grossly intact by CT. The quadriceps and patellar tendons are intact. IMPRESSION: 1. Tibial plateau fractures as described above. 2. Expected hemarthrosis. 3. Difficult to evaluate the ACL. The PCL and the medial and lateral collateral ligament complexes appear grossly intact by CT. Electronically Signed   By: Rudie Meyer M.D.   On: 08/29/2019 20:02   DG Pelvis Portable  Result Date: 08/29/2019 CLINICAL DATA:  Status post fall from a standing position today. Pelvic pain. Initial encounter. EXAM: PORTABLE PELVIS 1-2 VIEWS COMPARISON:  None. FINDINGS: There is no evidence of pelvic fracture or diastasis. No pelvic bone lesions are seen. IMPRESSION: Negative exam. Electronically Signed   By: Drusilla Kanner M.D.   On: 08/29/2019 18:41   DG Chest Portable 1 View  Result Date: 08/29/2019 CLINICAL DATA:  Chest pain after fall. EXAM: PORTABLE CHEST 1 VIEW COMPARISON:  None. FINDINGS: There is no evidence of acute infiltrate, pleural effusion or pneumothorax. The heart size and mediastinal  contours are within normal limits. There is moderate severity calcification of the aortic arch. A chronic appearing deformity is seen involving the left humeral head. IMPRESSION: No active disease. Electronically Signed   By: Aram Candelahaddeus  Houston M.D.   On: 08/29/2019 18:42   DG Knee Right Port  Result Date: 08/29/2019 CLINICAL DATA:  Right knee pain due to an injury suffered in a fall position today. Initial encounter. EXAM: PORTABLE RIGHT KNEE - 1-2 VIEW COMPARISON:  None. FINDINGS: The patient has a nondisplaced fracture of the proximal tibia. The fracture extends from just peripheral to the lateral tibial eminence in an inferior and medial orientation through the proximal diaphysis of the femur on the medial side. There is no depression of the tibial plateaus. No other  fracture is identified. There is some osteophytosis about the knee although joint spaces are preserved. Small joint effusion is noted. Atherosclerosis is identified. IMPRESSION: Nondisplaced proximal tibial fracture as described above. Electronically Signed   By: Drusilla Kannerhomas  Dalessio M.D.   On: 08/29/2019 18:46    EKG: Personally reviewed.  Rhythm is atrial fibrillation with heart rate of 72 bpm.  No dynamic ST segment changes appreciated.  Assessment/Plan Active Problems:   Fall at home, initial encounter   Patient reports fall at home without any evidence of loss of consciousness.  No obvious evidence based on history of underlying medical condition causing the fall  That being said, patient has a known history of atrial fibrillation and is somewhat of a poor historian and therefore we will monitor patient on telemetry overnight, obtain serial cardiac enzymes and obtain morning echocardiogram  Furthermore, patient is on 2 diuretics at home with simultaneous hydrochlorothiazide and indapamide.  We will at least hold the indapamide and hydrate patient gently overnight.    Tibial plateau fracture, right, closed, initial encounter   Evidence of right tibial plateau fracture secondary to mechanical fall  Case is already been discussed with Dr. Susa SimmondsAdair with orthopedics who stated that he or one of his associates will round on the patient in the morning for possible operative intervention  Per orthopedic recommendations, will make patient n.p.o. overnight  Hydrating patient gently with intravenous isotonic fluids  Obtaining vitamin D level  Providing patient with low-dose analgesics including Tylenol for mild pain and tramadol for moderate to severe pain.    AF (paroxysmal atrial fibrillation) (HCC)   Longstanding history of atrial fibrillation in excess of 30 years  On long-term Coumadin therapy in outpatient setting  Temporarily holding Coumadin in preparation for possible operative  intervention  During somewhat elevated INR, providing patient with single dose of subcutaneous vitamin K to correct INR in preparation for operative intervention  Continuing home regimen of bisoprolol  Monitoring patient on telemetry    Type 2 diabetes mellitus without complication, without long-term current use of insulin (HCC)   Obtaining hemoglobin A1c  Holding home regimen of oral hypoglycemics  Accu-Cheks before every meal and nightly with sliding scale insulin    Essential hypertension   Considering patient is on 2 simultaneous diuretics, holding indapamide  Continue bisoprolol and hydrochlorothiazide    Mixed diabetic hyperlipidemia associated with type 2 diabetes mellitus (HCC)   Continue home regimen of statin therapy      Code Status:  Full code Family Communication: Granddaughter at the bedside and has been updated on plan of care  Status is: Observation  The patient remains OBS appropriate and will d/c before 2 midnights.  Dispo: The patient is from: Home  Anticipated d/c is to: Home              Anticipated d/c date is: 2 days              Patient currently is not medically stable to d/c.        Marinda Elk MD [facility] Pager [phone]  If 7PM-7AM, please contact night-coverage [email or web address] Use universal [place] password for that web site. If you do not have the password, please call the hospital operator.  08/29/2019, 9:58 PM

## 2019-08-29 NOTE — Progress Notes (Signed)
   08/29/19 1817  Clinical Encounter Type  Visited With Health care provider  Visit Type ED;Trauma  Referral From Nurse  Consult/Referral To Chaplain  Advance Directives (For Healthcare)  Does Patient Have a Medical Advance Directive? No  Would patient like information on creating a medical advance directive? No - Patient declined  Mental Health Advance Directives  Does Patient Have a Mental Health Advance Directive? No  Would patient like information on creating a mental health advance directive? No - Patient declined   Chaplain responded to level two page. Pt was being evaluated. No family not present. Chaplain not needed at this time. Chaplain available for support as needs arise.   Chaplain Resident, Amado Coe, M Div [phone] on-call pager

## 2019-08-29 NOTE — ED Provider Notes (Signed)
[facility] EMERGENCY DEPARTMENT Provider Note   CSN: [id number] Arrival date & time: 08/29/19  1809     History Chief Complaint  Patient presents with  . Fall    Susan Potter is a 86 y.o. female brought in by EMS for evaluation mechanical fall that occurred at about 11 AM this morning.  Patient reports that she was outside she bent over to pick up a flower pot and states that when she did so, she became lightheaded and fell forward hitting her head on grass.  She also reports that she hit her knee.  No LOC.  She reports she had to call the fire department to help her get up and into the house.  At that time, she did not want to go to the hospital.  Family came over later to check on her and she was still complaining of pain to her right knee and had not been able to ambulate or bear weight on that knee since the incident.  Patient states she normally ambulates with no assistance.  Additionally, patient had a hematoma to forehead so family called EMS.  Patient reports she has pain to the right knee but denies any other pain.  She states she is on Coumadin.  Her granddaughter thinks she takes it for an irregular heartbeat.  Patient states she has not missed any doses.  She denies any vision changes, neck pain, back pain, chest pain, difficulty breathing, abdominal pain, nausea/vomiting, numbness/weakness.  The history is provided by the patient.       Past Medical History:  Diagnosis Date  . AF (paroxysmal atrial fibrillation) ([place]) 08/29/2019  . Mixed diabetic hyperlipidemia associated with type 2 diabetes mellitus ([place]) 08/29/2019  . Type 2 diabetes mellitus without complication, without long-term current use of insulin ([place]) 08/29/2019  . Vitamin D deficiency     Patient Active Problem List   Diagnosis Date Noted  . AF (paroxysmal atrial fibrillation) ([place]) 08/29/2019  . Type 2 diabetes mellitus without complication, without long-term current use of insulin ([place])  08/29/2019  . Mixed diabetic hyperlipidemia associated with type 2 diabetes mellitus ([place]) 08/29/2019  . Essential hypertension 08/29/2019  . Fall at home, initial encounter 08/29/2019  . Tibial plateau fracture, right, closed, initial encounter 08/29/2019  . Fracture of right tibial plateau, closed, initial encounter 08/29/2019    The histories are not reviewed yet. Please review them in the "History" navigator section and refresh this [place].   OB History   No obstetric history on file.     Family History  Problem Relation Age of Onset  . Healthy Mother   . Early death Father     Social History   Tobacco Use  . Smoking status: Never Smoker  . Smokeless tobacco: Never Used  Substance Use Topics  . Alcohol use: Never  . Drug use: Never    Home Medications Prior to Admission medications   Medication Sig Start Date End Date Taking? Authorizing Provider  atorvastatin (LIPITOR) 10 MG tablet Take 10 mg by mouth daily. 07/31/19   [provider]  bisoprolol-hydrochlorothiazide (ZIAC) 10-6.25 MG tablet Take 1 tablet by mouth daily. 08/04/19   [provider]  glimepiride (AMARYL) 2 MG tablet Take 3 mg by mouth every evening. 08/21/19   [provider]  indapamide (LOZOL) 1.25 MG tablet Take 1.25 mg by mouth daily. 08/04/19   [provider]  JANUMET 50-1000 MG tablet Take 1 tablet by mouth 2 (two) times daily. 08/11/19  [provider]  raloxifene (EVISTA) 60 MG tablet Take 60 mg by mouth daily. 08/04/19   [provider]  Vitamin D, Ergocalciferol, (DRISDOL) 1.25 MG (50000 UNIT) CAPS capsule Take 1 capsule by mouth every 14 (fourteen) days. 06/29/19   [provider]  warfarin (COUMADIN) 5 MG tablet Take 5 mg by mouth daily. 07/31/19   [provider]    Allergies    Patient has no allergy information on record.  Review of Systems   Review of Systems  Constitutional: Negative for fever.  Respiratory:  Negative for cough and shortness of breath.   Cardiovascular: Negative for chest pain.  Gastrointestinal: Negative for abdominal pain, nausea and vomiting.  Genitourinary: Negative for dysuria and hematuria.  Musculoskeletal:       Leg pain  Neurological: Negative for headaches.  All other systems reviewed and are negative.   Physical Exam Updated Vital Signs BP 128/62   Pulse 64   Temp (!) 97.3 F (36.3 C) (Oral)   Resp 13   Ht 5\' 7"  (1.702 m)   Wt 99.8 kg   SpO2 100%   BMI 34.46 kg/m   Physical Exam Vitals and nursing note reviewed.  Constitutional:      Appearance: Normal appearance. She is well-developed.  HENT:     Head: Normocephalic.      Comments: Hematoma noted to right frontal forehead.  No underlying skull deformity or crepitus noted. Eyes:     General: Lids are normal.     Conjunctiva/sclera: Conjunctivae normal.     Pupils: Pupils are equal, round, and reactive to light.     Comments: PERRL. EOMs intact. No nystagmus. No neglect.   Neck:     Comments: Full flexion/extension and lateral movement of neck fully intact. No bony midline tenderness. No deformities or crepitus.  Cardiovascular:     Rate and Rhythm: Normal rate and regular rhythm.     Pulses:          Radial pulses are 2+ on the right side and 2+ on the left side.       Dorsalis pedis pulses are 1+ on the right side and 1+ on the left side.     Heart sounds: Normal heart sounds. No murmur. No friction rub. No gallop.   Pulmonary:     Effort: Pulmonary effort is normal.     Breath sounds: Normal breath sounds.     Comments: Lungs clear to auscultation bilaterally.  Symmetric chest rise.  No wheezing, rales, rhonchi. Abdominal:     Palpations: Abdomen is soft. Abdomen is not rigid.     Tenderness: There is no abdominal tenderness. There is no guarding.     Comments: Abdomen is soft, non-distended, non-tender. No rigidity, No guarding. No peritoneal signs.  Musculoskeletal:        General:  Normal range of motion.     Cervical back: Full passive range of motion without pain.     Comments: No pelvic instability.  Tenderness palpation of the anterior aspect of right knee with overlying soft tissue swelling and ecchymosis.  Limited range of motion secondary to pain.  Patient with difficulty lifting lower right lower extremity off the table secondary to pain.  No bony tenderness noted right femur, right tib-fib, ankle.  No tenderness palpation of the left lower extremity.  No midline T or L-spine tenderness. No tenderness to palpation to bilateral shoulders, clavicles, elbows, and wrists. No deformities or crepitus noted. FROM of BUE without difficulty.  Skin:    General: Skin is warm and dry.     Capillary Refill: Capillary refill takes less than 2 seconds.     Comments: Scattered abrasions noted to right lower extremity. Good distal cap refill.  RLE is not dusky in appearance or cool to touch.  Neurological:     Mental Status: She is alert and oriented to person, place, and time.     Comments: Cranial nerves III-XII intact Follows commands, Moves all extremities  5/5 strength to BUE and BLE  Sensation intact throughout all major nerve distributions No slurred speech. No facial droop.   Psychiatric:        Speech: Speech normal.     ED Results / Procedures / Treatments   Labs (all labs ordered are listed, but only abnormal results are displayed) Labs Reviewed  PROTIME-INR - Abnormal; Notable for the following components:      Result Value   Prothrombin Time 29.4 (*)    INR 2.9 (*)    All other components within normal limits  BASIC METABOLIC PANEL - Abnormal; Notable for the following components:   Sodium 133 (*)    Glucose, Bld 216 (*)    Creatinine, Ser 1.41 (*)    Calcium 8.7 (*)    GFR calc non Af Amer 32 (*)    GFR calc Af Amer 38 (*)    All other components within normal limits  CBC WITH DIFFERENTIAL/PLATELET - Abnormal; Notable for the following components:    WBC 14.3 (*)    Hemoglobin 11.7 (*)    Neutro Abs 12.4 (*)    All other components within normal limits  CBG MONITORING, ED - Abnormal; Notable for the following components:   Glucose-Capillary 168 (*)    All other components within normal limits  SARS CORONAVIRUS 2 BY RT PCR (HOSPITAL ORDER, PERFORMED IN [facility])  HEMOGLOBIN A1C  PROTIME-INR  APTT    EKG None  Radiology CT Head Wo Contrast  Result Date: 08/29/2019 CLINICAL DATA:  Fall.  On blood thinner. EXAM: CT HEAD WITHOUT CONTRAST TECHNIQUE: Contiguous axial images were obtained from the base of the skull through the vertex without intravenous contrast. COMPARISON:  None. FINDINGS: Brain: Advanced atrophy. Negative for hydrocephalus. Chronic microvascular ischemic changes are present in the white matter. Small chronic lacunar infarction left head of caudate. Negative for acute infarct, hemorrhage, mass Vascular: Atherosclerotic calcification. Negative for hyperdense vessel Skull: Negative Sinuses/Orbits: Paranasal sinuses clear. No orbital lesion. Cataract extraction on the left Other: None IMPRESSION: Atrophy and chronic microvascular ischemic change in the white matter. No acute intracranial abnormality. Electronically Signed   By: Marlan Palau M.D.   On: 08/29/2019 19:08   CT Cervical Spine Wo Contrast  Result Date: 08/29/2019 CLINICAL DATA:  Neck pain.  Fall. EXAM: CT CERVICAL SPINE WITHOUT CONTRAST TECHNIQUE: Multidetector CT imaging of the cervical spine was performed without intravenous contrast. Multiplanar CT image reconstructions were also generated. COMPARISON:  None. FINDINGS: Alignment: No subluxation Skull base and vertebrae: No acute fracture. No primary bone lesion or focal pathologic process. Soft tissues and spinal canal: No prevertebral fluid or swelling. No visible canal hematoma. Disc levels: Disc space narrowing most pronounced at C5-6. Diffuse bilateral degenerative facet disease. Upper chest: No  acute findings Other: None IMPRESSION: Degenerative disc and facet disease as above. No acute bony abnormality. Electronically Signed   By: Charlett Nose M.D.   On: 08/29/2019 19:07   CT Knee Right Wo Contrast  Result Date: 08/29/2019  CLINICAL DATA:  Larey SeatFell. Tibial plateau fracture. EXAM: CT OF THE right KNEE WITHOUT CONTRAST TECHNIQUE: Multidetector CT imaging of the right knee was performed according to the standard protocol. Multiplanar CT image reconstructions were also generated. COMPARISON:  Right knee radiographs, same date FINDINGS: Tibial plateau fractures are demonstrated. There are fractures on both sides of the tibial spines. The lateral plateau fracture demonstrates minimal depression of 2 mm centrally. The medial tibial plateau fracture begins near the medial spine and runs longitudinally out through the medial metadiaphyseal cortex with slight impaction but no significant displacement. The fibula, femur and patella are intact. Expected hemarthrosis. The PCL is intact. Difficult to evaluate the ACL. The medial and lateral collateral ligament complexes appear grossly intact by CT. The quadriceps and patellar tendons are intact. IMPRESSION: 1. Tibial plateau fractures as described above. 2. Expected hemarthrosis. 3. Difficult to evaluate the ACL. The PCL and the medial and lateral collateral ligament complexes appear grossly intact by CT. Electronically Signed   By: Rudie MeyerP.  Gallerani M.D.   On: 08/29/2019 20:02   DG Pelvis Portable  Result Date: 08/29/2019 CLINICAL DATA:  Status post fall from a standing position today. Pelvic pain. Initial encounter. EXAM: PORTABLE PELVIS 1-2 VIEWS COMPARISON:  None. FINDINGS: There is no evidence of pelvic fracture or diastasis. No pelvic bone lesions are seen. IMPRESSION: Negative exam. Electronically Signed   By: Drusilla Kannerhomas  Dalessio M.D.   On: 08/29/2019 18:41   DG Chest Portable 1 View  Result Date: 08/29/2019 CLINICAL DATA:  Chest pain after fall. EXAM: PORTABLE  CHEST 1 VIEW COMPARISON:  None. FINDINGS: There is no evidence of acute infiltrate, pleural effusion or pneumothorax. The heart size and mediastinal contours are within normal limits. There is moderate severity calcification of the aortic arch. A chronic appearing deformity is seen involving the left humeral head. IMPRESSION: No active disease. Electronically Signed   By: Aram Candelahaddeus  Houston M.D.   On: 08/29/2019 18:42   DG Knee Right Port  Result Date: 08/29/2019 CLINICAL DATA:  Right knee pain due to an injury suffered in a fall position today. Initial encounter. EXAM: PORTABLE RIGHT KNEE - 1-2 VIEW COMPARISON:  None. FINDINGS: The patient has a nondisplaced fracture of the proximal tibia. The fracture extends from just peripheral to the lateral tibial eminence in an inferior and medial orientation through the proximal diaphysis of the femur on the medial side. There is no depression of the tibial plateaus. No other fracture is identified. There is some osteophytosis about the knee although joint spaces are preserved. Small joint effusion is noted. Atherosclerosis is identified. IMPRESSION: Nondisplaced proximal tibial fracture as described above. Electronically Signed   By: Drusilla Kannerhomas  Dalessio M.D.   On: 08/29/2019 18:46    Procedures Procedures (including critical care time)  Medications Ordered in ED Medications  insulin aspart (novoLOG) injection 0-15 Units (has no administration in time range)  phytonadione (VITAMIN K) SQ injection 2 mg (has no administration in time range)  lactated ringers infusion (has no administration in time range)    ED Course  I have reviewed the triage vital signs and the nursing notes.  Pertinent labs & imaging results that were available during my care of the patient were reviewed by me and considered in my medical decision making (see chart for details).    MDM Rules/Calculators/A&P                      86 year old female past with history of A. fib (on  Coumadin),  hypertension, diabetes who presents for evaluation of mechanical fall that occurred about 11 AM this morning.  Patient reports she was bending down when she got lightheaded, causing her to fall.  Also reports landing on her knee.  She called the fire department help her back in house but did not want to go to the hospital at that time.  Later on, family checked in on her him patient was having difficulty walking so they called EMS.  On initially arrival, she is afebrile, nontoxic-appearing.  She is alert and oriented x3.  She is able to answer all questions with any difficulty.  Vital signs are stable.  Given that she is on Coumadin, plan for imaging of her head, neck.  Additionally, she has tenderness palpation, ecchymosis noted to the right knee.  She has been unable to bear weight or ambulate on that knee since then.  INR is 2.9.  BMP shows sodium of 133, glucose of 216, BUN of 17, creatinine of 1.41.  CBC shows leukocytosis of 14.3.  Hemoglobin is 11.7.  CT C-spine shows no acute bony abnormality.  CT head shows no acute intracranial abnormality.  Knee x-ray shows nondisplaced proximal tibial fracture.  At this time, patient lives by herself.  Do not feel that it is safe for her to go home and be nonweightbearing on her leg.  Additionally, feel that she needs orthopedic evaluation given that she is independently mobile.  Discussed patient with Dr. Susa Simmonds (Ortho).  He agrees with plan for medical admission.  He he will plan to either have himself or trauma Ortho consult on patient tomorrow morning to see if this is amenable for surgical intervention.  He recommends keeping her n.p.o. after midnight.  Discussed patient with Dr. Leafy Half (hospitalist) who accepts patient for admission.  Portions of this note were generated with Scientist, clinical (histocompatibility and immunogenetics). Dictation errors may occur despite best attempts at proofreading.   Final Clinical Impression(s) / ED Diagnoses Final diagnoses:  Closed  fracture of proximal end of right tibia, unspecified fracture morphology, initial encounter  Hematoma    Rx / DC Orders ED Discharge Orders    None       Rosana Hoes 08/29/19 2209    Milagros Loll, MD 08/30/19 2250

## 2019-08-30 ENCOUNTER — Observation Stay (HOSPITAL_BASED_OUTPATIENT_CLINIC_OR_DEPARTMENT_OTHER): Payer: Medicare Other

## 2019-08-30 DIAGNOSIS — I351 Nonrheumatic aortic (valve) insufficiency: Secondary | ICD-10-CM | POA: Diagnosis not present

## 2019-08-30 DIAGNOSIS — S82141A Displaced bicondylar fracture of right tibia, initial encounter for closed fracture: Secondary | ICD-10-CM | POA: Diagnosis not present

## 2019-08-30 DIAGNOSIS — I480 Paroxysmal atrial fibrillation: Secondary | ICD-10-CM | POA: Diagnosis not present

## 2019-08-30 DIAGNOSIS — N390 Urinary tract infection, site not specified: Secondary | ICD-10-CM | POA: Diagnosis not present

## 2019-08-30 DIAGNOSIS — I340 Nonrheumatic mitral (valve) insufficiency: Secondary | ICD-10-CM | POA: Diagnosis not present

## 2019-08-30 DIAGNOSIS — I10 Essential (primary) hypertension: Secondary | ICD-10-CM | POA: Diagnosis not present

## 2019-08-30 DIAGNOSIS — E559 Vitamin D deficiency, unspecified: Secondary | ICD-10-CM | POA: Diagnosis present

## 2019-08-30 LAB — CBC WITH DIFFERENTIAL/PLATELET
Abs Immature Granulocytes: 0.07 10*3/uL (ref 0.00–0.07)
Basophils Absolute: 0.1 10*3/uL (ref 0.0–0.1)
Basophils Relative: 0 %
Eosinophils Absolute: 0 10*3/uL (ref 0.0–0.5)
Eosinophils Relative: 0 %
HCT: 33.4 % — ABNORMAL LOW (ref 36.0–46.0)
Hemoglobin: 10.9 g/dL — ABNORMAL LOW (ref 12.0–15.0)
Immature Granulocytes: 1 %
Lymphocytes Relative: 6 %
Lymphs Abs: 0.8 10*3/uL (ref 0.7–4.0)
MCH: 29.8 pg (ref 26.0–34.0)
MCHC: 32.6 g/dL (ref 30.0–36.0)
MCV: 91.3 fL (ref 80.0–100.0)
Monocytes Absolute: 1.3 10*3/uL — ABNORMAL HIGH (ref 0.1–1.0)
Monocytes Relative: 11 %
Neutro Abs: 9.8 10*3/uL — ABNORMAL HIGH (ref 1.7–7.7)
Neutrophils Relative %: 82 %
Platelets: 239 10*3/uL (ref 150–400)
RBC: 3.66 MIL/uL — ABNORMAL LOW (ref 3.87–5.11)
RDW: 13.2 % (ref 11.5–15.5)
WBC: 11.9 10*3/uL — ABNORMAL HIGH (ref 4.0–10.5)
nRBC: 0 % (ref 0.0–0.2)

## 2019-08-30 LAB — VITAMIN D 25 HYDROXY (VIT D DEFICIENCY, FRACTURES): Vit D, 25-Hydroxy: 23.61 ng/mL — ABNORMAL LOW (ref 30–100)

## 2019-08-30 LAB — PROTIME-INR
INR: 2.9 — ABNORMAL HIGH (ref 0.8–1.2)
Prothrombin Time: 29.4 seconds — ABNORMAL HIGH (ref 11.4–15.2)

## 2019-08-30 LAB — URINALYSIS, ROUTINE W REFLEX MICROSCOPIC
Bilirubin Urine: NEGATIVE
Glucose, UA: NEGATIVE mg/dL
Ketones, ur: 5 mg/dL — AB
Nitrite: NEGATIVE
Protein, ur: NEGATIVE mg/dL
Specific Gravity, Urine: 1.016 (ref 1.005–1.030)
WBC, UA: >50 WBC/hpf — ABNORMAL HIGH (ref 0–5)
pH: 5 (ref 5.0–8.0)

## 2019-08-30 LAB — ECHOCARDIOGRAM COMPLETE
Height: 67 in
Weight: 3519.99 oz

## 2019-08-30 LAB — CBG MONITORING, ED
Glucose-Capillary: 225 mg/dL — ABNORMAL HIGH (ref 70–99)
Glucose-Capillary: 162 mg/dL — ABNORMAL HIGH (ref 70–99)

## 2019-08-30 LAB — TROPONIN I (HIGH SENSITIVITY): Troponin I (High Sensitivity): 13 ng/L (ref <18)

## 2019-08-30 LAB — HEMOGLOBIN A1C
Hgb A1c MFr Bld: 7.3 % — ABNORMAL HIGH (ref 4.8–5.6)
Mean Plasma Glucose: 162.81 mg/dL

## 2019-08-30 LAB — GLUCOSE, CAPILLARY
Glucose-Capillary: 148 mg/dL — ABNORMAL HIGH (ref 70–99)
Glucose-Capillary: 183 mg/dL — ABNORMAL HIGH (ref 70–99)

## 2019-08-30 LAB — APTT: aPTT: 39 seconds — ABNORMAL HIGH (ref 24–36)

## 2019-08-30 LAB — MAGNESIUM: Magnesium: 1.6 mg/dL — ABNORMAL LOW (ref 1.7–2.4)

## 2019-08-30 MED ORDER — BISOPROLOL FUMARATE 10 MG PO TABS
10.0000 mg | ORAL_TABLET | Freq: Every day | ORAL | Status: DC
  Administered 2019-08-31 – 2019-09-04 (×5): 10 mg via ORAL
  Filled 2019-08-30 (×6): qty 1

## 2019-08-30 MED ORDER — MAGNESIUM SULFATE 2 GM/50ML IV SOLN
2.0000 g | Freq: Once | INTRAVENOUS | Status: AC
  Administered 2019-08-30: 2 g via INTRAVENOUS
  Filled 2019-08-30: qty 50

## 2019-08-30 MED ORDER — SODIUM CHLORIDE 0.9 % IV SOLN
1.0000 g | INTRAVENOUS | Status: DC
  Administered 2019-08-30 – 2019-08-31 (×2): 1 g via INTRAVENOUS
  Filled 2019-08-30 (×2): qty 10

## 2019-08-30 MED ORDER — WARFARIN - PHARMACIST DOSING INPATIENT: Freq: Every day | Status: DC

## 2019-08-30 MED ORDER — WARFARIN SODIUM 5 MG PO TABS
5.0000 mg | ORAL_TABLET | Freq: Once | ORAL | Status: AC
  Administered 2019-08-30: 5 mg via ORAL
  Filled 2019-08-30: qty 1

## 2019-08-30 MED ORDER — CALCIUM CARBONATE-VITAMIN D 500-200 MG-UNIT PO TABS
1.0000 | ORAL_TABLET | Freq: Three times a day (TID) | ORAL | Status: DC
  Administered 2019-08-30 – 2019-09-04 (×15): 1 via ORAL
  Filled 2019-08-30 (×17): qty 1

## 2019-08-30 MED ORDER — HYDROCHLOROTHIAZIDE 10 MG/ML ORAL SUSPENSION
6.2500 mg | Freq: Every day | ORAL | Status: DC
  Administered 2019-08-31: 6.25 mg via ORAL
  Filled 2019-08-30 (×2): qty 1.25

## 2019-08-30 MED ORDER — HYDROCHLOROTHIAZIDE 10 MG/ML ORAL SUSPENSION
6.2500 mg | Freq: Every day | ORAL | Status: DC
  Filled 2019-08-30: qty 1.25

## 2019-08-30 NOTE — Progress Notes (Signed)
Telemetry notified nurse that telemetry monitor was off, upon entering the room patient had thrown telemetry monitor in the floor across the room and didn't know why she had thrown it. Patient knows her name but thinks she is at home., she knows she broke her leg from picking up a pot at home   Vitals stable, face symmetrical and hand grips equal and strong.  On call MD notified
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
## 2019-08-30 NOTE — Evaluation (Signed)
Physical Therapy Evaluation Patient Details Name: Susan Potter MRN: [id number] DOB: 1926-11-25 Today's Date: 08/30/2019   History of Present Illness  Pt is a 86 y/o female admitted following fall. Found to have R tibial plateau fx, to be managed conservatively. PMH includes a fib, DM, HTN.   Clinical Impression  Pt admitted secondary to problem above with deficits below. Pt requiring max to total A +2 for bed mobility. Attempted to stand with total A +2, however, only able to achieve minimal clearance of bottom from surface and unable to maintain NWB on RLE. Feel pt would benefit from SNF level therapies, however, pt's family may decide to take pt home. Will continue to follow acutely to maximize functional mobility independence and safety.     Follow Up Recommendations SNF;Supervision/Assistance - 24 hour(max HH services if family decides to take pt home )    Equipment Recommendations  Wheelchair (measurements PT);Wheelchair cushion (measurements PT)    Recommendations for Other Services       Precautions / Restrictions Precautions Precautions: Fall Required Braces or Orthoses: Other Brace Other Brace: hinged knee brace RLE locked in extension.  Restrictions Weight Bearing Restrictions: Yes RLE Weight Bearing: Non weight bearing      Mobility  Bed Mobility Overal bed mobility: Needs Assistance Bed Mobility: Supine to Sit;Sit to Supine     Supine to sit: Max assist;+2 for physical assistance Sit to supine: Total assist;+2 for physical assistance   General bed mobility comments: Max A +2 for trunk assist and LE assist to come to sitting. Pt able to assist some with UEs. Total A +2 to return to supine and readjust once in supine.   Transfers Overall transfer level: Needs assistance Equipment used: Rolling walker (2 wheeled) Transfers: Sit to/from Stand Sit to Stand: Total assist;+2 physical assistance;From elevated surface         General transfer comment: Attempted to  stand from elevated height with +2. PT had foot under pt's RLE to ensure maintenance of precautions, however, pt putting weight through. Only able to get mild clearance of hips with +2 total A with use of bed pad.   Ambulation/Gait                Stairs            Wheelchair Mobility    Modified Rankin (Stroke Patients Only)       Balance Overall balance assessment: Needs assistance Sitting-balance support: No upper extremity supported;Feet supported Sitting balance-Leahy Scale: Fair                                       Pertinent Vitals/Pain Pain Assessment: Faces Faces Pain Scale: Hurts even more Pain Location: R knee  Pain Descriptors / Indicators: Grimacing;Guarding Pain Intervention(s): Limited activity within patient's tolerance;Monitored during session;Repositioned    Home Living Family/patient expects to be discharged to:: Private residence Living Arrangements: Alone Available Help at Discharge: Family;Available 24 hours/day Type of Home: House Home Access: Level entry     Home Layout: One level Home Equipment: Walker - 2 wheels;Wheelchair - manual(extra wide wheel chair)      Prior Function Level of Independence: Independent               Hand Dominance        Extremity/Trunk Assessment   Upper Extremity Assessment Upper Extremity Assessment: Defer to OT evaluation    Lower Extremity Assessment Lower  Extremity Assessment: RLE deficits/detail;Generalized weakness RLE Deficits / Details: Pt with hinge brace on locked in extension. Pt with increased pain and noted difficulty when performing functional mobility.     Cervical / Trunk Assessment Cervical / Trunk Assessment: Normal  Communication   Communication: HOH  Cognition Arousal/Alertness: Awake/alert Behavior During Therapy: WFL for tasks assessed/performed Overall Cognitive Status: Impaired/Different from baseline Area of Impairment: Memory;Problem  solving;Safety/judgement;Awareness                     Memory: Decreased short-term memory   Safety/Judgement: Decreased awareness of deficits Awareness: Emergent Problem Solving: Slow processing;Decreased initiation;Requires verbal cues General Comments: Pt asking multiple times during session "why am I sitting up"; explained the role of therapy. Decreased awareness throughout and slowed processing.       General Comments General comments (skin integrity, edema, etc.): Pt's granddaughter present. Educated about recommendations for SNF, however, reports they want to take pt home if possible.     Exercises     Assessment/Plan    PT Assessment Patient needs continued PT services  PT Problem List Decreased strength;Decreased range of motion;Decreased activity tolerance;Decreased balance;Decreased mobility;Decreased knowledge of use of DME;Decreased safety awareness;Decreased cognition;Decreased knowledge of precautions;Pain       PT Treatment Interventions DME instruction;Functional mobility training;Therapeutic activities;Therapeutic exercise;Balance training;Patient/family education;Cognitive remediation    PT Goals (Current goals can be found in the Care Plan section)  Acute Rehab PT Goals Patient Stated Goal: for pt to go home per granddaughter PT Goal Formulation: With patient Time For Goal Achievement: 09/13/19 Potential to Achieve Goals: Good    Frequency Min 3X/week   Barriers to discharge        Co-evaluation               AM-PAC PT "6 Clicks" Mobility  Outcome Measure Help needed turning from your back to your side while in a flat bed without using bedrails?: Total Help needed moving from lying on your back to sitting on the side of a flat bed without using bedrails?: Total Help needed moving to and from a bed to a chair (including a wheelchair)?: Total Help needed standing up from a chair using your arms (e.g., wheelchair or bedside chair)?:  Total Help needed to walk in hospital room?: Total Help needed climbing 3-5 steps with a railing? : Total 6 Click Score: 6    End of Session Equipment Utilized During Treatment: Gait belt Activity Tolerance: Patient tolerated treatment well Patient left: in bed;with call bell/phone within reach;with bed alarm set;with family/visitor present Nurse Communication: Mobility status PT Visit Diagnosis: Difficulty in walking, not elsewhere classified (R26.2);Muscle weakness (generalized) (M62.81);Unsteadiness on feet (R26.81);History of falling (Z91.81);Pain Pain - Right/Left: Right Pain - part of body: Knee    Time: 8295-6213 PT Time Calculation (min) (ACUTE ONLY): 36 min   Charges:   PT Evaluation $PT Eval Moderate Complexity: 1 Mod          Reuel Derby, PT, DPT  Acute Rehabilitation Services  Pager: [phone] Office: [phone]   Rudean Hitt 08/30/2019, 5:53 PM

## 2019-08-30 NOTE — Consult Note (Signed)
Brief orthopedic consult note:  Full consult note to follow.  Patient with nondisplaced right medial tibial plateau fracture status post fall.  Given the minimal displacement we will plan for nonoperative treatment.  Patient will be given a hinged knee brace and will be nonweightbearing for 4 weeks on the right lower extremity.  She will work with physical therapy. Okay for her diet from orthopedic standpoint.

## 2019-08-30 NOTE — ED Notes (Signed)
Lunch Tray Ordered @ 1043. 

## 2019-08-30 NOTE — Progress Notes (Signed)
ANTICOAGULATION CONSULT NOTE - Initial Consult  Pharmacy Consult for Warfarin Indication: atrial fibrillation  No Known Allergies  Patient Measurements: Height: 5\' 7"  (170.2 cm) Weight: 99.8 kg (220 lb) IBW/kg (Calculated) : 61.6  Vital Signs: Temp: 99.5 F (37.5 C) (05/12 0823) Temp Source: Oral (05/12 0823) BP: 108/64 (05/12 1230) Pulse Rate: 90 (05/12 1230)  Labs: Recent Labs    08/29/19 1817 08/30/19 0310  HGB 11.7* 10.9*  HCT 36.4 33.4*  PLT 251 239  APTT  --  39*  LABPROT 29.4* 29.4*  INR 2.9* 2.9*  CREATININE 1.41*  --   TROPONINIHS  --  13    Estimated Creatinine Clearance: 31.5 mL/min (A) (by C-G formula based on SCr of 1.41 mg/dL (H)).   Medical History: Past Medical History:  Diagnosis Date  . AF (paroxysmal atrial fibrillation) (HCC) 08/29/2019  . Mixed diabetic hyperlipidemia associated with type 2 diabetes mellitus (HCC) 08/29/2019  . Type 2 diabetes mellitus without complication, without long-term current use of insulin (HCC) 08/29/2019  . Vitamin D deficiency     Assessment: 86 year-old female with history of atrial fibrillation, on coumadin prior to admission that is managed by a coumadin clinic, presents from home after a fall 5/10 resulting in a nondisplaced right medial tibial plateau fracture that is treated nonoperatively. Pharmacy consulted to resume warfarin for atrial fibrillation.  Last dose of coumadin reported 5/11 0800. INR on admission is 2.9. H&H decreased from 11.7 to 10.9 and 36.4 to 33.4 without bleeding reported per ED RN and 5N RN.  Home warfarin regimen: 5 mg daily.  Goal of Therapy:  INR 2-3 Monitor platelets by anticoagulation protocol: Yes   Plan:  Warfarin 5 mg PO x1 today Daily INR, CBC Monitor for signs and symptoms of bleeding     Joakim Huesman L. 7/11, PharmD [facility] PGY1 Pharmacy Resident [phone] 08/30/19      1:47 PM  Please check AMION for all [facility] Pharmacy phone numbers After 10:00 PM, call the Main  Pharmacy [phone]

## 2019-08-30 NOTE — Care Management Obs Status (Signed)
MEDICARE OBSERVATION STATUS NOTIFICATION   Patient Details  Name: Susan Potter MRN: [id number] Date of Birth: [birth year]   Medicare Observation Status Notification Given:  Yes    Lawerance Sabal, RN 08/30/2019, 2:42 PM

## 2019-08-30 NOTE — Consult Note (Signed)
Reason for Consult:Right tibia plateau fx Referring Physician: [name] is an 86 y.o. female.  HPI: Susan Potter was in her garden and bent over to pick up a potted plant. She lost her balance and fell, injuring her right knee. She was not able to bear weight afterwards. She came to the ED where x-rays showed a tibia plateau fx and orthopedic surgery was consulted. She c/o localized pain to the knee.  Past Medical History:  Diagnosis Date  . AF (paroxysmal atrial fibrillation) (HCC) 08/29/2019  . Mixed diabetic hyperlipidemia associated with type 2 diabetes mellitus (HCC) 08/29/2019  . Type 2 diabetes mellitus without complication, without long-term current use of insulin (HCC) 08/29/2019  . Vitamin Susan deficiency     History reviewed. No pertinent surgical history.  Family History  Problem Relation Age of Onset  . Healthy Mother   . Early death Father     Social History:  reports that she has never smoked. She has never used smokeless tobacco. She reports that she does not drink alcohol or use drugs.  Allergies: No Known Allergies  Medications: I have reviewed the patient's current medications.  Results for orders placed or performed during the hospital encounter of 08/29/19 (from the past 48 hour(s))  Protime-INR     Status: Abnormal   Collection Time: 08/29/19  6:17 PM  Result Value Ref Range   Prothrombin Time 29.4 (H) 11.4 - 15.2 seconds   INR 2.9 (H) 0.8 - 1.2    Comment: (NOTE) INR goal varies based on device and disease states. Performed at [facility], [address] [place], [place] [address]   Basic metabolic panel     Status: Abnormal   Collection Time: 08/29/19  6:17 PM  Result Value Ref Range   Sodium 133 (L) 135 - 145 mmol/L   Potassium 4.8 3.5 - 5.1 mmol/L   Chloride 99 98 - 111 mmol/L   CO2 23 22 - 32 mmol/L   Glucose, Bld 216 (H) 70 - 99 mg/dL    Comment: Glucose reference range applies only to samples taken after fasting for at least 8  hours.   BUN 17 8 - 23 mg/dL   Creatinine, Ser 8.12 (H) 0.44 - 1.00 mg/dL   Calcium 8.7 (L) 8.9 - 10.3 mg/dL   GFR calc non Af Amer 32 (L) >60 mL/min   GFR calc Af Amer 38 (L) >60 mL/min   Anion gap 11 5 - 15    Comment: Performed at [facility], [address], [place], [place] [address]  CBC with Differential     Status: Abnormal   Collection Time: 08/29/19  6:17 PM  Result Value Ref Range   WBC 14.3 (H) 4.0 - 10.5 K/uL   RBC 3.95 3.87 - 5.11 MIL/uL   Hemoglobin 11.7 (L) 12.0 - 15.0 g/dL   HCT 01.7 49.4 - 49.6 %   MCV 92.2 80.0 - 100.0 fL   MCH 29.6 26.0 - 34.0 pg   MCHC 32.1 30.0 - 36.0 g/dL   RDW 75.9 16.3 - 84.6 %   Platelets 251 150 - 400 K/uL   nRBC 0.0 0.0 - 0.2 %   Neutrophils Relative % 87 %   Neutro Abs 12.4 (H) 1.7 - 7.7 K/uL   Lymphocytes Relative 5 %   Lymphs Abs 0.7 0.7 - 4.0 K/uL   Monocytes Relative 7 %   Monocytes Absolute 1.0 0.1 - 1.0 K/uL   Eosinophils Relative 0 %  Eosinophils Absolute 0.0 0.0 - 0.5 K/uL   Basophils Relative 1 %   Basophils Absolute 0.1 0.0 - 0.1 K/uL   Immature Granulocytes 0 %   Abs Immature Granulocytes 0.06 0.00 - 0.07 K/uL    Comment: Performed at [facility], [address], [place], [place] [address]  SARS Coronavirus 2 by RT PCR (hospital order, performed in [facility] hospital lab) Nasopharyngeal Nasopharyngeal Swab     Status: None   Collection Time: 08/29/19  8:16 PM   Specimen: Nasopharyngeal Swab  Result Value Ref Range   SARS Coronavirus 2 NEGATIVE NEGATIVE    Comment: (NOTE) SARS-CoV-2 target nucleic acids are NOT DETECTED. The SARS-CoV-2 RNA is generally detectable in upper and lower respiratory specimens during the acute phase of infection. The lowest concentration of SARS-CoV-2 viral copies this assay can detect is 250 copies / mL. A negative result does not preclude SARS-CoV-2 infection and should not be used as the sole basis for treatment or other patient management decisions.  A negative result  may occur with improper specimen collection / handling, submission of specimen other than nasopharyngeal swab, presence of viral mutation(s) within the areas targeted by this assay, and inadequate number of viral copies (<250 copies / mL). A negative result must be combined with clinical observations, patient history, and epidemiological information. Fact Sheet for Patients:   BoilerBrush.com.cy Fact Sheet for Healthcare Providers: https://pope.com/ This test is not yet approved or cleared  by the [place] FDA and has been authorized for detection and/or diagnosis of SARS-CoV-2 by FDA under an Emergency Use Authorization (EUA).  This EUA will remain in effect (meaning this test can be used) for the duration of the COVID-19 declaration under Section 564(b)(1) of the Act, 21 U.S.C. section 360bbb-3(b)(1), unless the authorization is terminated or revoked sooner. Performed at [facility], [address], [place], [place] [address]   CBG monitoring, ED     Status: Abnormal   Collection Time: 08/29/19  9:55 PM  Result Value Ref Range   Glucose-Capillary 168 (H) 70 - 99 mg/dL    Comment: Glucose reference range applies only to samples taken after fasting for at least 8 hours.  Hemoglobin A1c     Status: Abnormal   Collection Time: 08/30/19  3:10 AM  Result Value Ref Range   Hgb A1c MFr Bld 7.3 (H) 4.8 - 5.6 %    Comment: (NOTE) Pre diabetes:          5.7%-6.4% Diabetes:              >6.4% Glycemic control for   <7.0% adults with diabetes    Mean Plasma Glucose 162.81 mg/dL    Comment: Performed at [facility], [address] [place], [place] [address]  Protime-INR     Status: Abnormal   Collection Time: 08/30/19  3:10 AM  Result Value Ref Range   Prothrombin Time 29.4 (H) 11.4 - 15.2 seconds   INR 2.9 (H) 0.8 - 1.2    Comment: (NOTE) INR goal varies based on device and disease states. Performed at [facility], [address] [place], [place] [address]   APTT     Status: Abnormal   Collection Time: 08/30/19  3:10 AM  Result Value Ref Range   aPTT 39 (H) 24 - 36 seconds    Comment:        IF BASELINE aPTT IS ELEVATED, SUGGEST PATIENT RISK ASSESSMENT BE USED TO DETERMINE APPROPRIATE  ANTICOAGULANT THERAPY. Performed at [facility], [address] [place], [place] [address]   Troponin I (High Sensitivity)     Status: None   Collection Time: 08/30/19  3:10 AM  Result Value Ref Range   Troponin I (High Sensitivity) 13 <18 ng/L    Comment: (NOTE) Elevated high sensitivity troponin I (hsTnI) values and significant  changes across serial measurements may suggest ACS but many other  chronic and acute conditions are known to elevate hsTnI results.  Refer to the "Links" section for chest pain algorithms and additional  guidance. Performed at [facility], [address] [place], [place] [address]   VITAMIN Susan 25 Hydroxy (Vit-Susan Deficiency, Fractures)     Status: Abnormal   Collection Time: 08/30/19  3:10 AM  Result Value Ref Range   Vit Susan, 25-Hydroxy 23.61 (L) 30 - 100 ng/mL    Comment: (NOTE) Vitamin Susan deficiency has been defined by the Institute of Medicine  and an Endocrine Society practice guideline as a level of serum 25-OH  vitamin Susan less than 20 ng/mL (1,2). The Endocrine Society went on to  further define vitamin Susan insufficiency as a level between 21 and 29  ng/mL (2). 1. IOM (Institute of Medicine). 2010. Dietary reference intakes for  calcium and Susan. [place]: [employer] Academ[employer]. 2. Holick MF, Binkley [place], Bischoff-Ferrari HA, et al. Evaluation,  treatment, and prevention of vitamin Susan deficiency: an Endocrine  Society clinical practice guideline, JCEM. 2011 Jul; 96(7): 1911-30. Performed at [facility], [address] [place], [place] [address]   Magnesium     Status: Abnormal   Collection Time: 08/30/19  3:10 AM  Result Value Ref  Range   Magnesium 1.6 (L) 1.7 - 2.4 mg/dL    Comment: Performed at [facility], [address] [place], [place] [address]  CBC WITH DIFFERENTIAL     Status: Abnormal   Collection Time: 08/30/19  3:10 AM  Result Value Ref Range   WBC 11.9 (H) 4.0 - 10.5 K/uL   RBC 3.66 (L) 3.87 - 5.11 MIL/uL   Hemoglobin 10.9 (L) 12.0 - 15.0 g/dL   HCT 29.533.4 (L) 62.136.0 - 30.846.0 %   MCV 91.3 80.0 - 100.0 fL   MCH 29.8 26.0 - 34.0 pg   MCHC 32.6 30.0 - 36.0 g/dL   RDW 65.713.2 84.611.5 - 96.215.5 %   Platelets 239 150 - 400 K/uL   nRBC 0.0 0.0 - 0.2 %   Neutrophils Relative % 82 %   Neutro Abs 9.8 (H) 1.7 - 7.7 K/uL   Lymphocytes Relative 6 %   Lymphs Abs 0.8 0.7 - 4.0 K/uL   Monocytes Relative 11 %   Monocytes Absolute 1.3 (H) 0.1 - 1.0 K/uL   Eosinophils Relative 0 %   Eosinophils Absolute 0.0 0.0 - 0.5 K/uL   Basophils Relative 0 %   Basophils Absolute 0.1 0.0 - 0.1 K/uL   Immature Granulocytes 1 %   Abs Immature Granulocytes 0.07 0.00 - 0.07 K/uL    Comment: Performed at [facility], [address] [place], [place] [address]  CBG monitoring, ED     Status: Abnormal   Collection Time: 08/30/19  7:25 AM  Result Value Ref Range   Glucose-Capillary 225 (H) 70 - 99 mg/dL    Comment: Glucose reference range applies only to samples taken after fasting for at least 8 hours.  Urinalysis, Routine w reflex microscopic  Status: Abnormal   Collection Time: 08/30/19  9:38 AM  Result Value Ref Range   Color, Urine YELLOW YELLOW   APPearance HAZY (A) CLEAR   Specific Gravity, Urine 1.016 1.005 - 1.030   pH 5.0 5.0 - 8.0   Glucose, UA NEGATIVE NEGATIVE mg/dL   Hgb urine dipstick MODERATE (A) NEGATIVE   Bilirubin Urine NEGATIVE NEGATIVE   Ketones, ur 5 (A) NEGATIVE mg/dL   Protein, ur NEGATIVE NEGATIVE mg/dL   Nitrite NEGATIVE NEGATIVE   Leukocytes,Ua MODERATE (A) NEGATIVE   RBC / HPF 21-50 0 - 5 RBC/hpf   WBC, UA >50 (H) 0 - 5 WBC/hpf   Bacteria, UA MANY (A) NONE SEEN   Squamous Epithelial / LPF  0-5 0 - 5   Mucus PRESENT     Comment: Performed at [facility], [address] [place], [place] [address]  CBG monitoring, ED     Status: Abnormal   Collection Time: 08/30/19 12:24 PM  Result Value Ref Range   Glucose-Capillary 162 (H) 70 - 99 mg/dL    Comment: Glucose reference range applies only to samples taken after fasting for at least 8 hours.    CT Head Wo Contrast  Result Date: 08/29/2019 CLINICAL DATA:  Fall.  On blood thinner. EXAM: CT HEAD WITHOUT CONTRAST TECHNIQUE: Contiguous axial images were obtained from the base of the skull through the vertex without intravenous contrast. COMPARISON:  None. FINDINGS: Brain: Advanced atrophy. Negative for hydrocephalus. Chronic microvascular ischemic changes are present in the white matter. Small chronic lacunar infarction left head of caudate. Negative for acute infarct, hemorrhage, mass Vascular: Atherosclerotic calcification. Negative for hyperdense vessel Skull: Negative Sinuses/Orbits: Paranasal sinuses clear. No orbital lesion. Cataract extraction on the left Other: None IMPRESSION: Atrophy and chronic microvascular ischemic change in the white matter. No acute intracranial abnormality. Electronically Signed   By: Franchot Gallo M.Susan.   On: 08/29/2019 19:08   CT Cervical Spine Wo Contrast  Result Date: 08/29/2019 CLINICAL DATA:  Neck pain.  Fall. EXAM: CT CERVICAL SPINE WITHOUT CONTRAST TECHNIQUE: Multidetector CT imaging of the cervical spine was performed without intravenous contrast. Multiplanar CT image reconstructions were also generated. COMPARISON:  None. FINDINGS: Alignment: No subluxation Skull base and vertebrae: No acute fracture. No primary bone lesion or focal pathologic process. Soft tissues and spinal canal: No prevertebral fluid or swelling. No visible canal hematoma. Disc levels: Disc space narrowing most pronounced at C5-6. Diffuse bilateral degenerative facet disease. Upper chest: No acute findings Other: None  IMPRESSION: Degenerative disc and facet disease as above. No acute bony abnormality. Electronically Signed   By: Rolm Baptise M.Susan.   On: 08/29/2019 19:07   CT Knee Right Wo Contrast  Result Date: 08/29/2019 CLINICAL DATA:  [place]. Tibial plateau fracture. EXAM: CT OF THE right KNEE WITHOUT CONTRAST TECHNIQUE: Multidetector CT imaging of the right knee was performed according to the standard protocol. Multiplanar CT image reconstructions were also generated. COMPARISON:  Right knee radiographs, same date FINDINGS: Tibial plateau fractures are demonstrated. There are fractures on both sides of the tibial spines. The lateral plateau fracture demonstrates minimal depression of 2 mm centrally. The medial tibial plateau fracture begins near the medial spine and runs longitudinally out through the medial metadiaphyseal cortex with slight impaction but no significant displacement. The fibula, femur and patella are intact. Expected hemarthrosis. The PCL is intact. Difficult to evaluate the ACL. The medial and lateral collateral ligament complexes appear grossly intact by CT. The quadriceps and patellar tendons  are intact. IMPRESSION: 1. Tibial plateau fractures as described above. 2. Expected hemarthrosis. 3. Difficult to evaluate the ACL. The PCL and the medial and lateral collateral ligament complexes appear grossly intact by CT. Electronically Signed   By: Rudie Meyer M.Susan.   On: 08/29/2019 20:02   DG Pelvis Portable  Result Date: 08/29/2019 CLINICAL DATA:  Status post fall from a standing position today. Pelvic pain. Initial encounter. EXAM: PORTABLE PELVIS 1-2 VIEWS COMPARISON:  None. FINDINGS: There is no evidence of pelvic fracture or diastasis. No pelvic bone lesions are seen. IMPRESSION: Negative exam. Electronically Signed   By: Drusilla Kanner M.Susan.   On: 08/29/2019 18:41   DG Chest Portable 1 View  Result Date: 08/29/2019 CLINICAL DATA:  Chest pain after fall. EXAM: PORTABLE CHEST 1 VIEW COMPARISON:   None. FINDINGS: There is no evidence of acute infiltrate, pleural effusion or pneumothorax. The heart size and mediastinal contours are within normal limits. There is moderate severity calcification of the aortic arch. A chronic appearing deformity is seen involving the left humeral head. IMPRESSION: No active disease. Electronically Signed   By: Aram Candela M.Susan.   On: 08/29/2019 18:42   DG Knee Right Port  Result Date: 08/29/2019 CLINICAL DATA:  Right knee pain due to an injury suffered in a fall position today. Initial encounter. EXAM: PORTABLE RIGHT KNEE - 1-2 VIEW COMPARISON:  None. FINDINGS: The patient has a nondisplaced fracture of the proximal tibia. The fracture extends from just peripheral to the lateral tibial eminence in an inferior and medial orientation through the proximal diaphysis of the femur on the medial side. There is no depression of the tibial plateaus. No other fracture is identified. There is some osteophytosis about the knee although joint spaces are preserved. Small joint effusion is noted. Atherosclerosis is identified. IMPRESSION: Nondisplaced proximal tibial fracture as described above. Electronically Signed   By: Drusilla Kanner M.Susan.   On: 08/29/2019 18:46    Review of Systems  HENT: Negative for ear discharge, ear pain, hearing loss and tinnitus.   Eyes: Negative for photophobia and pain.  Respiratory: Negative for cough and shortness of breath.   Cardiovascular: Negative for chest pain.  Gastrointestinal: Negative for abdominal pain, nausea and vomiting.  Genitourinary: Negative for dysuria, flank pain, frequency and urgency.  Musculoskeletal: Positive for arthralgias (Right knee). Negative for back pain, myalgias and neck pain.  Neurological: Negative for dizziness and headaches.  Hematological: Does not bruise/bleed easily.  Psychiatric/Behavioral: The patient is not nervous/anxious.    Blood pressure 110/63, pulse 83, temperature 98.4 F (36.9 C),  temperature source Oral, resp. rate 16, height 5\' 7"  (1.702 m), weight 99.8 kg, SpO2 100 %. Physical Exam  Constitutional: She appears well-developed and well-nourished. No distress.  HENT:  Head: Normocephalic and atraumatic.  Eyes: Conjunctivae are normal. Right eye exhibits no discharge. Left eye exhibits no discharge. No scleral icterus.  Cardiovascular: Normal rate and regular rhythm.  Respiratory: Effort normal. No respiratory distress.  Musculoskeletal:     Cervical back: Normal range of motion.     Comments: LLE No traumatic wounds or rash, mild ecchymosis about knee  Hinged knee brace in place  No ankle effusion  Sens DPN, SPN, TN intact  Motor EHL, ext, flex, evers 5/5  DP 1+, PT 0, No significant edema  Neurological: She is alert.  Skin: Skin is warm and dry. She is not diaphoretic.  Psychiatric: She has a normal mood and affect. Her behavior is normal.    Assessment/Plan:  Right tibia plateau fx -- Plan non-operative management for this fx in a hinged knee brace with NWB RLE. She should f/u with Dr. Susa Simmonds ~1 week s/p discharge. Multiple medical problems including paroxysmal atrial fibrillation on coumadin, diabetes mellitus type 2, vitamin Susan deficiency, osteoporosis, hypertension and hyperlipidemia -- per primary service    Freeman Caldron, PA-C Orthopedic Surgery [phone] 08/30/2019, 3:41 PM

## 2019-08-30 NOTE — Progress Notes (Signed)
  Echocardiogram 2D Echocardiogram has been performed.  Susan Potter 08/30/2019, 2:46 PM

## 2019-08-30 NOTE — Evaluation (Signed)
Occupational Therapy Evaluation Patient Details Name: Susan Potter MRN: [id number] DOB: 07/10/1926 Today's Date: 08/30/2019    History of Present Illness Pt is a 86 y/o female admitted following fall. Found to have R tibial plateau fx, to be managed conservatively. PMH includes a fib, DM, HTN.    Clinical Impression   PTA pt living alone, independent for BADL/IADL. At time of eval, pt is largely limited by RLE pain and NWB precautions. Bed mobility completed at max- total A +2 due to difficulty advancing BLEs and minimal ability to engage BUEs for support. Once EOB, pt asking questions like "why am I sitting up" "what am I doing", suggesting cognitive deficits in memory, problem solving, and awareness. Pt attempted sit <> stand with total A +2 with only slight hip clearance and poor ability to maintain RLE NWB. Plan to advance sessions with lateral scoot transfers. Given current status, pt will need SNF level of care at d/c. Family seems to want to take pt home, initiated education on difficulty of supporting pt ADLs 24/7 at total A level. Will continue to follow per POC listed below.    Follow Up Recommendations  SNF;Supervision/Assistance - 24 hour    Equipment Recommendations  3 in 1 bedside commode;Wheelchair (measurements OT);Wheelchair cushion (measurements OT);Hospital bed    Recommendations for Other Services       Precautions / Restrictions Precautions Precautions: Fall Required Braces or Orthoses: Other Brace Other Brace: hinged knee brace RLE locked in extension.  Restrictions Weight Bearing Restrictions: Yes RLE Weight Bearing: Non weight bearing      Mobility Bed Mobility Overal bed mobility: Needs Assistance Bed Mobility: Supine to Sit;Sit to Supine     Supine to sit: Max assist;+2 for physical assistance Sit to supine: Total assist;+2 for physical assistance   General bed mobility comments: Max A +2 for trunk assist and LE assist to come to sitting. Pt able to  assist some with UEs. Total A +2 to return to supine and readjust once in supine.   Transfers Overall transfer level: Needs assistance Equipment used: Rolling walker (2 wheeled) Transfers: Sit to/from Stand Sit to Stand: Total assist;+2 physical assistance;From elevated surface         General transfer comment: Attempted to stand from elevated height with +2 with PT/OT. PT had foot under pt's RLE to ensure maintenance of precautions, however, pt putting weight through. Only able to get mild clearance of hips with +2 total A with use of bed pad.    Balance Overall balance assessment: Needs assistance Sitting-balance support: No upper extremity supported;Feet supported Sitting balance-Leahy Scale: Fair     Standing balance support: Bilateral upper extremity supported;During functional activity Standing balance-Leahy Scale: Zero Standing balance comment: unable to attain full stand                           ADL either performed or assessed with clinical judgement   ADL Overall ADL's : Needs assistance/impaired Eating/Feeding: Set up;Sitting   Grooming: Set up;Sitting   Upper Body Bathing: Minimal assistance;Sitting   Lower Body Bathing: Maximal assistance;Total assistance;Sitting/lateral leans;Sit to/from stand   Upper Body Dressing : Set up;Sitting   Lower Body Dressing: Maximal assistance;Total assistance;Sitting/lateral leans;Sit to/from stand   Toilet Transfer: Total assistance;+2 for physical assistance;+2 for safety/equipment;RW Toilet Transfer Details (indicate cue type and reason): attempted sit <> stand with total A +2, unable to clear hips and maintain WB Toileting- Clothing Manipulation and Hygiene: Total assistance;Bed level Toileting -  Clothing Manipulation Details (indicate cue type and reason): incontinent of urine x2 in session without awareness, total A for peri care in bed     Functional mobility during ADLs: Total assistance;+2 for physical  assistance;Rolling walker General ADL Comments: difficulty maintaining WB precautions     Vision Patient Visual Report: No change from baseline       Perception     Praxis      Pertinent Vitals/Pain Pain Assessment: Faces Faces Pain Scale: Hurts even more Pain Location: R knee  Pain Descriptors / Indicators: Grimacing;Guarding Pain Intervention(s): Limited activity within patient's tolerance;Monitored during session;Repositioned     Hand Dominance     Extremity/Trunk Assessment Upper Extremity Assessment Upper Extremity Assessment: Generalized weakness   Lower Extremity Assessment Lower Extremity Assessment: Defer to PT evaluation;RLE deficits/detail RLE Deficits / Details: Pt with hinge brace on locked in extension.   Cervical / Trunk Assessment Cervical / Trunk Assessment: Normal   Communication Communication Communication: HOH   Cognition Arousal/Alertness: Awake/alert Behavior During Therapy: WFL for tasks assessed/performed Overall Cognitive Status: Impaired/Different from baseline Area of Impairment: Memory;Problem solving;Safety/judgement;Awareness                     Memory: Decreased short-term memory   Safety/Judgement: Decreased awareness of deficits Awareness: Emergent Problem Solving: Slow processing;Decreased initiation;Requires verbal cues General Comments: Pt asking multiple times during session "why am I sitting up"; explained the role of therapy. Decreased awareness throughout and slowed processing with basic mobility tasks   General Comments  Pt's granddaughter present. Educated about recommendations for SNF, however, reports they want to take pt home if possible.     Exercises     Shoulder Instructions      Home Living Family/patient expects to be discharged to:: Private residence Living Arrangements: Alone Available Help at Discharge: Family;Available 24 hours/day Type of Home: House Home Access: Level entry     Home Layout:  One level     Bathroom Shower/Tub: Chief Strategy Officer: Standard     Home Equipment: Environmental consultant - 2 wheels;Wheelchair - manual   Additional Comments: extra wide wheelchair that does not fit around home      Prior Functioning/Environment Level of Independence: Independent                 OT Problem List: Decreased strength;Decreased knowledge of use of DME or AE;Decreased knowledge of precautions;Decreased activity tolerance;Decreased cognition;Impaired balance (sitting and/or standing);Pain      OT Treatment/Interventions: Self-care/ADL training;Therapeutic exercise;Patient/family education;Balance training;Therapeutic activities;DME and/or AE instruction    OT Goals(Current goals can be found in the care plan section) Acute Rehab OT Goals Patient Stated Goal: for pt to go home per granddaughter OT Goal Formulation: With patient/family Time For Goal Achievement: 09/13/19 Potential to Achieve Goals: Good  OT Frequency: Min 2X/week   Barriers to D/C:            Co-evaluation PT/OT/SLP Co-Evaluation/Treatment: Yes Reason for Co-Treatment: For patient/therapist safety;To address functional/ADL transfers   OT goals addressed during session: ADL's and self-care;Proper use of Adaptive equipment and DME      AM-PAC OT "6 Clicks" Daily Activity     Outcome Measure Help from another person eating meals?: None Help from another person taking care of personal grooming?: None Help from another person toileting, which includes using toliet, bedpan, or urinal?: Total Help from another person bathing (including washing, rinsing, drying)?: A Lot Help from another person to put on and taking off regular upper body  clothing?: A Little Help from another person to put on and taking off regular lower body clothing?: Total 6 Click Score: 15   End of Session Equipment Utilized During Treatment: Gait belt;Rolling walker;Right knee immobilizer Nurse Communication: Mobility  status  Activity Tolerance: Patient tolerated treatment well Patient left: in bed;with call bell/phone within reach;with bed alarm set;with family/visitor present  OT Visit Diagnosis: Unsteadiness on feet (R26.81);Other abnormalities of gait and mobility (R26.89);Muscle weakness (generalized) (M62.81);Pain Pain - Right/Left: Right Pain - part of body: Knee;Leg                Time: 8299-3716 OT Time Calculation (min): 31 min Charges:  OT General Charges $OT Visit: 1 Visit OT Evaluation $OT Eval Moderate Complexity: [place], MSOT, OTR/L Acute Rehabilitation Services [facility] Office Number: [phone] Pager: [phone]  Zenovia Jarred 08/30/2019, 6:10 PM

## 2019-08-31 DIAGNOSIS — B9620 Unspecified Escherichia coli [E. coli] as the cause of diseases classified elsewhere: Secondary | ICD-10-CM | POA: Diagnosis present

## 2019-08-31 DIAGNOSIS — I071 Rheumatic tricuspid insufficiency: Secondary | ICD-10-CM | POA: Diagnosis present

## 2019-08-31 DIAGNOSIS — M810 Age-related osteoporosis without current pathological fracture: Secondary | ICD-10-CM | POA: Diagnosis present

## 2019-08-31 DIAGNOSIS — I480 Paroxysmal atrial fibrillation: Secondary | ICD-10-CM | POA: Diagnosis present

## 2019-08-31 DIAGNOSIS — W010XXA Fall on same level from slipping, tripping and stumbling without subsequent striking against object, initial encounter: Secondary | ICD-10-CM | POA: Diagnosis present

## 2019-08-31 DIAGNOSIS — Z7901 Long term (current) use of anticoagulants: Secondary | ICD-10-CM | POA: Diagnosis not present

## 2019-08-31 DIAGNOSIS — S0083XA Contusion of other part of head, initial encounter: Secondary | ICD-10-CM | POA: Diagnosis present

## 2019-08-31 DIAGNOSIS — Z7984 Long term (current) use of oral hypoglycemic drugs: Secondary | ICD-10-CM | POA: Diagnosis not present

## 2019-08-31 DIAGNOSIS — T148XXA Other injury of unspecified body region, initial encounter: Secondary | ICD-10-CM | POA: Diagnosis present

## 2019-08-31 DIAGNOSIS — N390 Urinary tract infection, site not specified: Secondary | ICD-10-CM | POA: Diagnosis present

## 2019-08-31 DIAGNOSIS — E559 Vitamin D deficiency, unspecified: Secondary | ICD-10-CM | POA: Diagnosis present

## 2019-08-31 DIAGNOSIS — S82141A Displaced bicondylar fracture of right tibia, initial encounter for closed fracture: Secondary | ICD-10-CM | POA: Diagnosis present

## 2019-08-31 DIAGNOSIS — I10 Essential (primary) hypertension: Secondary | ICD-10-CM | POA: Diagnosis present

## 2019-08-31 DIAGNOSIS — Y92008 Other place in unspecified non-institutional (private) residence as the place of occurrence of the external cause: Secondary | ICD-10-CM | POA: Diagnosis not present

## 2019-08-31 DIAGNOSIS — E785 Hyperlipidemia, unspecified: Secondary | ICD-10-CM | POA: Diagnosis present

## 2019-08-31 DIAGNOSIS — E1169 Type 2 diabetes mellitus with other specified complication: Secondary | ICD-10-CM | POA: Diagnosis present

## 2019-08-31 DIAGNOSIS — R791 Abnormal coagulation profile: Secondary | ICD-10-CM | POA: Diagnosis present

## 2019-08-31 DIAGNOSIS — Z20822 Contact with and (suspected) exposure to covid-19: Secondary | ICD-10-CM | POA: Diagnosis present

## 2019-08-31 LAB — GLUCOSE, CAPILLARY
Glucose-Capillary: 236 mg/dL — ABNORMAL HIGH (ref 70–99)
Glucose-Capillary: 193 mg/dL — ABNORMAL HIGH (ref 70–99)
Glucose-Capillary: 200 mg/dL — ABNORMAL HIGH (ref 70–99)
Glucose-Capillary: 293 mg/dL — ABNORMAL HIGH (ref 70–99)

## 2019-08-31 LAB — PROTIME-INR
INR: 1.5 — ABNORMAL HIGH (ref 0.8–1.2)
Prothrombin Time: 17.4 seconds — ABNORMAL HIGH (ref 11.4–15.2)

## 2019-08-31 LAB — BASIC METABOLIC PANEL
Anion gap: 14 (ref 5–15)
BUN: 19 mg/dL (ref 8–23)
CO2: 25 mmol/L (ref 22–32)
Calcium: 9.1 mg/dL (ref 8.9–10.3)
Chloride: 95 mmol/L — ABNORMAL LOW (ref 98–111)
Creatinine, Ser: 1.33 mg/dL — ABNORMAL HIGH (ref 0.44–1.00)
GFR calc Af Amer: 40 mL/min — ABNORMAL LOW (ref >60)
GFR calc non Af Amer: 35 mL/min — ABNORMAL LOW (ref >60)
Glucose, Bld: 240 mg/dL — ABNORMAL HIGH (ref 70–99)
Potassium: 3.6 mmol/L (ref 3.5–5.1)
Sodium: 134 mmol/L — ABNORMAL LOW (ref 135–145)

## 2019-08-31 LAB — CBC
HCT: 30.3 % — ABNORMAL LOW (ref 36.0–46.0)
Hemoglobin: 10 g/dL — ABNORMAL LOW (ref 12.0–15.0)
MCH: 30.1 pg (ref 26.0–34.0)
MCHC: 33 g/dL (ref 30.0–36.0)
MCV: 91.3 fL (ref 80.0–100.0)
Platelets: 223 10*3/uL (ref 150–400)
RBC: 3.32 MIL/uL — ABNORMAL LOW (ref 3.87–5.11)
RDW: 13.5 % (ref 11.5–15.5)
WBC: 18.9 10*3/uL — ABNORMAL HIGH (ref 4.0–10.5)
nRBC: 0 % (ref 0.0–0.2)

## 2019-08-31 LAB — MAGNESIUM: Magnesium: 1.7 mg/dL (ref 1.7–2.4)

## 2019-08-31 MED ORDER — MAGNESIUM SULFATE 4 GM/100ML IV SOLN
4.0000 g | Freq: Once | INTRAVENOUS | Status: AC
  Administered 2019-08-31: 4 g via INTRAVENOUS
  Filled 2019-08-31: qty 100

## 2019-08-31 MED ORDER — ENOXAPARIN SODIUM 30 MG/0.3ML ~~LOC~~ SOLN
30.0000 mg | SUBCUTANEOUS | Status: DC
  Administered 2019-08-31 – 2019-09-02 (×3): 30 mg via SUBCUTANEOUS
  Filled 2019-08-31 (×3): qty 0.3

## 2019-08-31 MED ORDER — POTASSIUM CHLORIDE 20 MEQ PO PACK
40.0000 meq | PACK | Freq: Once | ORAL | Status: AC
  Administered 2019-08-31: 40 meq via ORAL
  Filled 2019-08-31: qty 2

## 2019-08-31 MED ORDER — WARFARIN SODIUM 7.5 MG PO TABS
7.5000 mg | ORAL_TABLET | Freq: Once | ORAL | Status: AC
  Administered 2019-08-31: 7.5 mg via ORAL
  Filled 2019-08-31 (×2): qty 1
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
## 2019-08-31 NOTE — Progress Notes (Signed)
ANTICOAGULATION CONSULT NOTE  Pharmacy Consult for Warfarin Indication: atrial fibrillation  No Known Allergies  Patient Measurements: Height: 5\' 7"  (170.2 cm) Weight: 99.8 kg (220 lb) IBW/kg (Calculated) : 61.6  Vital Signs: Temp: 98.8 F (37.1 C) (05/13 0757) Temp Source: Oral (05/13 0757) BP: 137/68 (05/13 0757) Pulse Rate: 76 (05/13 0757)  Labs: Recent Labs    08/29/19 1817 08/29/19 1817 08/30/19 0310 08/31/19 0829  HGB 11.7*   < > 10.9* 10.0*  HCT 36.4  --  33.4* 30.3*  PLT 251  --  239 223  APTT  --   --  39*  --   LABPROT 29.4*  --  29.4* 17.4*  INR 2.9*  --  2.9* 1.5*  CREATININE 1.41*  --   --  1.33*  TROPONINIHS  --   --  13  --    < > = values in this interval not displayed.    Estimated Creatinine Clearance: 33.4 mL/min (A) (by C-G formula based on SCr of 1.33 mg/dL (H)).  Assessment: 86 year-old female with history of atrial fibrillation, on coumadin prior to admission that is managed by a coumadin clinic, presents from home after a fall 5/10 resulting in a nondisplaced right medial tibial plateau fracture that is treated nonoperatively.   Received Vitamin K 5/11  INR on admission 2.9, INR today 1.5  Home warfarin regimen: 5 mg daily.  Goal of Therapy:  INR 2-3 Monitor platelets by anticoagulation protocol: Yes   Plan:  Warfarin 7.5 mg PO x1 today Daily INR, CBC Monitor for signs and symptoms of bleeding  Thank you 7/11, PharmD 08/31/19      9:56 AM  Please check AMION for all [facility] Pharmacy phone numbers After 10:00 PM, call the Main Pharmacy [phone]

## 2019-08-31 NOTE — Plan of Care (Signed)
  Problem: Education: Goal: Knowledge of General Education information will improve Description: Including pain rating scale, medication(s)/side effects and non-pharmacologic comfort measures Outcome: Progressing   Problem: Health Behavior/Discharge Planning: Goal: Ability to manage health-related needs will improve Outcome: Progressing   Problem: Activity: Goal: Risk for activity intolerance will decrease Outcome: Progressing   Problem: Coping: Goal: Level of anxiety will decrease Outcome: Progressing   Problem: Safety: Goal: Ability to remain free from injury will improve Outcome: Progressing   Problem: Skin Integrity: Goal: Risk for impaired skin integrity will decrease Outcome: Progressing   

## 2019-08-31 NOTE — Plan of Care (Signed)
  Problem: Education: Goal: Knowledge of General Education information will improve Description: Including pain rating scale, medication(s)/side effects and non-pharmacologic comfort measures Outcome: Progressing   Problem: Pain Managment: Goal: General experience of comfort will improve Outcome: Progressing   Problem: Safety: Goal: Ability to remain free from injury will improve Outcome: Progressing   

## 2019-08-31 NOTE — TOC Initial Note (Signed)
Transition of Care [facility]) - Initial/Assessment Note    Patient Details  Name: Susan Potter MRN: [id number] Date of Birth: [birth year]  Transition of Care [facility]) CM/SW Contact:    Susan Lesches, RN Phone Number: 08/31/2019, 3:05 PM  Clinical Narrative:  Presented s/p fall. Suffered R tibial plateau fx, to be managed conservatively. PMH : a fib, DM, HTN. From home alone, however @ d/c son Susan Potter states pt will have 24/7 supervision and assistance. Pt/ family  declined SNF placement. Pt agreeable to [facility] services. Choice list provided and [facility] selected and [facility]. NCM noted DME needs....referral made with [facility].    TOC following and monitoring for needs ....    Expected Discharge Plan: Home w Home Health Services     Patient Goals and CMS Choice     Choice offered to / list presented to : Patient  Expected Discharge Plan and Services Expected Discharge Plan: Home w Home Health Services   Discharge Planning Services: CM Consult Post Acute Care Choice: Durable Medical Equipment, Home Health Living arrangements for the past 2 months: Single Family Home                 DME Arranged: Hospital bed DME Agency: [employer] Date DME Agency Contacted: 08/31/19 Time DME Agency Contacted: 1238 Representative spoke with at DME Agency: Susan Potter HH Arranged: RN, OT, PT HH Agency: [facility] Date HH Agency Contacted: 08/31/19 Time HH Agency Contacted: 1239 Representative spoke with at [facility] Agency: Susan Potter  Prior Living Arrangements/Services Living arrangements for the past 2 months: Single Family Home Lives with:: Self                   Activities of Daily Living Home Assistive Devices/Equipment: None ADL Screening (condition at time of admission) Patient's cognitive ability adequate to safely complete daily activities?: Yes Is the patient deaf or have difficulty hearing?: Yes Does the patient have difficulty seeing, even when wearing  glasses/contacts?: No Does the patient have difficulty concentrating, remembering, or making decisions?: No Patient able to express need for assistance with ADLs?: Yes Does the patient have difficulty dressing or bathing?: No Independently performs ADLs?: Yes (appropriate for developmental age) Does the patient have difficulty walking or climbing stairs?: No Weakness of Legs: None Weakness of Arms/Hands: None  Permission Sought/Granted                  Emotional Assessment              Admission diagnosis:  Hematoma [T14.8XXA] Fracture of right tibial plateau, closed, initial encounter [S82.141A] Closed fracture of proximal end of right tibia, unspecified fracture morphology, initial encounter [S82.101A] Patient Active Problem List   Diagnosis Date Noted  . Hypomagnesemia 08/30/2019  . Acute lower UTI 08/30/2019  . Vitamin D deficiency 08/30/2019  . AF (paroxysmal atrial fibrillation) (HCC) 08/29/2019  . Type 2 diabetes mellitus without complication, without long-term current use of insulin (HCC) 08/29/2019  . Mixed diabetic hyperlipidemia associated with type 2 diabetes mellitus (HCC) 08/29/2019  . Essential hypertension 08/29/2019  . Fall at home, initial encounter 08/29/2019  . Tibial plateau fracture, right, closed, initial encounter 08/29/2019  . Fracture of right tibial plateau, closed, initial encounter 08/29/2019   PCP:  Susan Littler, MD Pharmacy:   [facility] [phone] [place], [place] - [phone] AT [facility] [address] & [address] [place] [address] Phone: [phone] Fax: [phone]     Social Determinants of Health (  SDOH) Interventions    Readmission Risk Interventions No flowsheet data found.

## 2019-08-31 NOTE — Progress Notes (Signed)
Physical Therapy Treatment Patient Details Name: Susan Potter MRN: [id number] DOB: August 03, 1926 Today's Date: 08/31/2019    History of Present Illness Pt is a 86 y/o female admitted following fall. Found to have R tibial plateau fx, to be managed conservatively. PMH includes a fib, DM, HTN.     PT Comments    Pt continues to require max +2 external assistance to mobilize.  She did respond well to slide board transfer from high to low surface and she was able to maintain weight bearing with this method.  She remains to present with poor activity tolerance and decreased strength and would truly benefit from snf placement for rehab.  Will continue to follow patient acutely.    Follow Up Recommendations  SNF;Supervision/Assistance - 24 hour(max HH services if family decides to take pt home.)     Equipment Recommendations  Wheelchair (measurements PT);Wheelchair cushion (measurements PT)(sliding board.)    Recommendations for Other Services       Precautions / Restrictions Precautions Precautions: Fall Required Braces or Orthoses: Other Brace Other Brace: hinged knee brace RLE locked in extension.  Restrictions Weight Bearing Restrictions: Yes RLE Weight Bearing: Non weight bearing    Mobility  Bed Mobility Overal bed mobility: Needs Assistance Bed Mobility: Supine to Sit     Supine to sit: Mod assist;+2 for physical assistance     General bed mobility comments: Mod +2, assistance to move RLE to edge of bed and scoot hips to edge of bed with bed pad.  Pt able to assist in trunk elevation well.  Transfers Overall transfer level: Needs assistance Equipment used: Sliding board Transfers: Lateral/Scoot Transfers          Lateral/Scoot Transfers: Max assist;+2 physical assistance General transfer comment: Max +2 from higher to lower surface.  Pt required assistance to move pad across slide board.  She was able to follow commands for hand placement but still offering very  little assistance on her behalf.  Ambulation/Gait                 Stairs             Wheelchair Mobility    Modified Rankin (Stroke Patients Only)       Balance Overall balance assessment: Needs assistance Sitting-balance support: No upper extremity supported;Feet supported Sitting balance-Leahy Scale: Fair                                      Cognition Arousal/Alertness: Awake/alert Behavior During Therapy: WFL for tasks assessed/performed Overall Cognitive Status: Within Functional Limits for tasks assessed                                 General Comments: Pt not formally assessed but within normal limits during tx and following commands well.      Exercises      General Comments        Pertinent Vitals/Pain Pain Assessment: Faces Faces Pain Scale: Hurts even more Pain Location: R knee  Pain Descriptors / Indicators: Grimacing;Guarding Pain Intervention(s): Monitored during session;Repositioned    Home Living                      Prior Function            PT Goals (current goals can now be found in the care plan  section) Acute Rehab PT Goals Patient Stated Goal: for pt to go home per granddaughter PT Goal Formulation: With patient Potential to Achieve Goals: Good Progress towards PT goals: Progressing toward goals    Frequency    Min 3X/week      PT Plan Current plan remains appropriate    Co-evaluation              AM-PAC PT "6 Clicks" Mobility   Outcome Measure  Help needed turning from your back to your side while in a flat bed without using bedrails?: Total Help needed moving from lying on your back to sitting on the side of a flat bed without using bedrails?: Total Help needed moving to and from a bed to a chair (including a wheelchair)?: Total Help needed standing up from a chair using your arms (e.g., wheelchair or bedside chair)?: Total Help needed to walk in hospital room?:  Total Help needed climbing 3-5 steps with a railing? : Total 6 Click Score: 6    End of Session Equipment Utilized During Treatment: Gait belt Activity Tolerance: Patient tolerated treatment well Patient left: with family/visitor present;in chair;with call bell/phone within reach;with chair alarm set Nurse Communication: Mobility status;Need for lift equipment(informed nursing to use maximove sling for back to bed transfer.) PT Visit Diagnosis: Difficulty in walking, not elsewhere classified (R26.2);Muscle weakness (generalized) (M62.81);Unsteadiness on feet (R26.81);History of falling (Z91.81);Pain Pain - Right/Left: Right Pain - part of body: Knee     Time: 6433-2951 PT Time Calculation (min) (ACUTE ONLY): 18 min  Charges:  $Therapeutic Activity: 8-22 mins                     Bonney Leitz , PTA Acute Rehabilitation Services Pager [phone] Office [phone]     Hurbert Duran Artis Delay 08/31/2019, 2:13 PM

## 2019-09-01 LAB — BASIC METABOLIC PANEL
Anion gap: 10 (ref 5–15)
BUN: 29 mg/dL — ABNORMAL HIGH (ref 8–23)
CO2: 27 mmol/L (ref 22–32)
Calcium: 9.8 mg/dL (ref 8.9–10.3)
Chloride: 96 mmol/L — ABNORMAL LOW (ref 98–111)
Creatinine, Ser: 1.61 mg/dL — ABNORMAL HIGH (ref 0.44–1.00)
GFR calc Af Amer: 32 mL/min — ABNORMAL LOW (ref >60)
GFR calc non Af Amer: 28 mL/min — ABNORMAL LOW (ref >60)
Glucose, Bld: 183 mg/dL — ABNORMAL HIGH (ref 70–99)
Potassium: 4.1 mmol/L (ref 3.5–5.1)
Sodium: 133 mmol/L — ABNORMAL LOW (ref 135–145)

## 2019-09-01 LAB — PROTIME-INR
INR: 1.5 — ABNORMAL HIGH (ref 0.8–1.2)
Prothrombin Time: 17.8 seconds — ABNORMAL HIGH (ref 11.4–15.2)

## 2019-09-01 LAB — CBC
HCT: 29.4 % — ABNORMAL LOW (ref 36.0–46.0)
Hemoglobin: 9.6 g/dL — ABNORMAL LOW (ref 12.0–15.0)
MCH: 29.6 pg (ref 26.0–34.0)
MCHC: 32.7 g/dL (ref 30.0–36.0)
MCV: 90.7 fL (ref 80.0–100.0)
Platelets: 249 10*3/uL (ref 150–400)
RBC: 3.24 MIL/uL — ABNORMAL LOW (ref 3.87–5.11)
RDW: 13.6 % (ref 11.5–15.5)
WBC: 16.4 10*3/uL — ABNORMAL HIGH (ref 4.0–10.5)
nRBC: 0 % (ref 0.0–0.2)

## 2019-09-01 LAB — GLUCOSE, CAPILLARY
Glucose-Capillary: 180 mg/dL — ABNORMAL HIGH (ref 70–99)
Glucose-Capillary: 175 mg/dL — ABNORMAL HIGH (ref 70–99)
Glucose-Capillary: 134 mg/dL — ABNORMAL HIGH (ref 70–99)
Glucose-Capillary: 216 mg/dL — ABNORMAL HIGH (ref 70–99)

## 2019-09-01 LAB — URINE CULTURE: Culture: >=100000 — AB

## 2019-09-01 LAB — MAGNESIUM: Magnesium: 2.8 mg/dL — ABNORMAL HIGH (ref 1.7–2.4)

## 2019-09-01 MED ORDER — CEFDINIR 300 MG PO CAPS: 300.0000 mg | ORAL_CAPSULE | Freq: Two times a day (BID) | ORAL | Status: DC

## 2019-09-01 MED ORDER — WARFARIN SODIUM 7.5 MG PO TABS
7.5000 mg | ORAL_TABLET | Freq: Once | ORAL | Status: AC
  Administered 2019-09-01: 7.5 mg via ORAL
  Filled 2019-09-01: qty 1

## 2019-09-01 MED ORDER — CEFDINIR 300 MG PO CAPS
300.0000 mg | ORAL_CAPSULE | Freq: Every day | ORAL | Status: AC
  Administered 2019-09-01 – 2019-09-03 (×3): 300 mg via ORAL
  Filled 2019-09-01 (×3): qty 1

## 2019-09-01 NOTE — Progress Notes (Signed)
ANTICOAGULATION CONSULT NOTE  Pharmacy Consult for Warfarin Indication: atrial fibrillation  No Known Allergies  Patient Measurements: Height: 5\' 7"  (170.2 cm) Weight: 99.8 kg (220 lb) IBW/kg (Calculated) : 61.6  Vital Signs: Temp: 99.1 F (37.3 C) (05/14 0714) Temp Source: Oral (05/14 0714) BP: 125/70 (05/14 0714) Pulse Rate: 85 (05/14 0714)  Labs: Recent Labs    08/29/19 1817 08/29/19 1817 08/30/19 0310 08/30/19 0310 08/31/19 0829 09/01/19 0340  HGB 11.7*   < > 10.9*   < > 10.0* 9.6*  HCT 36.4   < > 33.4*  --  30.3* 29.4*  PLT 251   < > 239  --  223 249  APTT  --   --  39*  --   --   --   LABPROT 29.4*   < > 29.4*  --  17.4* 17.8*  INR 2.9*   < > 2.9*  --  1.5* 1.5*  CREATININE 1.41*  --   --   --  1.33* 1.61*  TROPONINIHS  --   --  13  --   --   --    < > = values in this interval not displayed.    Estimated Creatinine Clearance: 27.6 mL/min (A) (by C-G formula based on SCr of 1.61 mg/dL (H)).  Assessment: 86 year-old female with history of atrial fibrillation, on coumadin prior to admission that is managed by a coumadin clinic, presents from home after a fall 5/10 resulting in a nondisplaced right medial tibial plateau fracture that is treated nonoperatively.   Received Vitamin K 5/11  INR on admission 2.9, INR today 1.5 again  Home warfarin regimen: 5 mg daily.  Goal of Therapy:  INR 2-3 Monitor platelets by anticoagulation protocol: Yes   Plan:  Repeat Warfarin 7.5 mg PO x1 today Daily INR, CBC Monitor for signs and symptoms of bleeding  Thank you 7/11, PharmD 09/01/19      9:09 AM  Please check AMION for all [facility] Pharmacy phone numbers After 10:00 PM, call the Main Pharmacy [phone]

## 2019-09-01 NOTE — NC FL2 (Addendum)
[place] MEDICAID FL2 LEVEL OF CARE SCREENING TOOL     IDENTIFICATION  Patient Name: Susan Potter Birthdate: Sep 09, 1926 Sex: female Admission Date (Current Location): 08/29/2019  [facility] and [place] Number:  Producer, television/film/video and Address:  [facility] [place], [address], [place], [place] [address]      Provider Number: [id number]  Attending Physician Name and Address:  Rodolph Bong, MD  Relative Name and Phone Number:       Current Level of Care: Hospital Recommended Level of Care: Skilled Nursing Facility Prior Approval Number:    Date Approved/Denied:   PASRR Number:  [id number]  Discharge Plan: SNF    Current Diagnoses: Patient Active Problem List   Diagnosis Date Noted  . Hypomagnesemia 08/30/2019  . Acute lower UTI 08/30/2019  . Vitamin D deficiency 08/30/2019  . AF (paroxysmal atrial fibrillation) (HCC) 08/29/2019  . Type 2 diabetes mellitus without complication, without long-term current use of insulin (HCC) 08/29/2019  . Mixed diabetic hyperlipidemia associated with type 2 diabetes mellitus (HCC) 08/29/2019  . Essential hypertension 08/29/2019  . Fall at home, initial encounter 08/29/2019  . Tibial plateau fracture, right, closed, initial encounter 08/29/2019  . Fracture of right tibial plateau, closed, initial encounter 08/29/2019    Orientation RESPIRATION BLADDER Height & Weight     Self, Time, Situation, Place  Normal Continent Weight: 99.8 kg Height:  5\' 7"  (170.2 cm)  BEHAVIORAL SYMPTOMS/MOOD NEUROLOGICAL BOWEL NUTRITION STATUS      Continent Diet(Refer to d/c summary)  AMBULATORY STATUS COMMUNICATION OF NEEDS Skin   Extensive Assist Verbally Other (Comment)(ecchymosis to face and legs)                       Personal Care Assistance Level of Assistance  Bathing, Dressing, Feeding Bathing Assistance: Maximum assistance Feeding assistance: Independent Dressing Assistance: Maximum assistance     Functional  Limitations Info  Sight, Speech, Hearing Sight Info: Adequate Hearing Info: Adequate Speech Info: Adequate    SPECIAL CARE FACTORS FREQUENCY  PT (By licensed PT), OT (By licensed OT)     PT Frequency: 5x/ week, evaluate and treat OT Frequency: 5x/ week, evaluate and treat            Contractures Contractures Info: Not present    Additional Factors Info  Code Status, Allergies Code Status Info: Full Code Allergies Info: No Known Allergies           Current Medications (09/01/2019):  This is the current hospital active medication list Current Facility-Administered Medications  Medication Dose Route Frequency Provider Last Rate Last Admin  . acetaminophen (TYLENOL) tablet 650 mg  650 mg Oral Q6H PRN 09/03/2019, MD   650 mg at 09/01/19 1005   Or  . acetaminophen (TYLENOL) suppository 650 mg  650 mg Rectal Q6H PRN 09/03/19, MD      . atorvastatin (LIPITOR) tablet 10 mg  10 mg Oral Daily Shalhoub, Marinda Elk, MD   10 mg at 09/01/19 1005  . bisoprolol (ZEBETA) tablet 10 mg  10 mg Oral Daily Shalhoub, 09/03/19, MD   10 mg at 09/01/19 1005  . calcium-vitamin D (OSCAL WITH D) 500-200 MG-UNIT per tablet 1 tablet  1 tablet Oral TID 09/03/19, MD   1 tablet at 09/01/19 1005  . cefdinir (OMNICEF) capsule 300 mg  300 mg Oral Q2000 [place], 09/03/19, Lynita Lombard      . enoxaparin (LOVENOX) injection 30 mg  30 mg Subcutaneous Q24H Eugenie Filler, MD   30 mg at 08/31/19 1303  . insulin aspart (novoLOG) injection 0-15 Units  0-15 Units Subcutaneous TID AC & HS Shalhoub, Sherryll Burger, MD   3 Units at 09/01/19 0845  . ondansetron (ZOFRAN) tablet 4 mg  4 mg Oral Q6H PRN Shalhoub, Sherryll Burger, MD       Or  . ondansetron (ZOFRAN) injection 4 mg  4 mg Intravenous Q6H PRN Shalhoub, Sherryll Burger, MD      . polyethylene glycol (MIRALAX / GLYCOLAX) packet 17 g  17 g Oral Daily PRN Shalhoub, Sherryll Burger, MD      . traMADol Veatrice Bourbon) tablet 50 mg  50 mg Oral Q6H PRN Shalhoub, Sherryll Burger, MD        Or  . traMADol Veatrice Bourbon) tablet 100 mg  100 mg Oral Q6H PRN Vernelle Emerald, MD      . warfarin (COUMADIN) tablet 7.5 mg  7.5 mg Oral ONCE-1600 Eugenie Filler, MD      . Warfarin - Pharmacist Dosing Inpatient   Does not apply q1600 Eugenie Filler, MD   Given at 08/30/19 1644     Discharge Medications: Please see discharge summary for a list of discharge medications.  Relevant Imaging Results:  Relevant Lab Results:   Additional Information SS# [id number]  Sharin Mons, RN

## 2019-09-01 NOTE — Progress Notes (Signed)
Physical Therapy Treatment Patient Details Name: Susan Potter MRN: [id number] DOB: 16-May-1926 Today's Date: 09/01/2019    History of Present Illness Pt is a 86 y/o female admitted following fall. Found to have R tibial plateau fx, to be managed conservatively. PMH includes a fib, DM, HTN.     PT Comments    Pt supine in bed on arrival but appears uncomfortable.  Upon further investigation matress on bed has slid down.  PTA repositioned mattress and patient and provided bolster to low back for support.  Once positioned performed LLE strengthening and RLE limted ROM with ankle pumps and hip abduction.  Brace appeared to be cutting into patient from edema and PTA resized and refit brace for comfort.  RN aware of indentions on patients leg but no skin integrity is compromised.  Continue to recommend SNF placement ar d/c.      Follow Up Recommendations  SNF;Supervision/Assistance - 24 hour     Equipment Recommendations  Wheelchair (measurements PT);Wheelchair cushion (measurements PT)(sliding board.)    Recommendations for Other Services       Precautions / Restrictions Precautions Precautions: Fall Required Braces or Orthoses: Other Brace Other Brace: hinged knee brace RLE locked in extension.  Restrictions Weight Bearing Restrictions: Yes RLE Weight Bearing: Non weight bearing    Mobility  Bed Mobility Overal bed mobility: Needs Assistance Bed Mobility: Supine to Sit(into long sitting.)           General bed mobility comments: Pt performed supine to sit via long sitting with min assistance for trunk.  Focused on positioning in bed as patient presents with back pain.  Pt reports feeling better once positioned improved.  Pt in modified chair position post session.  Transfers Overall transfer level: (deferred.)                  Ambulation/Gait                 Stairs             Wheelchair Mobility    Modified Rankin (Stroke Patients Only)       Balance Overall balance assessment: Needs assistance Sitting-balance support: No upper extremity supported;Feet supported Sitting balance-Leahy Scale: Fair Sitting balance - Comments: in long sitting.                                    Cognition Arousal/Alertness: Awake/alert Behavior During Therapy: WFL for tasks assessed/performed Overall Cognitive Status: Within Functional Limits for tasks assessed                                 General Comments: Pt not formally assessed but within normal limits during tx and following commands well.      Exercises General Exercises - Lower Extremity Ankle Circles/Pumps: AROM;Both;10 reps;Supine Quad Sets: AROM;Left;10 reps;Supine Heel Slides: AROM;Left;10 reps;Supine Hip ABduction/ADduction: AROM;AAROM;Both;10 reps;Supine Straight Leg Raises: AROM;Left;10 reps;Supine    General Comments        Pertinent Vitals/Pain Pain Assessment: Faces Faces Pain Scale: Hurts even more Pain Location: R knee  Pain Descriptors / Indicators: Grimacing;Guarding Pain Intervention(s): Monitored during session;Repositioned    Home Living                      Prior Function            PT Goals (current  goals can now be found in the care plan section) Acute Rehab PT Goals Patient Stated Goal: for pt to go home per granddaughter PT Goal Formulation: With patient Potential to Achieve Goals: Good Progress towards PT goals: Progressing toward goals    Frequency    Min 3X/week      PT Plan Current plan remains appropriate    Co-evaluation              AM-PAC PT "6 Clicks" Mobility   Outcome Measure  Help needed turning from your back to your side while in a flat bed without using bedrails?: Total Help needed moving from lying on your back to sitting on the side of a flat bed without using bedrails?: Total Help needed moving to and from a bed to a chair (including a wheelchair)?: Total Help needed  standing up from a chair using your arms (e.g., wheelchair or bedside chair)?: Total Help needed to walk in hospital room?: Total Help needed climbing 3-5 steps with a railing? : Total 6 Click Score: 6    End of Session Equipment Utilized During Treatment: Gait belt Activity Tolerance: Patient tolerated treatment well Patient left: with family/visitor present;in chair;with call bell/phone within reach;with chair alarm set Nurse Communication: Mobility status(informed nursing that bledsoe was too tight and RN into observe.) PT Visit Diagnosis: Difficulty in walking, not elsewhere classified (R26.2);Muscle weakness (generalized) (M62.81);Unsteadiness on feet (R26.81);History of falling (Z91.81);Pain Pain - part of body: Knee     Time: 8938-1017 PT Time Calculation (min) (ACUTE ONLY): 30 min  Charges:  $Therapeutic Exercise: 8-22 mins $Therapeutic Activity: 8-22 mins                     Bonney Leitz , PTA Acute Rehabilitation Services Pager [phone] Office [phone]     Railey Glad Artis Delay 09/01/2019, 5:09 PM
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
## 2019-09-01 NOTE — TOC Progression Note (Signed)
Transition of Care [facility]) - Progression Note    Patient Details  Name: Susan Potter MRN: [id number] Date of Birth: 29-Aug-1926  Transition of [facility]) CM/SW Contact  Epifanio Lesches, RN Phone Number: 09/01/2019, 12:54 PM  Clinical Narrative:    Bed offer for SNF placement with [facility] extended and pt accepted.  Awaiting insurance authorization. Last noted COVID ok with facitlity if d/c on tomorrow. Pt fully vaccinated. TOC team will continue to monitor .....    Expected Discharge Plan: Skilled Nursing Facility Barriers to Discharge: Insurance Authorization  Expected Discharge Plan and Services Expected Discharge Plan: Skilled Nursing Facility   Discharge Planning Services: CM Consult Post Acute Care Choice: Durable Medical Equipment, Home Health Living arrangements for the past 2 months: Single Family Home                 DME Arranged: Hospital bed DME Agency: [employer] Date DME Agency Contacted: 08/31/19 Time DME Agency Contacted: 1238 Representative spoke with at DME Agency: Zack HH Arranged: RN, OT, PT HH Agency: [facility] Date [facility] Agency Contacted: 08/31/19 Time HH Agency Contacted: 1239 Representative spoke with at [facility] Agency: Cassie   Social Determinants of Health (SDOH) Interventions    Readmission Risk Interventions No flowsheet data found.

## 2019-09-01 NOTE — TOC Progression Note (Signed)
Transition of Care [facility]) - Progression Note    Patient Details  Name: Susan Potter MRN: [id number] Date of Birth: Mar 12, 1927  Transition of Care [facility]) CM/SW Contact  Epifanio Lesches, RN Phone Number: [phone] 09/01/2019, 9:41 AM  Clinical Narrative:    NCM @ pt's bedside with pt and  daughter [name]. Pt now states she's open to going to SNF/rehab. vs home with home health services 2/2 current physical needs and fall risk. NCM shared with patient and daughter and  PT recommendation of SNF placement at time of discharge.   Patient expressed understanding of PT recommendation and is agreeable to SNF placement at time of discharge. Patient reports preference for [facility] as 1st choice, [facility] 2nd choice .NCM discussed insurance authorization process and provided Medicare SNF ratings list. Patient expressed being hopeful for rehab and to feel better soon. No further questions reported at this time. NCM to continue to follow and assist with discharge planning needs.   Expected Discharge Plan: Skilled Nursing Facility Barriers to Discharge: No Barriers Identified, No SNF bed, Insurance Authorization(updated COVID)  Expected Discharge Plan and Services Expected Discharge Plan: Skilled Nursing Facility   Discharge Planning Services: CM Consult Post Acute Care Choice: Durable Medical Equipment, Home Health Living arrangements for the past 2 months: Single Family Home                 DME Arranged: Hospital bed DME Agency: [employer] Date DME Agency Contacted: 08/31/19 Time DME Agency Contacted: 1238 Representative spoke with at DME Agency: Zack HH Arranged: RN, OT, PT HH Agency: [facility] Date [facility] Agency Contacted: 08/31/19 Time HH Agency Contacted: 1239 Representative spoke with at [facility] Agency: Cassie   Social Determinants of Health (SDOH) Interventions    Readmission Risk Interventions No flowsheet data found.

## 2019-09-01 NOTE — Plan of Care (Signed)

## 2019-09-02 LAB — CBC WITH DIFFERENTIAL/PLATELET
Abs Immature Granulocytes: 0.08 10*3/uL — ABNORMAL HIGH (ref 0.00–0.07)
Basophils Absolute: 0 10*3/uL (ref 0.0–0.1)
Basophils Relative: 0 %
Eosinophils Absolute: 0.3 10*3/uL (ref 0.0–0.5)
Eosinophils Relative: 2 %
HCT: 26.2 % — ABNORMAL LOW (ref 36.0–46.0)
Hemoglobin: 8.6 g/dL — ABNORMAL LOW (ref 12.0–15.0)
Immature Granulocytes: 1 %
Lymphocytes Relative: 8 %
Lymphs Abs: 1 10*3/uL (ref 0.7–4.0)
MCH: 30 pg (ref 26.0–34.0)
MCHC: 32.8 g/dL (ref 30.0–36.0)
MCV: 91.3 fL (ref 80.0–100.0)
Monocytes Absolute: 1.2 10*3/uL — ABNORMAL HIGH (ref 0.1–1.0)
Monocytes Relative: 9 %
Neutro Abs: 10.3 10*3/uL — ABNORMAL HIGH (ref 1.7–7.7)
Neutrophils Relative %: 80 %
Platelets: 251 10*3/uL (ref 150–400)
RBC: 2.87 MIL/uL — ABNORMAL LOW (ref 3.87–5.11)
RDW: 13.4 % (ref 11.5–15.5)
WBC: 12.9 10*3/uL — ABNORMAL HIGH (ref 4.0–10.5)
nRBC: 0 % (ref 0.0–0.2)

## 2019-09-02 LAB — BASIC METABOLIC PANEL
Anion gap: 8 (ref 5–15)
BUN: 35 mg/dL — ABNORMAL HIGH (ref 8–23)
CO2: 28 mmol/L (ref 22–32)
Calcium: 9.6 mg/dL (ref 8.9–10.3)
Chloride: 97 mmol/L — ABNORMAL LOW (ref 98–111)
Creatinine, Ser: 1.5 mg/dL — ABNORMAL HIGH (ref 0.44–1.00)
GFR calc Af Amer: 35 mL/min — ABNORMAL LOW (ref >60)
GFR calc non Af Amer: 30 mL/min — ABNORMAL LOW (ref >60)
Glucose, Bld: 194 mg/dL — ABNORMAL HIGH (ref 70–99)
Potassium: 4.2 mmol/L (ref 3.5–5.1)
Sodium: 133 mmol/L — ABNORMAL LOW (ref 135–145)

## 2019-09-02 LAB — GLUCOSE, CAPILLARY
Glucose-Capillary: 153 mg/dL — ABNORMAL HIGH (ref 70–99)
Glucose-Capillary: 209 mg/dL — ABNORMAL HIGH (ref 70–99)
Glucose-Capillary: 142 mg/dL — ABNORMAL HIGH (ref 70–99)
Glucose-Capillary: 168 mg/dL — ABNORMAL HIGH (ref 70–99)

## 2019-09-02 LAB — PROTIME-INR
INR: 2 — ABNORMAL HIGH (ref 0.8–1.2)
Prothrombin Time: 21.7 seconds — ABNORMAL HIGH (ref 11.4–15.2)

## 2019-09-02 MED ORDER — PANTOPRAZOLE SODIUM 40 MG PO TBEC
40.0000 mg | DELAYED_RELEASE_TABLET | Freq: Two times a day (BID) | ORAL | Status: DC
  Administered 2019-09-02 – 2019-09-04 (×5): 40 mg via ORAL
  Filled 2019-09-02 (×5): qty 1

## 2019-09-02 MED ORDER — WARFARIN SODIUM 5 MG PO TABS
5.0000 mg | ORAL_TABLET | Freq: Once | ORAL | Status: AC
  Administered 2019-09-02: 5 mg via ORAL
  Filled 2019-09-02: qty 1

## 2019-09-02 NOTE — Progress Notes (Addendum)
ANTICOAGULATION CONSULT NOTE  Pharmacy Consult for Warfarin Indication: atrial fibrillation  No Known Allergies  Patient Measurements: Height: 5\' 7"  (170.2 cm) Weight: 99.8 kg (220 lb) IBW/kg (Calculated) : 61.6  Vital Signs: Temp: 98.4 F (36.9 C) (05/15 0740) Temp Source: Oral (05/15 0740) BP: 133/58 (05/15 0740) Pulse Rate: 76 (05/15 0740)  Labs: Recent Labs    08/31/19 0829 08/31/19 0829 09/01/19 0340 09/02/19 0450  HGB 10.0*   < > 9.6* 8.6*  HCT 30.3*  --  29.4* 26.2*  PLT 223  --  249 251  LABPROT 17.4*  --  17.8* 21.7*  INR 1.5*  --  1.5* 2.0*  CREATININE 1.33*  --  1.61* 1.50*   < > = values in this interval not displayed.    Estimated Creatinine Clearance: 29.7 mL/min (A) (by C-G formula based on SCr of 1.5 mg/dL (H)).  Assessment: 86 year-old female with history of atrial fibrillation, on coumadin prior to admission that is managed by a coumadin clinic, presents from home after a fall 5/10 resulting in a nondisplaced right medial tibial plateau fracture that is treated nonoperatively.   Received Vitamin K 2 mg IV 5/12  INR on admission 2.9, INR today therapeutic on low end at 2.0. Hgb down 8.6, plt wnl. Patient is also on enoxaparin 30 mg daily.   Home warfarin regimen: 5 mg daily.  Goal of Therapy:  INR 2-3 Monitor platelets by anticoagulation protocol: Yes   Plan:  Warfarin 5 mg daily Daily INR Monitor for signs and symptoms of bleeding  7/12, PharmD PGY1 Pharmacy Resident  Please check AMION for all [facility] phone numbers After 10:00 PM, call Main Pharmacy [phone]  09/02/19      8:16 AM

## 2019-09-02 NOTE — Progress Notes (Signed)
PROGRESS NOTE    Susan Potter  ZYS:[id number] DOB: 1926/11/09 DOA: 08/29/2019 PCP: [name], MD   Chief Complaint  Patient presents with  . Fall    Brief Narrative:  HPI per Dr. Leafy Half 86 year old female with past medical history of paroxysmal atrial fibrillation, diabetes mellitus type 2, vitamin D deficiency, osteoporosis, hypertension and hyperlipidemia who presents to [facility] emergency department status post fall with right lower extremity pain.  Patient explains that during the day on 5/10 the patient was walking through her backyard, working on the garden.  She bent over to pick up a flower plant and states that she tripped and fell.  She suddenly began to experience moderate to severe pain of the right knee and right lower extremity.  Patient describes his pain as aching quality, moderate to severe in intensity, without radiation and worse with weightbearing or movement of the affected extremity.  Upon further questioning patient denies chest pain, palpitations, lightheadedness or loss of consciousness surrounding this event.  The local fire department came to the patient's house and brought the patient into her home after she declined to be brought into the hospital.  Patient explains that throughout the day her symptoms continue to worsen with worsening right lower extremity pain.  Patient got to the point where she was completely unable to ambulate due to the severity of pain.  As at this point the patient presented to [facility] emergency department for evaluation.  Upon evaluation in the emergency department, imaging of the right lower extremity revealed a right tibial plateau fracture.  This was discussed with Dr. Susa Simmonds with [facility] who recommended keeping the patient n.p.o. after midnight and that him or one of his associates will see the patient in the morning for possible operative intervention.  The hospitalist group was then called  to assess patient for admission the hospital.  Assessment & Plan:   Principal Problem:   Tibial plateau fracture, right, closed, initial encounter Active Problems:   AF (paroxysmal atrial fibrillation) (HCC)   Type 2 diabetes mellitus without complication, without long-term current use of insulin (HCC)   Mixed diabetic hyperlipidemia associated with type 2 diabetes mellitus (HCC)   Essential hypertension   Fall at home, initial encounter   Fracture of right tibial plateau, closed, initial encounter   Hypomagnesemia   Acute lower UTI   Vitamin D deficiency  1 right tibial plateau fracture Secondary to mechanical fall.  Patient denies any syncopal episodes.  Patient seen by orthopedics recommended nonoperative management for this patient with hinged knee brace and nonweightbearing to the right lower extremity with outpatient follow-up.  PT/OT.  Patient has been assessed by PT and recommending SNF.  Patient initially refused SNF and wanted to go home with home health therapies.  Patient and daughter now have changed the mind and would like to go to SNF.  Social work consulted for SNF placement.   2.  Paroxysmal atrial fibrillation Patient with history of A. fib > 30 years.  Patient received some vitamin K in anticipation of possible surgery.  However patient no longer needs surgery and conservative treatment at this time per orthopedics.  INR at 2.  Continue Zebeta for rate control.  Coumadin for anticoagulation as patient now with conservative nonoperative management per orthopedics.   3.  E. coli UTI Urine cultures > 100,000 colonies of E. coli.  Pansensitive.  Patient was on IV Rocephin and subsequently transition to [facility] to complete a 5-day course of treatment.  4.  Hypomagnesemia Repleted.  Magnesium at 2.8.  Follow.  5.  Vitamin D deficiency Vitamin D 25 hydroxy at 23.61.  Os-Cal with vitamin D 1 tablet 3 times daily.  Magnesium at 2.8.  Outpatient follow-up.  6.  Diabetes  mellitus type 2 Hemoglobin A1c 7.3.  CBG of 153 this morning.  Continue to hold oral hypoglycemic agents.  Sliding scale insulin.   7.  Hypertension Patient's indapamide was held.  Continue bisoprolol.  8.  Hyperlipidemia Statin.     DVT prophylaxis: Coumadin Code Status: Full Family Communication: Updated patient and son at bedside. Disposition:   Status is: Inpatient.    Dispo: The patient is from: Home              Anticipated d/c is to: Patient and family decided SNF is a better option.  Social work consulted for SNF placement.  To SNF when bed available.                Anticipated d/c date is: 09/01/2019              Patient being transitioned from IV to oral antibiotics.  INR subtherapeutic at 2.  Patient and family have now decided SNF as a better option.        Consultants:   Orthopedics: Dr. Susa Simmonds 08/30/2019  Procedures:   2D echo pending 08/30/2019  CT head/C-spine 08/29/2019  CT right knee 08/29/2019  Chest x-ray 08/29/2019  Plain films of the right knee 08/29/2019  Plain films of the pelvis 08/29/2019  Antimicrobials:   IV Rocephin 08/30/2019>>>>> 09/01/2019  Omnicef 09/01/2019   Subjective: Patient laying in bed.  No chest pain.  No shortness of breath.  Son at bedside.   Objective: Vitals:   09/01/19 1305 09/01/19 2056 09/02/19 0657 09/02/19 0740  BP: 116/79 126/70 (!) 126/51 (!) 133/58  Pulse: 77 80 71 76  Resp: 16 17 18 17   Temp: 98.8 F (37.1 C) 98.5 F (36.9 C) 98.6 F (37 C) 98.4 F (36.9 C)  TempSrc: Oral Oral Oral Oral  SpO2: 96% 95% 99% 98%  Weight:      Height:        Intake/Output Summary (Last 24 hours) at 09/02/2019 1246 Last data filed at 09/02/2019 1100 Gross per 24 hour  Intake 600 ml  Output 1600 ml  Net -1000 ml   Filed Weights   08/29/19 1815 08/30/19 1149  Weight: 99.8 kg 99.8 kg    Examination:  General exam: NAD Respiratory system: CTAB.  No wheezes, no crackles, no rhonchi.  Cardiovascular system:  Irregularly irregular.   Gastrointestinal system: Abdomen is nontender, nondistended, soft, positive bowel sounds.  No rebound.  No guarding.  Central nervous system: Alert and oriented. No focal neurological deficits. Extremities: Right lower extremity with hinged knee brace.  Skin: No rashes, lesions or ulcers Psychiatry: Judgement and insight appear normal. Mood & affect appropriate.     Data Reviewed: I have personally reviewed following labs and imaging studies  CBC: Recent Labs  Lab 08/29/19 1817 08/30/19 0310 08/31/19 0829 09/01/19 0340 09/02/19 0450  WBC 14.3* 11.9* 18.9* 16.4* 12.9*  NEUTROABS 12.4* 9.8*  --   --  10.3*  HGB 11.7* 10.9* 10.0* 9.6* 8.6*  HCT 36.4 33.4* 30.3* 29.4* 26.2*  MCV 92.2 91.3 91.3 90.7 91.3  PLT 251 239 223 249 251    Basic Metabolic Panel: Recent Labs  Lab 08/29/19 1817 08/30/19 0310 08/31/19 0829 09/01/19 0340 09/02/19 0450  NA 133*  --  134* 133* 133*  K 4.8  --  3.6 4.1 4.2  CL 99  --  95* 96* 97*  CO2 23  --  25 27 28   GLUCOSE 216*  --  240* 183* 194*  BUN 17  --  19 29* 35*  CREATININE 1.41*  --  1.33* 1.61* 1.50*  CALCIUM 8.7*  --  9.1 9.8 9.6  MG  --  1.6* 1.7 2.8*  --     GFR: Estimated Creatinine Clearance: 29.7 mL/min (A) (by C-G formula based on SCr of 1.5 mg/dL (H)).  Liver Function Tests: No results for input(s): AST, ALT, ALKPHOS, BILITOT, PROT, ALBUMIN in the last 168 hours.  CBG: Recent Labs  Lab 09/01/19 1202 09/01/19 1643 09/01/19 2122 09/02/19 0741 09/02/19 1205  GLUCAP 175* 134* 216* 153* 209*     Recent Results (from the past 240 hour(s))  SARS Coronavirus 2 by RT PCR (hospital order, performed in [facility] hospital lab) Nasopharyngeal Nasopharyngeal Swab     Status: None   Collection Time: 08/29/19  8:16 PM   Specimen: Nasopharyngeal Swab  Result Value Ref Range Status   SARS Coronavirus 2 NEGATIVE NEGATIVE Final    Comment: (NOTE) SARS-CoV-2 target nucleic acids are NOT DETECTED. The  SARS-CoV-2 RNA is generally detectable in upper and lower respiratory specimens during the acute phase of infection. The lowest concentration of SARS-CoV-2 viral copies this assay can detect is 250 copies / mL. A negative result does not preclude SARS-CoV-2 infection and should not be used as the sole basis for treatment or other patient management decisions.  A negative result may occur with improper specimen collection / handling, submission of specimen other than nasopharyngeal swab, presence of viral mutation(s) within the areas targeted by this assay, and inadequate number of viral copies (<250 copies / mL). A negative result must be combined with clinical observations, patient history, and epidemiological information. Fact Sheet for Patients:   StrictlyIdeas.no Fact Sheet for Healthcare Providers: BankingDealers.co.za This test is not yet approved or cleared  by the [place] FDA and has been authorized for detection and/or diagnosis of SARS-CoV-2 by FDA under an Emergency Use Authorization (EUA).  This EUA will remain in effect (meaning this test can be used) for the duration of the COVID-19 declaration under Section 564(b)(1) of the Act, 21 U.S.C. section 360bbb-3(b)(1), unless the authorization is terminated or revoked sooner. Performed at [facility], [place] [address], [place], [place] [address]   Culture, Urine     Status: Abnormal   Collection Time: 08/30/19  9:38 AM   Specimen: Urine, Catheterized  Result Value Ref Range Status   Specimen Description URINE, CATHETERIZED  Final   Special Requests   Final    NONE Performed at [facility], [place] [address] [place], [place] [address]    Culture >=100,000 COLONIES/mL ESCHERICHIA COLI (A)  Final   Report Status 09/01/2019 FINAL  Final   Organism ID, Bacteria ESCHERICHIA COLI (A)  Final      Susceptibility   Escherichia coli - MIC*    AMPICILLIN 4 SENSITIVE  Sensitive     CEFAZOLIN <=4 SENSITIVE Sensitive     CEFTRIAXONE <=1 SENSITIVE Sensitive     CIPROFLOXACIN <=0.25 SENSITIVE Sensitive     GENTAMICIN <=1 SENSITIVE Sensitive     IMIPENEM <=0.25 SENSITIVE Sensitive     NITROFURANTOIN <=16 SENSITIVE Sensitive     TRIMETH/SULFA <=20 SENSITIVE Sensitive     AMPICILLIN/SULBACTAM 4 SENSITIVE Sensitive  PIP/TAZO <=4 SENSITIVE Sensitive     * >=100,000 COLONIES/mL ESCHERICHIA COLI         Radiology Studies: No results found.      Scheduled Meds: . atorvastatin  10 mg Oral Daily  . bisoprolol  10 mg Oral Daily  . calcium-vitamin D  1 tablet Oral TID  . cefdinir  300 mg Oral Q2000  . enoxaparin (LOVENOX) injection  30 mg Subcutaneous Q24H  . insulin aspart  0-15 Units Subcutaneous TID AC & HS  . pantoprazole  40 mg Oral BID AC  . warfarin  5 mg Oral ONCE-1600  . Warfarin - Pharmacist Dosing Inpatient   Does not apply q1600   Continuous Infusions:    LOS: 2 days    Time spent: 35 minutes    Ramiro Harvest, MD [facility]   To contact the attending provider between 7A-7P or the covering provider during after hours 7P-7A, please log into the web site [email or web address] and access using universal [place] password for that web site. If you do not have the password, please call the hospital operator.  09/02/2019, 12:46 PM

## 2019-09-03 LAB — BASIC METABOLIC PANEL
Anion gap: 17 — ABNORMAL HIGH (ref 5–15)
BUN: 32 mg/dL — ABNORMAL HIGH (ref 8–23)
CO2: 22 mmol/L (ref 22–32)
Calcium: 9.5 mg/dL (ref 8.9–10.3)
Chloride: 92 mmol/L — ABNORMAL LOW (ref 98–111)
Creatinine, Ser: 1.36 mg/dL — ABNORMAL HIGH (ref 0.44–1.00)
GFR calc Af Amer: 39 mL/min — ABNORMAL LOW (ref >60)
GFR calc non Af Amer: 34 mL/min — ABNORMAL LOW (ref >60)
Glucose, Bld: 168 mg/dL — ABNORMAL HIGH (ref 70–99)
Potassium: 4.2 mmol/L (ref 3.5–5.1)
Sodium: 131 mmol/L — ABNORMAL LOW (ref 135–145)

## 2019-09-03 LAB — CBC
HCT: 27.4 % — ABNORMAL LOW (ref 36.0–46.0)
Hemoglobin: 9 g/dL — ABNORMAL LOW (ref 12.0–15.0)
MCH: 29.7 pg (ref 26.0–34.0)
MCHC: 32.8 g/dL (ref 30.0–36.0)
MCV: 90.4 fL (ref 80.0–100.0)
Platelets: 241 10*3/uL (ref 150–400)
RBC: 3.03 MIL/uL — ABNORMAL LOW (ref 3.87–5.11)
RDW: 13.5 % (ref 11.5–15.5)
WBC: 11.2 10*3/uL — ABNORMAL HIGH (ref 4.0–10.5)
nRBC: 0 % (ref 0.0–0.2)

## 2019-09-03 LAB — PROTIME-INR
INR: 2.3 — ABNORMAL HIGH (ref 0.8–1.2)
Prothrombin Time: 24.5 seconds — ABNORMAL HIGH (ref 11.4–15.2)

## 2019-09-03 LAB — GLUCOSE, CAPILLARY
Glucose-Capillary: 188 mg/dL — ABNORMAL HIGH (ref 70–99)
Glucose-Capillary: 200 mg/dL — ABNORMAL HIGH (ref 70–99)
Glucose-Capillary: 198 mg/dL — ABNORMAL HIGH (ref 70–99)
Glucose-Capillary: 270 mg/dL — ABNORMAL HIGH (ref 70–99)

## 2019-09-03 MED ORDER — WARFARIN SODIUM 5 MG PO TABS
5.0000 mg | ORAL_TABLET | Freq: Once | ORAL | Status: AC
  Administered 2019-09-03: 5 mg via ORAL
  Filled 2019-09-03: qty 1

## 2019-09-03 NOTE — TOC Progression Note (Addendum)
Transition of Care [facility]) - Progression Note    Patient Details  Name: Susan Potter MRN: [id number] Date of Birth: July 03, 1926  Transition of [facility]) CM/SW Contact  Patrice [place], [place] Phone Number: [phone] 09/03/2019, 9:45 AM  Clinical Narrative:    CSW reached out to [facility] and initiated authorization.Reference # [id number]. Clinicals faxed as well. CSW will inquire if a new COVID is needed.  3:50pm; authorization is back: Auth date: 09/04/19-09/06/19 Case Manager: Bjorn Pippin Reference #- [id number]  Patient will need a new COVID, MD alerted.   TOC team will continue to monitor for discharge planning needs.   Expected Discharge Plan: Skilled Nursing Facility Barriers to Discharge: Insurance Authorization  Expected Discharge Plan and Services Expected Discharge Plan: Skilled Nursing Facility   Discharge Planning Services: CM Consult Post Acute Care Choice: Durable Medical Equipment, Home Health Living arrangements for the past 2 months: Single Family Home                 DME Arranged: Hospital bed DME Agency: [employer] Date DME Agency Contacted: 08/31/19 Time DME Agency Contacted: 1238 Representative spoke with at DME Agency: Zack HH Arranged: RN, OT, PT HH Agency: [facility] Date [facility] Agency Contacted: 08/31/19 Time HH Agency Contacted: 1239 Representative spoke with at [facility] Agency: Cassie   Social Determinants of Health (SDOH) Interventions    Readmission Risk Interventions No flowsheet data found.

## 2019-09-03 NOTE — Plan of Care (Signed)

## 2019-09-03 NOTE — Progress Notes (Addendum)
ANTICOAGULATION CONSULT NOTE  Pharmacy Consult for Warfarin Indication: atrial fibrillation  No Known Allergies  Patient Measurements: Height: 5\' 7"  (170.2 cm) Weight: 99.8 kg (220 lb) IBW/kg (Calculated) : 61.6  Vital Signs: Temp: 97.9 F (36.6 C) (05/16 0434) Temp Source: Oral (05/15 2032) BP: 127/55 (05/16 0434) Pulse Rate: 69 (05/16 0434)  Labs: Recent Labs    09/01/19 0340 09/01/19 0340 09/02/19 0450 09/03/19 0655 09/03/19 0737  HGB 9.6*   < > 8.6* 9.0*  --   HCT 29.4*  --  26.2* 27.4*  --   PLT 249  --  251 241  --   LABPROT 17.8*  --  21.7*  --  24.5*  INR 1.5*  --  2.0*  --  2.3*  CREATININE 1.61*  --  1.50* 1.36*  --    < > = values in this interval not displayed.    Estimated Creatinine Clearance: 32.7 mL/min (A) (by C-G formula based on SCr of 1.36 mg/dL (H)).  Assessment: 86 year-old female with history of atrial fibrillation, on coumadin prior to admission that is managed by a coumadin clinic, presents from home after a fall 5/10 resulting in a nondisplaced right medial tibial plateau fracture that is treated nonoperatively.   Received Vitamin K 2 mg IV 5/12  INR on admission 2.9, INR today therapeutic at 2.3. Hgb low at 9.0, plt wnl.   Home warfarin regimen: 5 mg daily.  Goal of Therapy:  INR 2-3 Monitor platelets by anticoagulation protocol: Yes   Plan:  Warfarin 5 mg daily Daily INR Monitor for signs and symptoms of bleeding  7/12, PharmD PGY1 Pharmacy Resident  Please check AMION for all [facility] phone numbers After 10:00 PM, call Main Pharmacy [phone]  09/03/19      8:11 AM

## 2019-09-03 NOTE — Progress Notes (Signed)
PROGRESS NOTE    Susan Potter  DTO:[id number] DOB: 1926/07/30 DOA: 08/29/2019 PCP: [name], MD   Chief Complaint  Patient presents with  . Fall    Brief Narrative:  HPI per Dr. Leafy Half 86 year old female with past medical history of paroxysmal atrial fibrillation, diabetes mellitus type 2, vitamin D deficiency, osteoporosis, hypertension and hyperlipidemia who presents to [facility] emergency department status post fall with right lower extremity pain.  Patient explains that during the day on 5/10 the patient was walking through her backyard, working on the garden.  She bent over to pick up a flower plant and states that she tripped and fell.  She suddenly began to experience moderate to severe pain of the right knee and right lower extremity.  Patient describes his pain as aching quality, moderate to severe in intensity, without radiation and worse with weightbearing or movement of the affected extremity.  Upon further questioning patient denies chest pain, palpitations, lightheadedness or loss of consciousness surrounding this event.  The local fire department came to the patient's house and brought the patient into her home after she declined to be brought into the hospital.  Patient explains that throughout the day her symptoms continue to worsen with worsening right lower extremity pain.  Patient got to the point where she was completely unable to ambulate due to the severity of pain.  As at this point the patient presented to [facility] emergency department for evaluation.  Upon evaluation in the emergency department, imaging of the right lower extremity revealed a right tibial plateau fracture.  This was discussed with Dr. Susa Simmonds with [facility] who recommended keeping the patient n.p.o. after midnight and that him or one of his associates will see the patient in the morning for possible operative intervention.  The hospitalist group was then called  to assess patient for admission the hospital.  Assessment & Plan:   Principal Problem:   Tibial plateau fracture, right, closed, initial encounter Active Problems:   AF (paroxysmal atrial fibrillation) (HCC)   Type 2 diabetes mellitus without complication, without long-term current use of insulin (HCC)   Mixed diabetic hyperlipidemia associated with type 2 diabetes mellitus (HCC)   Essential hypertension   Fall at home, initial encounter   Fracture of right tibial plateau, closed, initial encounter   Hypomagnesemia   Acute lower UTI   Vitamin D deficiency  1 right tibial plateau fracture Secondary to mechanical fall.  Patient denies any syncopal episodes.  Patient seen by orthopedics recommended nonoperative management for this patient with hinged knee brace and nonweightbearing to the right lower extremity with outpatient follow-up.  PT/OT.  Patient has been assessed by PT and recommending SNF.  Patient initially refused SNF and wanted to go home with home health therapies.  Patient and daughter now have changed the mind and would like to go to SNF.  Social work consulted for SNF placement.   2.  Paroxysmal atrial fibrillation Patient with history of A. fib > 30 years.  Patient received some vitamin K in anticipation of possible surgery.  However patient no longer needs surgery and conservative treatment at this time per orthopedics.  INR at 2.3.  Continue Zebeta for rate control.  Coumadin for anticoagulation as patient now with conservative nonoperative management per orthopedics.   3.  E. coli UTI Urine cultures > 100,000 colonies of E. coli.  Pansensitive.  Patient was on IV Rocephin and subsequently transition to [facility] to complete a 5-day course of treatment.  4.  Hypomagnesemia Repleted.  Follow.  5.  Vitamin D deficiency Vitamin D 25 hydroxy at 23.61.  Os-Cal with vitamin D 1 tablet 3 times daily.  Magnesium at 2.8.  Outpatient follow-up.  6.  Diabetes mellitus type  2 Hemoglobin A1c 7.3.  CBG of 188 this morning.  Continue to hold oral hypoglycemic agents.  Sliding scale insulin.   7.  Hypertension Patient's indapamide was held.  Continue bisoprolol.  8.  Hyperlipidemia Statin.     DVT prophylaxis: Coumadin Code Status: Full Family Communication: Updated patient.  No family at bedside. Disposition:   Status is: Inpatient.    Dispo: The patient is from: Home              Anticipated d/c is to: Patient and family decided SNF is a better option.  Social work consulted for SNF placement.  To SNF when bed available.                Anticipated d/c date is: 09/04/2019              Patient currently on oral antibiotics.  INR therapeutic at 2.  Awaiting SNF placement.        Consultants:   Orthopedics: Dr. Susa Simmonds 08/30/2019  Procedures:   2D echo pending 08/30/2019  CT head/C-spine 08/29/2019  CT right knee 08/29/2019  Chest x-ray 08/29/2019  Plain films of the right knee 08/29/2019  Plain films of the pelvis 08/29/2019  Antimicrobials:   IV Rocephin 08/30/2019>>>>> 09/01/2019  Omnicef 09/01/2019   Subjective: Patient eating lunch.  Denies chest pain or shortness of breath.  No family at bedside.  Objective: Vitals:   09/02/19 0740 09/02/19 1447 09/02/19 2032 09/03/19 0434  BP: (!) 133/58 133/64 (!) 117/50 (!) 127/55  Pulse: 76 79 69 69  Resp: 17 17 16 18   Temp: 98.4 F (36.9 C) 98.3 F (36.8 C) 99 F (37.2 C) 97.9 F (36.6 C)  TempSrc: Oral Oral Oral   SpO2: 98% 99% 96% 99%  Weight:      Height:        Intake/Output Summary (Last 24 hours) at 09/03/2019 1218 Last data filed at 09/03/2019 0900 Gross per 24 hour  Intake 960 ml  Output 300 ml  Net 660 ml   Filed Weights   08/29/19 1815 08/30/19 1149  Weight: 99.8 kg 99.8 kg    Examination:  General exam: NAD Respiratory system: Lungs clear to auscultation bilaterally.  No wheezes, no crackles, no rhonchi.  Cardiovascular system: Irregularly  irregular Gastrointestinal system: Abdomen is soft, nontender, nondistended, positive bowel sounds.  No rebound.  No guarding. Central nervous system: Alert and oriented. No focal neurological deficits. Extremities: Right lower extremity with hinged knee brace.  Skin: No rashes, lesions or ulcers Psychiatry: Judgement and insight appear normal. Mood & affect appropriate.     Data Reviewed: I have personally reviewed following labs and imaging studies  CBC: Recent Labs  Lab 08/29/19 1817 08/29/19 1817 08/30/19 0310 08/31/19 0829 09/01/19 0340 09/02/19 0450 09/03/19 0655  WBC 14.3*   < > 11.9* 18.9* 16.4* 12.9* 11.2*  NEUTROABS 12.4*  --  9.8*  --   --  10.3*  --   HGB 11.7*   < > 10.9* 10.0* 9.6* 8.6* 9.0*  HCT 36.4   < > 33.4* 30.3* 29.4* 26.2* 27.4*  MCV 92.2   < > 91.3 91.3 90.7 91.3 90.4  PLT 251   < > 239 223 249 251 241   < > =  values in this interval not displayed.    Basic Metabolic Panel: Recent Labs  Lab 08/29/19 1817 08/30/19 0310 08/31/19 0829 09/01/19 0340 09/02/19 0450 09/03/19 0655  NA 133*  --  134* 133* 133* 131*  K 4.8  --  3.6 4.1 4.2 4.2  CL 99  --  95* 96* 97* 92*  CO2 23  --  25 27 28 22   GLUCOSE 216*  --  240* 183* 194* 168*  BUN 17  --  19 29* 35* 32*  CREATININE 1.41*  --  1.33* 1.61* 1.50* 1.36*  CALCIUM 8.7*  --  9.1 9.8 9.6 9.5  MG  --  1.6* 1.7 2.8*  --   --     GFR: Estimated Creatinine Clearance: 32.7 mL/min (A) (by C-G formula based on SCr of 1.36 mg/dL (H)).  Liver Function Tests: No results for input(s): AST, ALT, ALKPHOS, BILITOT, PROT, ALBUMIN in the last 168 hours.  CBG: Recent Labs  Lab 09/02/19 1205 09/02/19 1639 09/02/19 2030 09/03/19 0743 09/03/19 1131  GLUCAP 209* 142* 168* 188* 200*     Recent Results (from the past 240 hour(s))  SARS Coronavirus 2 by RT PCR (hospital order, performed in [facility] hospital lab) Nasopharyngeal Nasopharyngeal Swab     Status: None   Collection Time: 08/29/19  8:16 PM    Specimen: Nasopharyngeal Swab  Result Value Ref Range Status   SARS Coronavirus 2 NEGATIVE NEGATIVE Final    Comment: (NOTE) SARS-CoV-2 target nucleic acids are NOT DETECTED. The SARS-CoV-2 RNA is generally detectable in upper and lower respiratory specimens during the acute phase of infection. The lowest concentration of SARS-CoV-2 viral copies this assay can detect is 250 copies / mL. A negative result does not preclude SARS-CoV-2 infection and should not be used as the sole basis for treatment or other patient management decisions.  A negative result may occur with improper specimen collection / handling, submission of specimen other than nasopharyngeal swab, presence of viral mutation(s) within the areas targeted by this assay, and inadequate number of viral copies (<250 copies / mL). A negative result must be combined with clinical observations, patient history, and epidemiological information. Fact Sheet for Patients:   10/29/19 Fact Sheet for Healthcare Providers: BoilerBrush.com.cy This test is not yet approved or cleared  by the https://pope.com/ FDA and has been authorized for detection and/or diagnosis of SARS-CoV-2 by FDA under an Emergency Use Authorization (EUA).  This EUA will remain in effect (meaning this test can be used) for the duration of the COVID-19 declaration under Section 564(b)(1) of the Act, 21 U.S.C. section 360bbb-3(b)(1), unless the authorization is terminated or revoked sooner. Performed at [facility], [address] [place], [place]   Culture, Urine     Status: Abnormal   Collection Time: 08/30/19  9:38 AM   Specimen: Urine, Catheterized  Result Value Ref Range Status   Specimen Description URINE, CATHETERIZED  Final   Special Requests   Final    NONE Performed at [facility], [address], [place], [place]    Culture >=100,000 COLONIES/mL ESCHERICHIA COLI  (A)  Final   Report Status 09/01/2019 FINAL  Final   Organism ID, Bacteria ESCHERICHIA COLI (A)  Final      Susceptibility   Escherichia coli - MIC*    AMPICILLIN 4 SENSITIVE Sensitive     CEFAZOLIN <=4 SENSITIVE Sensitive     CEFTRIAXONE <=1 SENSITIVE Sensitive     CIPROFLOXACIN <=0.25 SENSITIVE  Sensitive     GENTAMICIN <=1 SENSITIVE Sensitive     IMIPENEM <=0.25 SENSITIVE Sensitive     NITROFURANTOIN <=16 SENSITIVE Sensitive     TRIMETH/SULFA <=20 SENSITIVE Sensitive     AMPICILLIN/SULBACTAM 4 SENSITIVE Sensitive     PIP/TAZO <=4 SENSITIVE Sensitive     * >=100,000 COLONIES/mL ESCHERICHIA COLI         Radiology Studies: No results found.      Scheduled Meds: . atorvastatin  10 mg Oral Daily  . bisoprolol  10 mg Oral Daily  . calcium-vitamin D  1 tablet Oral TID  . cefdinir  300 mg Oral Q2000  . insulin aspart  0-15 Units Subcutaneous TID AC & HS  . pantoprazole  40 mg Oral BID AC  . warfarin  5 mg Oral ONCE-1600  . Warfarin - Pharmacist Dosing Inpatient   Does not apply q1600   Continuous Infusions:    LOS: 3 days    Time spent: 35 minutes    Irine Seal, MD [facility]   To contact the attending provider between 7A-7P or the covering provider during after hours 7P-7A, please log into the web site [email or web address] and access using universal [place] password for that web site. If you do not have the password, please call the hospital operator.  09/03/2019, 12:18 PM

## 2019-09-04 DIAGNOSIS — B9620 Unspecified Escherichia coli [E. coli] as the cause of diseases classified elsewhere: Secondary | ICD-10-CM

## 2019-09-04 DIAGNOSIS — N390 Urinary tract infection, site not specified: Secondary | ICD-10-CM

## 2019-09-04 LAB — BASIC METABOLIC PANEL
Anion gap: 11 (ref 5–15)
BUN: 35 mg/dL — ABNORMAL HIGH (ref 8–23)
CO2: 27 mmol/L (ref 22–32)
Calcium: 9.2 mg/dL (ref 8.9–10.3)
Chloride: 94 mmol/L — ABNORMAL LOW (ref 98–111)
Creatinine, Ser: 1.49 mg/dL — ABNORMAL HIGH (ref 0.44–1.00)
GFR calc Af Amer: 35 mL/min — ABNORMAL LOW (ref >60)
GFR calc non Af Amer: 30 mL/min — ABNORMAL LOW (ref >60)
Glucose, Bld: 215 mg/dL — ABNORMAL HIGH (ref 70–99)
Potassium: 4.2 mmol/L (ref 3.5–5.1)
Sodium: 132 mmol/L — ABNORMAL LOW (ref 135–145)

## 2019-09-04 LAB — GLUCOSE, CAPILLARY
Glucose-Capillary: 187 mg/dL — ABNORMAL HIGH (ref 70–99)
Glucose-Capillary: 188 mg/dL — ABNORMAL HIGH (ref 70–99)

## 2019-09-04 LAB — PROTIME-INR
INR: 2.7 — ABNORMAL HIGH (ref 0.8–1.2)
Prothrombin Time: 27.6 seconds — ABNORMAL HIGH (ref 11.4–15.2)

## 2019-09-04 LAB — SARS CORONAVIRUS 2 (TAT 6-24 HRS): SARS Coronavirus 2: NEGATIVE

## 2019-09-04 MED ORDER — TRAMADOL HCL 50 MG PO TABS: 50.0000 mg | ORAL_TABLET | Freq: Four times a day (QID) | ORAL | 0 refills | Status: DC | PRN

## 2019-09-04 MED ORDER — WARFARIN SODIUM 5 MG PO TABS: 5.0000 mg | ORAL_TABLET | Freq: Once | ORAL | Status: DC

## 2019-09-04 MED ORDER — BISOPROLOL FUMARATE 10 MG PO TABS: 10.0000 mg | ORAL_TABLET | Freq: Every day | ORAL | 1 refills | Status: DC

## 2019-09-04 MED ORDER — PANTOPRAZOLE SODIUM 40 MG PO TBEC: 40.0000 mg | DELAYED_RELEASE_TABLET | Freq: Every day | ORAL | 0 refills | Status: AC

## 2019-09-04 NOTE — Plan of Care (Signed)

## 2019-09-04 NOTE — Progress Notes (Signed)
ANTICOAGULATION CONSULT NOTE  Pharmacy Consult for Warfarin Indication: atrial fibrillation  No Known Allergies  Patient Measurements: Height: 5\' 7"  (170.2 cm) Weight: 98.1 kg (216 lb 4.3 oz) IBW/kg (Calculated) : 61.6  Vital Signs: Temp: 98.8 F (37.1 C) (05/17 0806) Temp Source: Oral (05/17 0806) BP: 107/57 (05/17 0806) Pulse Rate: 91 (05/17 0806)  Labs: Recent Labs    09/02/19 0450 09/03/19 0655 09/03/19 0737 09/04/19 0724  HGB 8.6* 9.0*  --   --   HCT 26.2* 27.4*  --   --   PLT 251 241  --   --   LABPROT 21.7*  --  24.5* 27.6*  INR 2.0*  --  2.3* 2.7*  CREATININE 1.50* 1.36*  --   --     Estimated Creatinine Clearance: 32.4 mL/min (A) (by C-G formula based on SCr of 1.36 mg/dL (H)).  Assessment: 86 year-old female with history of atrial fibrillation, on coumadin prior to admission that is managed by a coumadin clinic, presents from home after a fall 5/10 resulting in a nondisplaced right medial tibial plateau fracture that is treated nonoperatively. Received Vitamin K 2 mg SQ 5/12. INR remains therapeutic today at 2.7. No bleeding noted.   Home warfarin regimen: 5 mg daily.  Goal of Therapy:  INR 2-3 Monitor platelets by anticoagulation protocol: Yes   Plan:  Warfarin 5mg  PO x 1 tonight Daily INR  7/12, PharmD, BCPS Clinical Pharmacist Please see AMION for all pharmacy numbers 09/04/2019 8:27 AM
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
## 2019-09-04 NOTE — Discharge Instructions (Signed)

## 2019-09-04 NOTE — TOC Transition Note (Signed)
Transition of Care [facility]) - CM/SW Discharge Note   Patient Details  Name: Susan Potter MRN: [id number] Date of Birth: 1926-09-25  Transition of Care [facility]) CM/SW Contact:  Epifanio Lesches, RN Phone Number: 09/04/2019, 11:04 AM   Clinical Narrative:    Patient will DC to: [facility] Anticipated DC date: 09/04/2019 Family notified: daughter, [name] Transport by: Sharin Mons   Per MD patient ready for DC today . RN, patient, patient's family, and facility notified of DC. Discharge Summary and FL2 sent to facility. RN to call report prior to discharge [phone]). DC packet on chart. Ambulance transport requested for patient.   RNCM will sign off for now as intervention is no longer needed. Please consult [place] again if new needs arise.     Final next level of care: Skilled Nursing Facility Barriers to Discharge: No Barriers Identified   Patient Goals and CMS Choice     Choice offered to / list presented to : Patient  Discharge Placement                       Discharge Plan and Services   Discharge Planning Services: CM Consult Post Acute Care Choice: Durable Medical Equipment, Home Health          DME Arranged: Hospital bed DME Agency: [employer] Date DME Agency Contacted: 08/31/19 Time DME Agency Contacted: 1238 Representative spoke with at DME Agency: Zack HH Arranged: RN, OT, PT HH Agency: [facility] Date [facility] Agency Contacted: 08/31/19 Time HH Agency Contacted: 1239 Representative spoke with at [facility] Agency: Cassie  Social Determinants of Health (SDOH) Interventions     Readmission Risk Interventions No flowsheet data found.

## 2019-09-05 ENCOUNTER — Encounter: Payer: Self-pay | Admitting: Endocrinology

## 2019-09-12 ENCOUNTER — Ambulatory Visit: Payer: Medicare Other

## 2019-09-18 ENCOUNTER — Other Ambulatory Visit: Payer: Self-pay | Admitting: Endocrinology

## 2019-09-18 ENCOUNTER — Other Ambulatory Visit: Payer: Self-pay

## 2019-09-18 ENCOUNTER — Inpatient Hospital Stay (HOSPITAL_COMMUNITY)
Admission: EM | Admit: 2019-09-18 | Discharge: 2019-09-21 | DRG: 689 | Disposition: A | Discharge status: Skilled Nursing Facility | Payer: Medicare Other | Attending: Internal Medicine | Admitting: Internal Medicine

## 2019-09-18 ENCOUNTER — Emergency Department (HOSPITAL_COMMUNITY): Payer: Medicare Other

## 2019-09-18 ENCOUNTER — Encounter (HOSPITAL_COMMUNITY): Payer: Self-pay

## 2019-09-18 DIAGNOSIS — Z66 Do not resuscitate: Secondary | ICD-10-CM | POA: Diagnosis present

## 2019-09-18 DIAGNOSIS — R400 Somnolence: Secondary | ICD-10-CM

## 2019-09-18 DIAGNOSIS — Z79899 Other long term (current) drug therapy: Secondary | ICD-10-CM

## 2019-09-18 DIAGNOSIS — I4811 Longstanding persistent atrial fibrillation: Secondary | ICD-10-CM

## 2019-09-18 DIAGNOSIS — Z1624 Resistance to multiple antibiotics: Secondary | ICD-10-CM | POA: Diagnosis present

## 2019-09-18 DIAGNOSIS — I480 Paroxysmal atrial fibrillation: Secondary | ICD-10-CM | POA: Diagnosis present

## 2019-09-18 DIAGNOSIS — I959 Hypotension, unspecified: Secondary | ICD-10-CM | POA: Diagnosis present

## 2019-09-18 DIAGNOSIS — E1169 Type 2 diabetes mellitus with other specified complication: Secondary | ICD-10-CM | POA: Diagnosis present

## 2019-09-18 DIAGNOSIS — E782 Mixed hyperlipidemia: Secondary | ICD-10-CM | POA: Diagnosis not present

## 2019-09-18 DIAGNOSIS — W19XXXA Unspecified fall, initial encounter: Secondary | ICD-10-CM | POA: Diagnosis present

## 2019-09-18 DIAGNOSIS — S82141A Displaced bicondylar fracture of right tibia, initial encounter for closed fracture: Secondary | ICD-10-CM | POA: Diagnosis present

## 2019-09-18 DIAGNOSIS — Z8249 Family history of ischemic heart disease and other diseases of the circulatory system: Secondary | ICD-10-CM | POA: Diagnosis not present

## 2019-09-18 DIAGNOSIS — G9341 Metabolic encephalopathy: Secondary | ICD-10-CM | POA: Diagnosis present

## 2019-09-18 DIAGNOSIS — E559 Vitamin D deficiency, unspecified: Secondary | ICD-10-CM | POA: Diagnosis present

## 2019-09-18 DIAGNOSIS — E119 Type 2 diabetes mellitus without complications: Secondary | ICD-10-CM | POA: Diagnosis not present

## 2019-09-18 DIAGNOSIS — Z7984 Long term (current) use of oral hypoglycemic drugs: Secondary | ICD-10-CM

## 2019-09-18 DIAGNOSIS — R4182 Altered mental status, unspecified: Secondary | ICD-10-CM | POA: Diagnosis present

## 2019-09-18 DIAGNOSIS — E785 Hyperlipidemia, unspecified: Secondary | ICD-10-CM | POA: Diagnosis present

## 2019-09-18 DIAGNOSIS — E876 Hypokalemia: Secondary | ICD-10-CM | POA: Diagnosis present

## 2019-09-18 DIAGNOSIS — B9620 Unspecified Escherichia coli [E. coli] as the cause of diseases classified elsewhere: Secondary | ICD-10-CM | POA: Diagnosis present

## 2019-09-18 DIAGNOSIS — B952 Enterococcus as the cause of diseases classified elsewhere: Secondary | ICD-10-CM | POA: Diagnosis present

## 2019-09-18 DIAGNOSIS — N390 Urinary tract infection, site not specified: Secondary | ICD-10-CM | POA: Diagnosis not present

## 2019-09-18 DIAGNOSIS — I4891 Unspecified atrial fibrillation: Secondary | ICD-10-CM | POA: Diagnosis present

## 2019-09-18 DIAGNOSIS — K219 Gastro-esophageal reflux disease without esophagitis: Secondary | ICD-10-CM | POA: Diagnosis present

## 2019-09-18 DIAGNOSIS — Z20822 Contact with and (suspected) exposure to covid-19: Secondary | ICD-10-CM | POA: Diagnosis present

## 2019-09-18 DIAGNOSIS — Z7901 Long term (current) use of anticoagulants: Secondary | ICD-10-CM | POA: Diagnosis not present

## 2019-09-18 DIAGNOSIS — I10 Essential (primary) hypertension: Secondary | ICD-10-CM | POA: Diagnosis present

## 2019-09-18 DIAGNOSIS — M810 Age-related osteoporosis without current pathological fracture: Secondary | ICD-10-CM | POA: Diagnosis present

## 2019-09-18 LAB — URINALYSIS, ROUTINE W REFLEX MICROSCOPIC
Bilirubin Urine: NEGATIVE
Glucose, UA: NEGATIVE mg/dL
Hgb urine dipstick: NEGATIVE
Ketones, ur: NEGATIVE mg/dL
Nitrite: NEGATIVE
Protein, ur: NEGATIVE mg/dL
Specific Gravity, Urine: 1.011 (ref 1.005–1.030)
pH: 5 (ref 5.0–8.0)

## 2019-09-18 LAB — CBC
HCT: 32 % — ABNORMAL LOW (ref 36.0–46.0)
Hemoglobin: 9.9 g/dL — ABNORMAL LOW (ref 12.0–15.0)
MCH: 29.4 pg (ref 26.0–34.0)
MCHC: 30.9 g/dL (ref 30.0–36.0)
MCV: 95 fL (ref 80.0–100.0)
Platelets: 428 10*3/uL — ABNORMAL HIGH (ref 150–400)
RBC: 3.37 MIL/uL — ABNORMAL LOW (ref 3.87–5.11)
RDW: 14.8 % (ref 11.5–15.5)
WBC: 10.2 10*3/uL (ref 4.0–10.5)
nRBC: 0 % (ref 0.0–0.2)

## 2019-09-18 LAB — COMPREHENSIVE METABOLIC PANEL
ALT: 10 U/L (ref 0–44)
AST: 18 U/L (ref 15–41)
Albumin: 2.6 g/dL — ABNORMAL LOW (ref 3.5–5.0)
Alkaline Phosphatase: 78 U/L (ref 38–126)
Anion gap: 10 (ref 5–15)
BUN: 16 mg/dL (ref 8–23)
CO2: 24 mmol/L (ref 22–32)
Calcium: 8.7 mg/dL — ABNORMAL LOW (ref 8.9–10.3)
Chloride: 105 mmol/L (ref 98–111)
Creatinine, Ser: 1.24 mg/dL — ABNORMAL HIGH (ref 0.44–1.00)
GFR calc Af Amer: 44 mL/min — ABNORMAL LOW (ref >60)
GFR calc non Af Amer: 38 mL/min — ABNORMAL LOW (ref >60)
Glucose, Bld: 123 mg/dL — ABNORMAL HIGH (ref 70–99)
Potassium: 3.9 mmol/L (ref 3.5–5.1)
Sodium: 139 mmol/L (ref 135–145)
Total Bilirubin: 0.9 mg/dL (ref 0.3–1.2)
Total Protein: 6.5 g/dL (ref 6.5–8.1)

## 2019-09-18 LAB — PROTIME-INR
INR: 1.9 — ABNORMAL HIGH (ref 0.8–1.2)
Prothrombin Time: 21.2 seconds — ABNORMAL HIGH (ref 11.4–15.2)

## 2019-09-18 LAB — GLUCOSE, CAPILLARY: Glucose-Capillary: 89 mg/dL (ref 70–99)

## 2019-09-18 LAB — CBG MONITORING, ED: Glucose-Capillary: 110 mg/dL — ABNORMAL HIGH (ref 70–99)

## 2019-09-18 LAB — SARS CORONAVIRUS 2 BY RT PCR (HOSPITAL ORDER, PERFORMED IN ~~LOC~~ HOSPITAL LAB): SARS Coronavirus 2: NEGATIVE

## 2019-09-18 MED ORDER — ACETAMINOPHEN 650 MG RE SUPP: 650.0000 mg | Freq: Four times a day (QID) | RECTAL | Status: DC | PRN

## 2019-09-18 MED ORDER — WARFARIN SODIUM 5 MG PO TABS
5.0000 mg | ORAL_TABLET | Freq: Once | ORAL | Status: AC
  Administered 2019-09-18: 5 mg via ORAL
  Filled 2019-09-18: qty 1

## 2019-09-18 MED ORDER — RALOXIFENE HCL 60 MG PO TABS
60.0000 mg | ORAL_TABLET | Freq: Every day | ORAL | Status: DC
  Administered 2019-09-19 – 2019-09-21 (×3): 60 mg via ORAL
  Filled 2019-09-18 (×3): qty 1

## 2019-09-18 MED ORDER — WARFARIN SODIUM 5 MG PO TABS: 5.0000 mg | ORAL_TABLET | Freq: Every day | ORAL | Status: DC

## 2019-09-18 MED ORDER — SODIUM CHLORIDE 0.9 % IV BOLUS
500.0000 mL | Freq: Once | INTRAVENOUS | Status: AC
  Administered 2019-09-18: 500 mL via INTRAVENOUS

## 2019-09-18 MED ORDER — ONDANSETRON HCL 4 MG/2ML IJ SOLN: 4.0000 mg | Freq: Four times a day (QID) | INTRAMUSCULAR | Status: DC | PRN

## 2019-09-18 MED ORDER — INSULIN ASPART 100 UNIT/ML ~~LOC~~ SOLN
0.0000 [IU] | Freq: Three times a day (TID) | SUBCUTANEOUS | Status: DC
  Administered 2019-09-19 – 2019-09-20 (×3): 2 [IU] via SUBCUTANEOUS

## 2019-09-18 MED ORDER — POLYETHYLENE GLYCOL 3350 17 G PO PACK: 17.0000 g | PACK | Freq: Every day | ORAL | Status: DC | PRN

## 2019-09-18 MED ORDER — ATORVASTATIN CALCIUM 10 MG PO TABS
10.0000 mg | ORAL_TABLET | Freq: Every day | ORAL | Status: DC
  Administered 2019-09-18 – 2019-09-20 (×3): 10 mg via ORAL
  Filled 2019-09-18 (×3): qty 1

## 2019-09-18 MED ORDER — ACETAMINOPHEN 325 MG PO TABS
650.0000 mg | ORAL_TABLET | Freq: Four times a day (QID) | ORAL | Status: DC | PRN
  Administered 2019-09-18: 650 mg via ORAL
  Filled 2019-09-18: qty 2

## 2019-09-18 MED ORDER — BISOPROLOL-HYDROCHLOROTHIAZIDE 10-6.25 MG PO TABS
1.0000 | ORAL_TABLET | Freq: Every day | ORAL | Status: DC
  Filled 2019-09-18 (×2): qty 1

## 2019-09-18 MED ORDER — SODIUM CHLORIDE 0.9% FLUSH
3.0000 mL | Freq: Once | INTRAVENOUS | Status: AC
  Administered 2019-09-18: 3 mL via INTRAVENOUS

## 2019-09-18 MED ORDER — WARFARIN - PHYSICIAN DOSING INPATIENT: Freq: Every day | Status: DC

## 2019-09-18 MED ORDER — PANTOPRAZOLE SODIUM 40 MG PO TBEC
40.0000 mg | DELAYED_RELEASE_TABLET | Freq: Every day | ORAL | Status: DC
  Administered 2019-09-19 – 2019-09-21 (×3): 40 mg via ORAL
  Filled 2019-09-18 (×3): qty 1

## 2019-09-18 MED ORDER — INSULIN GLARGINE 100 UNIT/ML ~~LOC~~ SOLN
10.0000 [IU] | Freq: Every day | SUBCUTANEOUS | Status: DC
  Administered 2019-09-18 – 2019-09-20 (×3): 10 [IU] via SUBCUTANEOUS
  Filled 2019-09-18 (×4): qty 0.1

## 2019-09-18 MED ORDER — ONDANSETRON HCL 4 MG PO TABS: 4.0000 mg | ORAL_TABLET | Freq: Four times a day (QID) | ORAL | Status: DC | PRN

## 2019-09-18 MED ORDER — WARFARIN SODIUM 2.5 MG PO TABS
2.5000 mg | ORAL_TABLET | Freq: Every day | ORAL | Status: DC
  Administered 2019-09-19 – 2019-09-20 (×2): 2.5 mg via ORAL
  Filled 2019-09-18 (×3): qty 1

## 2019-09-18 NOTE — ED Notes (Signed)
Pt changed, had BM, and new brief placed. Rectal temp taken and in and out performed

## 2019-09-18 NOTE — Progress Notes (Signed)
Received page from pharmacy about Warfarin dosing. After further review, Warfarin was found to have been decreased from 5 mg to 2.5 mg qhs however patient does not appear to have been completely compliant by review of med refills. Will give the original 5 mg tonight as patient slightly sub-therapeutic and then start her home dosing of 2.5 mg thereafter. Cont to follow INR daily.  Jerilynn Birkenhead, MD [facility]

## 2019-09-18 NOTE — H&P (Signed)
[facility] History and Physical  Susan Potter MWU:[id number] DOB: 01-24-1927 DOA: 09/18/2019  Referring physician: Martinique Robinson, PA-C PCP: Elayne Snare, MD   Chief Complaint: altered mental status  HPI: Susan Potter is a 86 y.o. female with past medical history of paroxysmal atrial fibrillation, diabetes mellitus type 2, vitamin D deficiency, osteoporosis, hypertension and hyperlipidemia who presents to [facility] emergency department with altered mental status.   She was recently discharged after admission from 08/29/2019 - 09/04/2019 for R tibial plateau fracture that was managed non-surgically to rehab. Also had E.coli UTI treated w Ceftriaxone and Omnicef.   Reportedly she ate breakfast like normal this morning, and then when staff went to check on her and she was unresponsive.   On my discussion with ED provider and ED nurse she was currently still  mostlyunresponsive, responsive to tactile stimuli.  On my entry to the room patient awoke and was able to converse and able questions. She was oriented to person, place (hospital), 2001 for year, January for month. She was not sure where se was brought from. Denies pain anywhere.  Daughter Susan Potter [phone]) was at bedside. She reports she was called around 10:30 and told mother was unresponsive and going to [facility]. When she arrived to ED earlier she was unable to arouse her mother either. Prior to fracture reports her mother was living independently at home. Currently appears confused to daughter as well. Daughter reports she had a COVID vaccine series that she completed in 05/2019.   Review of MAR from rehab shows only getting tramadol but had not had it for several days.    In ED VS were normal, BMP unremarkable, CBC w hgb 9.9 (roughly her recent baseline, INR 1.9, COVID test negative, UA unremarkable, CT head and CXR w/o acute pathology, UCx obtained.  She was admitted for further workup of AMS.    Review of  Systems:  Pertinent positives and negative per HPI, all others reviewed and negative   Past Medical History:  Diagnosis Date  . AF (paroxysmal atrial fibrillation) ([place]) 08/29/2019  . Atrial fibrillation ([place])   . Diabetes mellitus without complication ([place])   . Hypertension   . Mixed diabetic hyperlipidemia associated with type 2 diabetes mellitus ([place]) 08/29/2019  . Osteoporosis   . Retinal vein occlusion   . Type 2 diabetes mellitus without complication, without long-term current use of insulin ([place]) 08/29/2019  . Vitamin D deficiency    History reviewed. No pertinent surgical history. Social History:  reports that she has never smoked. She has never used smokeless tobacco. She reports that she does not drink alcohol or use drugs.  No Known Allergies  Family History  Problem Relation Age of Onset  . Healthy Mother   . Early death Father   . Heart disease Brother      Prior to Admission medications   Medication Sig Start Date End Date Taking? Authorizing Provider  acetaminophen (TYLENOL) 650 MG CR tablet Take 1,300 mg by mouth every 8 (eight) hours as needed for pain.    [provider]  atorvastatin (LIPITOR) 10 MG tablet Take 1 tablet (10 mg total) by mouth daily. 03/14/19   Elayne Snare, MD  bisoprolol-hydrochlorothiazide [facility]) 10-6.25 MG tablet Take 1 tablet by mouth daily. 03/14/19   Elayne Snare, MD  Blood Glucose Monitoring Suppl (ONE TOUCH ULTRA SYSTEM KIT) W/DEVICE KIT One touch Ultra 2 Glucometer 11/11/12   Elayne Snare, MD  ergocalciferol (VITAMIN D2) 1.25 MG (50000 UT) capsule Take 1 capsule (  50,000 Units total) by mouth See admin instructions. Take 1 capsule by mouth once every 15 days. 06/29/19   Elayne Snare, MD  glimepiride (AMARYL) 2 MG tablet TAKE 1.5 TABLETS BY MOUTH EVERY DAY AT [facility] TIME 04/13/19   Elayne Snare, MD  glucose blood test strip One touch Ultra 2 test strips only 12/16/15   Elayne Snare, MD  indapamide (LOZOL) 1.25 MG tablet Take 1 tablet (1.25  mg total) by mouth daily. 06/21/19   Elayne Snare, MD  JANUMET 50-1000 MG tablet Take 1 tablet by mouth 2 (two) times daily. 08/11/19   [provider]  OVER THE COUNTER MEDICATION Place 1 drop into both eyes daily as needed (dry eyes/itching).    [provider]  pantoprazole (PROTONIX) 40 MG tablet Take 1 tablet (40 mg total) by mouth daily. 09/04/19   Eugenie Filler, MD  raloxifene (EVISTA) 60 MG tablet Take 1 tablet (60 mg total) by mouth daily. 06/29/19   Elayne Snare, MD  sitaGLIPtin-metformin (JANUMET) 50-1000 MG tablet Take 1 tablet by mouth 2 (two) times daily with a meal. 03/14/19   Elayne Snare, MD  traMADol (ULTRAM) 50 MG tablet Take 1 tablet (50 mg total) by mouth every 6 (six) hours as needed for moderate pain. 09/04/19   Eugenie Filler, MD  Vitamin D, Ergocalciferol, (DRISDOL) 1.25 MG (50000 UNIT) CAPS capsule Take 1 capsule by mouth every 14 (fourteen) days. 06/29/19   [provider]  warfarin (COUMADIN) 5 MG tablet Take 1 tablet daily or Take as directed by anticoagulation clinic 04/19/19   Elayne Snare, MD   Physical Exam: Vitals:   09/18/19 1245 09/18/19 1300 09/18/19 1406 09/18/19 1448  BP: (!) 105/46 99/74 122/63 (!) 104/53  Pulse: 66 63 65 63  Resp: '17 18 16 16  ' Temp:  99.4 F (37.4 C)    TempSrc:  Oral    SpO2: 99% 98% 98% 100%  Weight:      Height:        Wt Readings from Last 3 Encounters:  09/18/19 98 kg  09/03/19 98.1 kg  02/24/19 95.3 kg    General:  Appears calm and comfortable Eyes: PERRL, normal lids, irises & conjunctiva ENT: grossly normal hearing, lips & tongue Neck: no LAD, masses or thyromegaly Cardiovascular: RRR, 1/6 systolic murmur. No LE edema. Telemetry: SR, no arrhythmias  Respiratory: CTA bilaterally, no w/r/r. Normal respiratory effort. Abdomen: soft, ntnd Skin: no rash or induration seen on limited exam Musculoskeletal: grossly normal tone BUE/BLE. R knee swollen and in brace Psychiatric: grossly normal mood  and affect, speech fluent and appropriate Neurologic: grossly non-focal.          Labs on Admission:  Basic Metabolic Panel: Recent Labs  Lab 09/18/19 1221  NA 139  K 3.9  CL 105  CO2 24  GLUCOSE 123*  BUN 16  CREATININE 1.24*  CALCIUM 8.7*   Liver Function Tests: Recent Labs  Lab 09/18/19 1221  AST 18  ALT 10  ALKPHOS 78  BILITOT 0.9  PROT 6.5  ALBUMIN 2.6*   No results for input(s): LIPASE, AMYLASE in the last 168 hours. No results for input(s): AMMONIA in the last 168 hours. CBC: Recent Labs  Lab 09/18/19 1221  WBC 10.2  HGB 9.9*  HCT 32.0*  MCV 95.0  PLT 428*   Cardiac Enzymes: No results for input(s): CKTOTAL, CKMB, CKMBINDEX, TROPONINI in the last 168 hours.  BNP (last 3 results) No results for input(s): BNP in the last 8760  hours.  ProBNP (last 3 results) No results for input(s): PROBNP in the last 8760 hours.  CBG: Recent Labs  Lab 09/18/19 1223  GLUCAP 110*    Radiological Exams on Admission: CT Head Wo Contrast  Result Date: 09/18/2019 CLINICAL DATA:  Altered mental status EXAM: CT HEAD WITHOUT CONTRAST TECHNIQUE: Contiguous axial images were obtained from the base of the skull through the vertex without intravenous contrast. COMPARISON:  08/29/2019 FINDINGS: Brain: No evidence of acute infarction, hemorrhage, extra-axial collection, ventriculomegaly, or mass effect. Small left basal ganglia lacunar infarct. Generalized cerebral atrophy. Periventricular white matter low attenuation likely secondary to microangiopathy. Vascular: Cerebrovascular atherosclerotic calcifications are noted. Skull: Negative for fracture or focal lesion. Sinuses/Orbits: Visualized portions of the orbits are unremarkable. Visualized portions of the paranasal sinuses are unremarkable. Visualized portions of the mastoid air cells are unremarkable. Other: None. IMPRESSION: 1. No acute intracranial pathology. 2. Chronic microvascular disease and cerebral atrophy.  Electronically Signed   By: Kathreen Devoid   On: 09/18/2019 13:57   DG Chest Port 1 View  Result Date: 09/18/2019 CLINICAL DATA:  Altered mental status EXAM: PORTABLE CHEST 1 VIEW COMPARISON:  Aug 29, 2019. FINDINGS: Lungs are clear. Heart is upper normal in size with pulmonary vascularity normal. There is aortic atherosclerosis. No adenopathy. Bones are osteoporotic. IMPRESSION: Lungs clear. Heart upper normal in size. Aortic Atherosclerosis (ICD10-I70.0). Electronically Signed   By: Lowella Grip III M.D.   On: 09/18/2019 13:10    EKG: Independently reviewed. ECG done today w Afib, otherwise unremarkable w/o ischemic changes  Assessment/Plan Active Problems:   Atrial fibrillation (HCC)   Long term (current) use of anticoagulants   Age-related osteoporosis without current pathological fracture   Type 2 diabetes mellitus without complication, without long-term current use of insulin (HCC)   Mixed diabetic hyperlipidemia associated with type 2 diabetes mellitus ([place])   Essential hypertension  Susan Potter is a 86 y.o. female with past medical history of paroxysmal atrial fibrillation, diabetes mellitus type 2, vitamin D deficiency, osteoporosis, hypertension and hyperlipidemia who presents to [facility] emergency department with altered mental status.   #AMS Patient initially presented mostly unresponsive, mental status now clearing significantly and able to hold a conversation. CT head unremarkable, CMP and CBC also unremarkable. UA not suggestive of infection but UCx sent. No localizing symptoms on my history. Infection/sepsis is a posibility, low suspicion for involvement of recent fracture. Per rehab [facility] has not been taking narcotic medications so this seems unlikely. No focal neurological symptoms to suggest stroke. Low suspicion for thyroid or vitamin deficiencies given acute nature of change. Given at baseline patient is aox3 and independent will monitor overnight. - f/u  UCx - repeat labs in AM  #Known Medical Problems HTN: cont home [place] Afib: cont coumadin 21m daily, INR just below goal at 1.9 on admission HLD: cont home atorvastatin DM2: hold oral glycemic medications (glimeperide, janumet). Cont lantus 10u QHS, LISS TID GERD: cont home PPI Osteoporosis: cont home raloxifene  Code Status: DNR/DNI, confirmed DVT [facility]: daughter DJettie [phone]  updated at bedside Disposition Plan: Admit to inpatient  Time spent: 50 min  MClarnce FlockMD/MPH [facility]

## 2019-09-18 NOTE — ED Provider Notes (Signed)
[facility] EMERGENCY DEPARTMENT Provider Note   CSN: [id number] Arrival date & time: 09/18/19  1156     History Chief Complaint  Patient presents with  . Altered Mental Status    Susan Potter is a 86 y.o. female w PMHx a fib on coumadin, DM, HTN, presenting to the emergency department from collapse nursing home for altered mental status.  It is reported that patient ate her breakfast like normal this morning, however when they went back to check in on her she was found unresponsive.  Patient's daughter is at bedside who provides some history.  She states she had a tibial fracture about 2 weeks ago and was admitted to the hospital then discharged back to nursing facility.  She had a closed fracture of the tibial plateau per chart review.  She was also noted to have UTI during that admission which grew out E. Coli.  Her daughter states she has not had any recent falls at the nursing home.  She has been taking Coumadin.  She states somewhat recently there was elevated and they would her dose for a couple of days.  Level 5 caveat secondary to altered mental status.  The history is limited by the condition of the patient.       Past Medical History:  Diagnosis Date  . AF (paroxysmal atrial fibrillation) ([place]) 08/29/2019  . Atrial fibrillation ([place])   . Diabetes mellitus without complication ([place])   . Hypertension   . Mixed diabetic hyperlipidemia associated with type 2 diabetes mellitus ([place]) 08/29/2019  . Osteoporosis   . Retinal vein occlusion   . Type 2 diabetes mellitus without complication, without long-term current use of insulin ([place]) 08/29/2019  . Vitamin D deficiency     Patient Active Problem List   Diagnosis Date Noted  . E. coli UTI   . Hypomagnesemia 08/30/2019  . Acute lower UTI 08/30/2019  . Vitamin D deficiency 08/30/2019  . AF (paroxysmal atrial fibrillation) ([place]) 08/29/2019  . Type 2 diabetes mellitus without complication, without long-term  current use of insulin ([place]) 08/29/2019  . Mixed diabetic hyperlipidemia associated with type 2 diabetes mellitus ([place]) 08/29/2019  . Essential hypertension 08/29/2019  . Fall at home, initial encounter 08/29/2019  . Tibial plateau fracture, right, closed, initial encounter 08/29/2019  . Fracture of right tibial plateau, closed, initial encounter 08/29/2019  . Age-related osteoporosis without current pathological fracture 10/15/2017  . Encounter for therapeutic drug monitoring 05/12/2013  . Long term (current) use of anticoagulants 02/08/2013  . Type II or unspecified type diabetes mellitus without mention of complication, uncontrolled 11/08/2012  . Unspecified essential hypertension 11/08/2012  . Pure hypercholesterolemia 11/08/2012  . Atrial fibrillation ([place]) 11/08/2012  . Osteoporosis, unspecified 11/08/2012    History reviewed. No pertinent surgical history.   OB History   No obstetric history on file.     Family History  Problem Relation Age of Onset  . Healthy Mother   . Early death Father   . Heart disease Brother     Social History   Tobacco Use  . Smoking status: Never Smoker  . Smokeless tobacco: Never Used  Substance Use Topics  . Alcohol use: Never  . Drug use: Never    Home Medications Prior to Admission medications   Medication Sig Start Date End Date Taking? Authorizing Provider  acetaminophen (TYLENOL) 650 MG CR tablet Take 650 mg by mouth every 6 (six) hours as needed for pain.    Yes [provider]  atorvastatin (  LIPITOR) 10 MG tablet Take 1 tablet (10 mg total) by mouth daily. Patient taking differently: Take 10 mg by mouth daily. At 2100 03/14/19  Yes Elayne Snare, MD  bisoprolol-hydrochlorothiazide [facility]) 10-6.25 MG tablet Take 1 tablet by mouth daily. Patient taking differently: Take 1 tablet by mouth daily. At 0900 03/14/19  Yes Elayne Snare, MD  ergocalciferol (VITAMIN D2) 1.25 MG (50000 UT) capsule Take 1 capsule (50,000 Units total)  by mouth See admin instructions. Take 1 capsule by mouth once every 15 days. Patient taking differently: Take 50,000 Units by mouth every 14 (fourteen) days.  06/29/19  Yes Elayne Snare, MD  glimepiride (AMARYL) 2 MG tablet TAKE 1.5 TABLETS BY MOUTH EVERY DAY AT [facility] TIME Patient taking differently: Take 3 mg by mouth every evening. At 1700 04/13/19  Yes Elayne Snare, MD  Infant Care Products [facility]) CREA Apply 1 application topically in the morning, at noon, and at bedtime. Apply to stomach folds every shift for irritation   Yes [provider]  insulin glargine (LANTUS) 100 unit/mL SOPN Inject 10 Units into the skin at bedtime.   Yes [provider]  magnesium oxide (MAG-OX) 400 MG tablet Take 400 mg by mouth daily. At 0900   Yes [provider]  Nutritional Supplements (FEEDING SUPPLEMENT, BOOST BREEZE,) LIQD Take 1 Container by mouth 2 (two) times daily. At 0900 and 1800   Yes [provider]  nystatin cream (MYCOSTATIN) Apply 1 application topically 2 (two) times daily. Apply to beneath breasts x14 days 09/05/19 09/20/19 Yes [provider]  pantoprazole (PROTONIX) 40 MG tablet Take 1 tablet (40 mg total) by mouth daily. Patient taking differently: Take 40 mg by mouth daily. At 0630 09/04/19  Yes Eugenie Filler, MD  raloxifene (EVISTA) 60 MG tablet Take 1 tablet (60 mg total) by mouth daily. Patient taking differently: Take 60 mg by mouth daily. At 0900 06/29/19  Yes Elayne Snare, MD  sitaGLIPtin-metformin (JANUMET) 50-1000 MG tablet Take 1 tablet by mouth 2 (two) times daily with a meal. Patient taking differently: Take 1 tablet by mouth in the morning and at bedtime. At 0900 and 2100 03/14/19  Yes Elayne Snare, MD  warfarin (COUMADIN) 2.5 MG tablet Take 2.5 mg by mouth at bedtime.   Yes [provider]  Blood Glucose Monitoring Suppl (ONE TOUCH ULTRA SYSTEM KIT) W/DEVICE KIT One touch Ultra 2 Glucometer 11/11/12   Elayne Snare, MD  glucose  blood test strip One touch Ultra 2 test strips only 12/16/15   Elayne Snare, MD  indapamide (LOZOL) 1.25 MG tablet Take 1 tablet (1.25 mg total) by mouth daily. Patient not taking: Reported on 09/18/2019 06/21/19   Elayne Snare, MD  traMADol (ULTRAM) 50 MG tablet Take 1 tablet (50 mg total) by mouth every 6 (six) hours as needed for moderate pain. Patient not taking: Reported on 09/18/2019 09/04/19   Eugenie Filler, MD  warfarin (COUMADIN) 5 MG tablet Take 1 tablet daily or Take as directed by anticoagulation clinic 04/19/19   Elayne Snare, MD    Allergies    Patient has no known allergies.  Review of Systems   Review of Systems  Unable to perform ROS: Patient unresponsive    Physical Exam Updated Vital Signs BP (!) 105/56   Pulse 65   Temp 99.4 F (37.4 C) (Oral)   Resp 15   Ht '5\' 7"'  (1.702 m)   Wt 98 kg   SpO2 99%   BMI 33.84 kg/m   Physical  Exam Vitals and nursing note reviewed.  Constitutional:      Appearance: She is well-developed.  HENT:     Head: Normocephalic and atraumatic.  Eyes:     Conjunctiva/sclera: Conjunctivae normal.  Cardiovascular:     Rate and Rhythm: Normal rate. Rhythm irregular.  Pulmonary:     Effort: Pulmonary effort is normal. No respiratory distress.     Breath sounds: Normal breath sounds.  Abdominal:     General: Bowel sounds are normal.     Palpations: Abdomen is soft.     Comments: No obvious tenderness to palpation of the abdomen  Musculoskeletal:     Comments: Right lower extremity with bruising and swelling noted about the knee and proximal tibia.  There is a knee brace in place.  No erythema.  Skin:    General: Skin is warm.  Neurological:     Comments: Patient is somnolent, will physically respond to light touch to her extremities, however will not arouse.  Pupils are pinpoint bilaterally and responsive.  No obvious facial droop.  Psychiatric:        Behavior: Behavior normal.     ED Results / Procedures / Treatments   Labs  (all labs ordered are listed, but only abnormal results are displayed) Labs Reviewed  COMPREHENSIVE METABOLIC PANEL - Abnormal; Notable for the following components:      Result Value   Glucose, Bld 123 (*)    Creatinine, Ser 1.24 (*)    Calcium 8.7 (*)    Albumin 2.6 (*)    GFR calc non Af Amer 38 (*)    GFR calc Af Amer 44 (*)    All other components within normal limits  CBC - Abnormal; Notable for the following components:   RBC 3.37 (*)    Hemoglobin 9.9 (*)    HCT 32.0 (*)    Platelets 428 (*)    All other components within normal limits  PROTIME-INR - Abnormal; Notable for the following components:   Prothrombin Time 21.2 (*)    INR 1.9 (*)    All other components within normal limits  CBG MONITORING, ED - Abnormal; Notable for the following components:   Glucose-Capillary 110 (*)    All other components within normal limits  URINE CULTURE  URINALYSIS, ROUTINE W REFLEX MICROSCOPIC    EKG None  Radiology CT Head Wo Contrast  Result Date: 09/18/2019 CLINICAL DATA:  Altered mental status EXAM: CT HEAD WITHOUT CONTRAST TECHNIQUE: Contiguous axial images were obtained from the base of the skull through the vertex without intravenous contrast. COMPARISON:  08/29/2019 FINDINGS: Brain: No evidence of acute infarction, hemorrhage, extra-axial collection, ventriculomegaly, or mass effect. Small left basal ganglia lacunar infarct. Generalized cerebral atrophy. Periventricular white matter low attenuation likely secondary to microangiopathy. Vascular: Cerebrovascular atherosclerotic calcifications are noted. Skull: Negative for fracture or focal lesion. Sinuses/Orbits: Visualized portions of the orbits are unremarkable. Visualized portions of the paranasal sinuses are unremarkable. Visualized portions of the mastoid air cells are unremarkable. Other: None. IMPRESSION: 1. No acute intracranial pathology. 2. Chronic microvascular disease and cerebral atrophy. Electronically Signed   By:  Kathreen Devoid   On: 09/18/2019 13:57   DG Chest Port 1 View  Result Date: 09/18/2019 CLINICAL DATA:  Altered mental status EXAM: PORTABLE CHEST 1 VIEW COMPARISON:  Aug 29, 2019. FINDINGS: Lungs are clear. Heart is upper normal in size with pulmonary vascularity normal. There is aortic atherosclerosis. No adenopathy. Bones are osteoporotic. IMPRESSION: Lungs clear. Heart upper normal in size. Aortic  Atherosclerosis (ICD10-I70.0). Electronically Signed   By: Lowella Grip III M.D.   On: 09/18/2019 13:10    Procedures Procedures (including critical care time)  Medications Ordered in ED Medications  sodium chloride flush (NS) 0.9 % injection 3 mL (3 mLs Intravenous Given 09/18/19 1406)  sodium chloride 0.9 % bolus 500 mL (0 mLs Intravenous Stopped 09/18/19 1448)  sodium chloride 0.9 % bolus 500 mL (500 mLs Intravenous New Bag/Given 09/18/19 1526)    ED Course  I have reviewed the triage vital signs and the nursing notes.  Pertinent labs & imaging results that were available during my care of the patient were reviewed by me and considered in my medical decision making (see chart for details).  Clinical Course as of Sep 18 1526  Mon Sep 18, 2019  1500 Nursing made aware that workup is pending urine.    [JR]  1526 [facility] accepting admission   [JR]    Clinical Course User Index [JR] Robinson, [place] N, PA-C   MDM Rules/Calculators/A&P                     Patient presenting from collapse nursing home with altered mental status that began this morning.  She reportedly had breakfast like normal, however became unresponsive.  She is wanting to light touch on exam, however is not arousable.  She is chronically in A. fib though with normal rate here in the ED.  CT imaging of the head ordered, chest x-ray, labs and UA.  Patient's blood pressures are low normal, however is not meeting SIRS criteria.  Labs without leukocytosis, hemoglobin is improved from 2 weeks ago.  INR is nearly therapeutic at  1.9.  Metabolic panel is unremarkable.  CBG is 110.  CT head is negative.  Chest x-ray is negative for infiltrate.  Rectal temp is 97.6.  Patient will require admission for further evaluation of altered mental status.  [facility] hospitalist accepting admission.  Final Clinical Impression(s) / ED Diagnoses  Final diagnoses:  Somnolence    Rx / DC Orders ED Discharge Orders    None       Robinson, [place] N, PA-C 09/18/19 1550    Pattricia Boss, MD 09/20/19 1149

## 2019-09-18 NOTE — ED Triage Notes (Signed)
Per [facility] pt from [facility], they called 911 for pt with a 3-5 second period of unresponsiveness. Pt has not been alert for [facility]  20g RAC

## 2019-09-19 DIAGNOSIS — M810 Age-related osteoporosis without current pathological fracture: Secondary | ICD-10-CM

## 2019-09-19 LAB — GLUCOSE, CAPILLARY
Glucose-Capillary: 73 mg/dL (ref 70–99)
Glucose-Capillary: 83 mg/dL (ref 70–99)
Glucose-Capillary: 103 mg/dL — ABNORMAL HIGH (ref 70–99)
Glucose-Capillary: 189 mg/dL — ABNORMAL HIGH (ref 70–99)
Glucose-Capillary: 147 mg/dL — ABNORMAL HIGH (ref 70–99)

## 2019-09-19 LAB — COMPREHENSIVE METABOLIC PANEL
ALT: 10 U/L (ref 0–44)
AST: 14 U/L — ABNORMAL LOW (ref 15–41)
Albumin: 2.5 g/dL — ABNORMAL LOW (ref 3.5–5.0)
Alkaline Phosphatase: 80 U/L (ref 38–126)
Anion gap: 9 (ref 5–15)
BUN: 14 mg/dL (ref 8–23)
CO2: 26 mmol/L (ref 22–32)
Calcium: 8.5 mg/dL — ABNORMAL LOW (ref 8.9–10.3)
Chloride: 105 mmol/L (ref 98–111)
Creatinine, Ser: 1 mg/dL (ref 0.44–1.00)
GFR calc Af Amer: 57 mL/min — ABNORMAL LOW (ref >60)
GFR calc non Af Amer: 49 mL/min — ABNORMAL LOW (ref >60)
Glucose, Bld: 65 mg/dL — ABNORMAL LOW (ref 70–99)
Potassium: 3.3 mmol/L — ABNORMAL LOW (ref 3.5–5.1)
Sodium: 140 mmol/L (ref 135–145)
Total Bilirubin: 1.2 mg/dL (ref 0.3–1.2)
Total Protein: 5.9 g/dL — ABNORMAL LOW (ref 6.5–8.1)

## 2019-09-19 LAB — CBC
HCT: 29.5 % — ABNORMAL LOW (ref 36.0–46.0)
Hemoglobin: 9.3 g/dL — ABNORMAL LOW (ref 12.0–15.0)
MCH: 29.6 pg (ref 26.0–34.0)
MCHC: 31.5 g/dL (ref 30.0–36.0)
MCV: 93.9 fL (ref 80.0–100.0)
Platelets: 407 10*3/uL — ABNORMAL HIGH (ref 150–400)
RBC: 3.14 MIL/uL — ABNORMAL LOW (ref 3.87–5.11)
RDW: 14.9 % (ref 11.5–15.5)
WBC: 8.6 10*3/uL (ref 4.0–10.5)
nRBC: 0 % (ref 0.0–0.2)

## 2019-09-19 LAB — PROTIME-INR
INR: 2.1 — ABNORMAL HIGH (ref 0.8–1.2)
Prothrombin Time: 22.5 seconds — ABNORMAL HIGH (ref 11.4–15.2)

## 2019-09-19 LAB — MRSA PCR SCREENING: MRSA by PCR: NEGATIVE

## 2019-09-19 LAB — APTT: aPTT: 33 seconds (ref 24–36)

## 2019-09-19 LAB — MAGNESIUM: Magnesium: 1.6 mg/dL — ABNORMAL LOW (ref 1.7–2.4)

## 2019-09-19 MED ORDER — POTASSIUM CHLORIDE CRYS ER 20 MEQ PO TBCR
40.0000 meq | EXTENDED_RELEASE_TABLET | Freq: Once | ORAL | Status: AC
  Administered 2019-09-19: 40 meq via ORAL
  Filled 2019-09-19: qty 2

## 2019-09-19 MED ORDER — BISOPROLOL-HYDROCHLOROTHIAZIDE 10-6.25 MG PO TABS
1.0000 | ORAL_TABLET | Freq: Every day | ORAL | Status: DC
  Filled 2019-09-19: qty 1

## 2019-09-19 MED ORDER — SODIUM CHLORIDE 0.9 % IV SOLN
1.0000 g | Freq: Two times a day (BID) | INTRAVENOUS | Status: DC
  Administered 2019-09-19 – 2019-09-21 (×4): 1 g via INTRAVENOUS
  Filled 2019-09-19: qty 1000
  Filled 2019-09-19: qty 1
  Filled 2019-09-19 (×4): qty 1000

## 2019-09-19 MED ORDER — WARFARIN - PHARMACIST DOSING INPATIENT: Freq: Every day | Status: DC

## 2019-09-19 MED ORDER — MAGNESIUM SULFATE 2 GM/50ML IV SOLN
2.0000 g | Freq: Once | INTRAVENOUS | Status: AC
  Administered 2019-09-19: 2 g via INTRAVENOUS
  Filled 2019-09-19: qty 50

## 2019-09-19 NOTE — Progress Notes (Signed)
Physical Therapy Evaluation Patient Details Name: Susan Potter MRN: [id number] DOB: 1926/05/09 Today's Date: 09/19/2019   History of Present Illness  Pt is a 86 y/o female admitted from SNF secondary to period of unresponsiveness. PMH includes DM, HTN, and a fib. Pt with recent R tibial plateau fx.   Clinical Impression  Pt admitted secondary to problem above with deficits below. Pt limited this session, as she reports she was not feeling well. Was agreeable to bed level assessment and HEP. Educated about and performed BLE HEP. Required assist for RLE movement. Pt from SNF and plan is to return. Will continue to follow acutely to maximize functional mobility independence and safety.     Follow Up Recommendations SNF;Supervision/Assistance - 24 hour    Equipment Recommendations  None recommended by PT    Recommendations for Other Services       Precautions / Restrictions Precautions Precautions: Fall Required Braces or Orthoses: Other Brace Other Brace: hinged knee brace Restrictions Weight Bearing Restrictions: Yes RLE Weight Bearing: Non weight bearing Other Position/Activity Restrictions: Per daughter, pt can perform knee ROM in R knee now.       Mobility  Bed Mobility               General bed mobility comments: Pt refusing bed mobility, but was agreeable to HEP.   Transfers                    Ambulation/Gait                Stairs            Wheelchair Mobility    Modified Rankin (Stroke Patients Only)       Balance                                             Pertinent Vitals/Pain Pain Assessment: No/denies pain    Home Living Family/patient expects to be discharged to:: Skilled nursing facility                 Additional Comments: Was at [facility]     Prior Function Level of Independence: Needs assistance   Gait / Transfers Assistance Needed: Was working with therapy at SNF. Assume total A for  transfers, as pt could not report.   ADL's / Homemaking Assistance Needed: Total A for ADLs        Hand Dominance        Extremity/Trunk Assessment   Upper Extremity Assessment Upper Extremity Assessment: Defer to OT evaluation    Lower Extremity Assessment Lower Extremity Assessment: RLE deficits/detail;Generalized weakness RLE Deficits / Details: Limited ROM noted when performing heel slide; required assist to perform.     Cervical / Trunk Assessment Cervical / Trunk Assessment: Normal  Communication   Communication: HOH  Cognition Arousal/Alertness: Awake/alert Behavior During Therapy: Flat affect Overall Cognitive Status: Impaired/Different from baseline Area of Impairment: Memory;Problem solving                     Memory: Decreased short-term memory       Problem Solving: Slow processing;Decreased initiation;Requires verbal cues General Comments: Per daughter, pt with likely dementia diagnosis per MD. Could not recall PLOF or what she was doing in rehab.       General Comments General comments (skin integrity, edema, etc.): Education to pt and pt's  daughter about performing HEP to maintain strength.     Exercises General Exercises - Upper Extremity Shoulder Flexion: AROM;Both;10 reps;Supine General Exercises - Lower Extremity Ankle Circles/Pumps: AROM;Both;20 reps;Supine Heel Slides: AAROM;Both;10 reps Hip ABduction/ADduction: AAROM;Both;10 reps;Supine   Assessment/Plan    PT Assessment Patient needs continued PT services  PT Problem List Decreased strength;Decreased range of motion;Decreased activity tolerance;Decreased balance;Decreased mobility;Decreased knowledge of use of DME;Decreased safety awareness;Decreased cognition;Decreased knowledge of precautions;Pain       PT Treatment Interventions DME instruction;Functional mobility training;Therapeutic activities;Therapeutic exercise;Balance training;Patient/family education;Cognitive  remediation    PT Goals (Current goals can be found in the Care Plan section)  Acute Rehab PT Goals Patient Stated Goal: for pt to return to SNF per daughter PT Goal Formulation: With family Time For Goal Achievement: 10/03/19 Potential to Achieve Goals: Good    Frequency Min 2X/week   Barriers to discharge        Co-evaluation               AM-PAC PT "6 Clicks" Mobility  Outcome Measure Help needed turning from your back to your side while in a flat bed without using bedrails?: Total Help needed moving from lying on your back to sitting on the side of a flat bed without using bedrails?: Total Help needed moving to and from a bed to a chair (including a wheelchair)?: Total Help needed standing up from a chair using your arms (e.g., wheelchair or bedside chair)?: Total Help needed to walk in hospital room?: Total Help needed climbing 3-5 steps with a railing? : Total 6 Click Score: 6    End of Session   Activity Tolerance: Patient limited by fatigue Patient left: in bed;with call bell/phone within reach;with bed alarm set Nurse Communication: Mobility status;Weight bearing status PT Visit Diagnosis: Difficulty in walking, not elsewhere classified (R26.2);Muscle weakness (generalized) (M62.81);Unsteadiness on feet (R26.81);History of falling (Z91.81)    Time: 1035-1050 PT Time Calculation (min) (ACUTE ONLY): 15 min   Charges:   PT Evaluation $PT Eval Moderate Complexity: 1 Mod          Farley Ly, PT, DPT  Acute Rehabilitation Services  Pager: [phone] Office: [phone]   Lehman Prom 09/19/2019, 11:03 AM

## 2019-09-19 NOTE — Progress Notes (Addendum)
PROGRESS NOTE    Susan Potter  KWI:[id number] DOB: 1926/11/18 DOA: 09/18/2019 PCP: [name], MD   Brief Narrative:  86 year old female with history of paroxysmal atrial fibrillation, diabetes mellitus, hypertension, recent admission for tibial plateau fracture and E. coli UTI, presented to the emergency department with complaints of altered mental status.  Work-up currently pending. Assessment & Plan   Acute metabolic encephalopathy -Unknown known etiology -CT head unremarkable -UA not suggestive of true infection however urine culture does show greater than 100,000 Enterococcus faecalis, currently pending sensitivities -Chest x-ray unremarkable for infection -Patient afebrile with no leukocytosis -Question whether this is due to underlying dementia with UTI -May be component of hypotension as patient's blood pressure was in the 70s to 90s systolically while I was in the room with her -Mental status does appear to be improving  Urinary tract infection -Urine culture showed greater than 100,000 Enterococcus faecalis -unsure if patient is currently symptomatic given her mental status  -will place on ampicillin and await sensitivities -Earlier this month, patient did have E. coli UTI was treated with ceftriaxone and Omnicef  Hypokalemia -Will replace and continue to monitor -Obtain magnesium level  Atrial fibrillation, paroxysmal with subtherapeutic INR -INR 1.9 on admission -Continue Coumadin per pharmacy -Currently rate controlled  Essential hypertension -As above, patient did appear to be hypotensive, will hold home medications [place]  Hyperlipidemia -Continue statin  Diabetes mellitus, type II -Home medications Janumet and glimepiride held -continue Lantus, insulin sliding scale CBG monitoring  GERD -Continue PPI  Osteoporosis -Continue oxygen  Recent tibial plateau fracture -Patient currently nonweightbearing on the right lower extremity -PT recommending  SNF  DVT Prophylaxis  Coumadin  Code Status: DNR  Family Communication: Daughter at bedside  Disposition Plan:  Status is: Inpatient  Remains inpatient appropriate because:IV treatments appropriate due to intensity of illness or inability to take PO   Dispo: The patient is from: SNF              Anticipated d/c is to: SNF              Anticipated d/c date is: 1 day              Patient currently is not medically stable to d/c.  Consultants None  Procedures  None  Antibiotics   Anti-infectives (From admission, onward)   None      Subjective:   Susan Potter seen and examined today.  Patient has no complaints today.  She does know that she is at the hospital but is unaware as to why.  Denies current chest pain, shortness of breath, abdominal pain, nausea or vomiting, diarrhea or constipation, dizziness or headache.    Objective:   Vitals:   09/19/19 0416 09/19/19 0421 09/19/19 0809 09/19/19 1100  BP: (!) 117/53   121/61  Pulse: 65  68 84  Resp: 13  20 20   Temp: (!) 97.5 F (36.4 C) (!) 97.5 F (36.4 C) 98.1 F (36.7 C) 98.3 F (36.8 C)  TempSrc: Oral Oral Oral Oral  SpO2: 96%  97% 94%  Weight:      Height:        Intake/Output Summary (Last 24 hours) at 09/19/2019 1310 Last data filed at 09/19/2019 1151 Gross per 24 hour  Intake 1000 ml  Output 300 ml  Net 700 ml   Filed Weights   09/18/19 1215  Weight: 98 kg    Exam  General: Well developed, well nourished, NAD, appears stated age  HEENT: NCAT, mucous  membranes moist.   Cardiovascular: S1 S2 auscultated, murmur, regular  Respiratory: Clear to auscultation bilaterally   Abdomen: Soft, nontender, nondistended, + bowel sounds  Extremities: warm dry without cyanosis clubbing.  Lower extremity edema.  Right lower extremity in immobilizer  Neuro: AAOx2 (self and place, not situation or recent history), nonfocal  Psych: Appropriate mood and affect   Data Reviewed: I have personally reviewed  following labs and imaging studies  CBC: Recent Labs  Lab 09/18/19 1221 09/19/19 0342  WBC 10.2 8.6  HGB 9.9* 9.3*  HCT 32.0* 29.5*  MCV 95.0 93.9  PLT 428* 407*   Basic Metabolic Panel: Recent Labs  Lab 09/18/19 1221 09/19/19 0342  NA 139 140  K 3.9 3.3*  CL 105 105  CO2 24 26  GLUCOSE 123* 65*  BUN 16 14  CREATININE 1.24* 1.00  CALCIUM 8.7* 8.5*   GFR: Estimated Creatinine Clearance: 44.1 mL/min (by C-G formula based on SCr of 1 mg/dL). Liver Function Tests: Recent Labs  Lab 09/18/19 1221 09/19/19 0342  AST 18 14*  ALT 10 10  ALKPHOS 78 80  BILITOT 0.9 1.2  PROT 6.5 5.9*  ALBUMIN 2.6* 2.5*   No results for input(s): LIPASE, AMYLASE in the last 168 hours. No results for input(s): AMMONIA in the last 168 hours. Coagulation Profile: Recent Labs  Lab 09/18/19 1221 09/19/19 0342  INR 1.9* 2.1*   Cardiac Enzymes: No results for input(s): CKTOTAL, CKMB, CKMBINDEX, TROPONINI in the last 168 hours. BNP (last 3 results) No results for input(s): PROBNP in the last 8760 hours. HbA1C: No results for input(s): HGBA1C in the last 72 hours. CBG: Recent Labs  Lab 09/18/19 1223 09/18/19 2203 09/19/19 0601 09/19/19 0812 09/19/19 1140  GLUCAP 110* 89 73 83 103*   Lipid Profile: No results for input(s): CHOL, HDL, LDLCALC, TRIG, CHOLHDL, LDLDIRECT in the last 72 hours. Thyroid Function Tests: No results for input(s): TSH, T4TOTAL, FREET4, T3FREE, THYROIDAB in the last 72 hours. Anemia Panel: No results for input(s): VITAMINB12, FOLATE, FERRITIN, TIBC, IRON, RETICCTPCT in the last 72 hours. Urine analysis:    Component Value Date/Time   COLORURINE YELLOW 09/18/2019 1544   APPEARANCEUR HAZY (A) 09/18/2019 1544   LABSPEC 1.011 09/18/2019 1544   PHURINE 5.0 09/18/2019 1544   GLUCOSEU NEGATIVE 09/18/2019 1544   GLUCOSEU NEGATIVE 09/12/2014 0815   HGBUR NEGATIVE 09/18/2019 1544   BILIRUBINUR NEGATIVE 09/18/2019 1544   KETONESUR NEGATIVE 09/18/2019 1544    PROTEINUR NEGATIVE 09/18/2019 1544   UROBILINOGEN 0.2 09/12/2014 0815   NITRITE NEGATIVE 09/18/2019 1544   LEUKOCYTESUR SMALL (A) 09/18/2019 1544   Sepsis Labs: @LABRCNTIP (procalcitonin:4,lacticidven:4)  ) Recent Results (from the past 240 hour(s))  SARS Coronavirus 2 by RT PCR (hospital order, performed in [facility] hospital lab) Nasopharyngeal Nasopharyngeal Swab     Status: None   Collection Time: 09/18/19  3:45 PM   Specimen: Nasopharyngeal Swab  Result Value Ref Range Status   SARS Coronavirus 2 NEGATIVE NEGATIVE Final    Comment: (NOTE) SARS-CoV-2 target nucleic acids are NOT DETECTED. The SARS-CoV-2 RNA is generally detectable in upper and lower respiratory specimens during the acute phase of infection. The lowest concentration of SARS-CoV-2 viral copies this assay can detect is 250 copies / mL. A negative result does not preclude SARS-CoV-2 infection and should not be used as the sole basis for treatment or other patient management decisions.  A negative result may occur with improper specimen collection / handling, submission of specimen other than nasopharyngeal swab, presence of  viral mutation(s) within the areas targeted by this assay, and inadequate number of viral copies (<250 copies / mL). A negative result must be combined with clinical observations, patient history, and epidemiological information. Fact Sheet for Patients:   BoilerBrush.com.cy Fact Sheet for Healthcare Providers: https://pope.com/ This test is not yet approved or cleared  by the [place] FDA and has been authorized for detection and/or diagnosis of SARS-CoV-2 by FDA under an Emergency Use Authorization (EUA).  This EUA will remain in effect (meaning this test can be used) for the duration of the COVID-19 declaration under Section 564(b)(1) of the Act, 21 U.S.C. section 360bbb-3(b)(1), unless the authorization is terminated or revoked  sooner. Performed at [facility], [address] [place], [place] [address]   Urine culture     Status: Abnormal (Preliminary result)   Collection Time: 09/18/19  3:47 PM   Specimen: Urine, Catheterized  Result Value Ref Range Status   Specimen Description URINE, CATHETERIZED  Final   Special Requests NONE  Final   Culture (A)  Final    >=100,000 COLONIES/mL ENTEROCOCCUS FAECALIS SUSCEPTIBILITIES TO FOLLOW Performed at [facility], [address], [place], [place] [address]    Report Status PENDING  Incomplete  MRSA PCR Screening     Status: None   Collection Time: 09/19/19  6:57 AM   Specimen: Nasopharyngeal  Result Value Ref Range Status   MRSA by PCR NEGATIVE NEGATIVE Final    Comment:        The GeneXpert MRSA Assay (FDA approved for NASAL specimens only), is one component of a comprehensive MRSA colonization surveillance program. It is not intended to diagnose MRSA infection nor to guide or monitor treatment for MRSA infections. Performed at [facility], [address] [place], [place] [address]       Radiology Studies: CT Head Wo Contrast  Result Date: 09/18/2019 CLINICAL DATA:  Altered mental status EXAM: CT HEAD WITHOUT CONTRAST TECHNIQUE: Contiguous axial images were obtained from the base of the skull through the vertex without intravenous contrast. COMPARISON:  08/29/2019 FINDINGS: Brain: No evidence of acute infarction, hemorrhage, extra-axial collection, ventriculomegaly, or mass effect. Small left basal ganglia lacunar infarct. Generalized cerebral atrophy. Periventricular white matter low attenuation likely secondary to microangiopathy. Vascular: Cerebrovascular atherosclerotic calcifications are noted. Skull: Negative for fracture or focal lesion. Sinuses/Orbits: Visualized portions of the orbits are unremarkable. Visualized portions of the paranasal sinuses are unremarkable. Visualized portions of the mastoid air cells are unremarkable.  Other: None. IMPRESSION: 1. No acute intracranial pathology. 2. Chronic microvascular disease and cerebral atrophy. Electronically Signed   By: Elige Ko   On: 09/18/2019 13:57   DG Chest Port 1 View  Result Date: 09/18/2019 CLINICAL DATA:  Altered mental status EXAM: PORTABLE CHEST 1 VIEW COMPARISON:  Aug 29, 2019. FINDINGS: Lungs are clear. Heart is upper normal in size with pulmonary vascularity normal. There is aortic atherosclerosis. No adenopathy. Bones are osteoporotic. IMPRESSION: Lungs clear. Heart upper normal in size. Aortic Atherosclerosis (ICD10-I70.0). Electronically Signed   By: Bretta Bang III M.D.   On: 09/18/2019 13:10     Scheduled Meds: . atorvastatin  10 mg Oral Daily  . bisoprolol-hydrochlorothiazide  1 tablet Oral Daily  . insulin aspart  0-9 Units Subcutaneous TID WC  . insulin glargine  10 Units Subcutaneous QHS  . pantoprazole  40 mg Oral Daily  . potassium chloride  40 mEq Oral Once  . raloxifene  60 mg Oral Daily  .  warfarin  2.5 mg Oral Daily  . Warfarin - Physician Dosing Inpatient   Does not apply q1600   Continuous Infusions:   LOS: 1 day   Time Spent in minutes   45 minutes  Dazhane Villagomez D.O. on 09/19/2019 at 1:10 PM  Between 7am to 7pm - Please see pager noted on [email or web address]  After 7pm go to [email or web address]  And look for the night coverage person covering for me after hours  [facility] Office  [phone]

## 2019-09-19 NOTE — NC FL2 (Signed)
[place] LEVEL OF CARE SCREENING TOOL     IDENTIFICATION  Patient Name: Susan Potter Birthdate: May 25, 1926 Sex: female Admission Date (Current Location): 09/18/2019  [facility] and [place] Number:  Herbalist and Address:  The . [facility], [place] [address] [place], [place] [address]      Provider Number: [id number]  Attending Physician Name and Address:  Cristal Ford, DO  Relative Name and Phone Number:       Current Level of Care: Hospital Recommended Level of Care: [place] Prior Approval Number:    Date Approved/Denied:   PASRR Number: [id number]  Discharge Plan:      Current Diagnoses: Patient Active Problem List   Diagnosis Date Noted  . AMS (altered mental status) 09/18/2019  . E. coli UTI   . Hypomagnesemia 08/30/2019  . Acute lower UTI 08/30/2019  . Vitamin D deficiency 08/30/2019  . AF (paroxysmal atrial fibrillation) ([place]) 08/29/2019  . Type 2 diabetes mellitus without complication, without long-term current use of insulin ([place]) 08/29/2019  . Mixed diabetic hyperlipidemia associated with type 2 diabetes mellitus ([place]) 08/29/2019  . Essential hypertension 08/29/2019  . Fall at home, initial encounter 08/29/2019  . Tibial plateau fracture, right, closed, initial encounter 08/29/2019  . Fracture of right tibial plateau, closed, initial encounter 08/29/2019  . Age-related osteoporosis without current pathological fracture 10/15/2017  . Encounter for therapeutic drug monitoring 05/12/2013  . Long term (current) use of anticoagulants 02/08/2013  . Type II or unspecified type diabetes mellitus without mention of complication, uncontrolled 11/08/2012  . Unspecified essential hypertension 11/08/2012  . Pure hypercholesterolemia 11/08/2012  . Atrial fibrillation ([place]) 11/08/2012  . Osteoporosis, unspecified 11/08/2012    Orientation RESPIRATION BLADDER Height & Weight     Self, Situation, Place   Normal Continent Weight: 216 lb 0.8 oz (98 kg) Height:  5\' 7"  (170.2 cm)  BEHAVIORAL SYMPTOMS/MOOD NEUROLOGICAL BOWEL NUTRITION STATUS      Continent Diet(see DC summary)  AMBULATORY STATUS COMMUNICATION OF NEEDS Skin   Extensive Assist(WBAT) Verbally Normal                       Personal Care Assistance Level of Assistance  Bathing, Feeding, Dressing Bathing Assistance: Maximum assistance Feeding assistance: Independent Dressing Assistance: Maximum assistance     Functional Limitations Info  Sight, Hearing, Speech Sight Info: Adequate Hearing Info: Adequate Speech Info: Adequate    SPECIAL CARE FACTORS FREQUENCY  PT (By licensed PT), OT (By licensed OT)     PT Frequency: 5x/wk OT Frequency: 5x/wk            Contractures Contractures Info: Not present    Additional Factors Info  Code Status, Allergies, Insulin Sliding Scale Code Status Info: DNR Allergies Info: NKA   Insulin Sliding Scale Info: 0-9 units 3x/day with meals; Lantus 10 units daily at bed       Current Medications (09/19/2019):  This is the current hospital active medication list Current Facility-Administered Medications  Medication Dose Route Frequency Provider Last Rate Last Admin  . acetaminophen (TYLENOL) tablet 650 mg  650 mg Oral Q6H PRN Clarnce Flock, MD   650 mg at 09/18/19 2200   Or  . acetaminophen (TYLENOL) suppository 650 mg  650 mg Rectal Q6H PRN Clarnce Flock, MD      . ampicillin (OMNIPEN) 1 g in sodium chloride 0.9 % 100 mL IVPB  1 g Intravenous Q12H Skeet Simmer, RPH 300 mL/hr at  09/19/19 1620 1 g at 09/19/19 1620  . atorvastatin (LIPITOR) tablet 10 mg  10 mg Oral Daily Venora Maples, MD   10 mg at 09/18/19 2200  . insulin aspart (novoLOG) injection 0-9 Units  0-9 Units Subcutaneous TID WC Venora Maples, MD      . insulin glargine (LANTUS) injection 10 Units  10 Units Subcutaneous QHS Venora Maples, MD   10 Units at 09/18/19 2200  . magnesium sulfate  IVPB 2 g 50 mL  2 g Intravenous Once Edsel Petrin, DO      . ondansetron [facility]) tablet 4 mg  4 mg Oral Q6H PRN Venora Maples, MD       Or  . ondansetron [facility]) injection 4 mg  4 mg Intravenous Q6H PRN Venora Maples, MD      . pantoprazole (PROTONIX) EC tablet 40 mg  40 mg Oral Daily Venora Maples, MD   40 mg at 09/19/19 4401  . polyethylene glycol (MIRALAX / GLYCOLAX) packet 17 g  17 g Oral Daily PRN Venora Maples, MD      . raloxifene (EVISTA) tablet 60 mg  60 mg Oral Daily Venora Maples, MD   60 mg at 09/19/19 1100  . warfarin (COUMADIN) tablet 2.5 mg  2.5 mg Oral Daily [place], Hoyle Sauer, MD      . Warfarin - Pharmacist Dosing Inpatient   Does not apply q1600 Scarlett Presto, [facility]         Discharge Medications: Please see discharge summary for a list of discharge medications.  Relevant Imaging Results:  Relevant Lab Results:   Additional Information SS#: [id number]  Baldemar Lenis, LCSW

## 2019-09-19 NOTE — Progress Notes (Signed)
ANTICOAGULATION + ANTIBIOTIC CONSULT NOTE - Initial Consult  Pharmacy Consult for Warfarin and Ampicillin Indication: atrial fibrillation and Enterococcal UTI  No Known Allergies  Patient Measurements: Height: 5\' 7"  (170.2 cm) Weight: 98 kg (216 lb 0.8 oz) IBW/kg (Calculated) : 61.6  Vital Signs: Temp: 98.3 F (36.8 C) (06/01 1100) Temp Source: Oral (06/01 1100) BP: 121/61 (06/01 1100) Pulse Rate: 84 (06/01 1100)  Labs: Recent Labs    09/18/19 1221 09/19/19 0342  HGB 9.9* 9.3*  HCT 32.0* 29.5*  PLT 428* 407*  APTT  --  33  LABPROT 21.2* 22.5*  INR 1.9* 2.1*  CREATININE 1.24* 1.00    Estimated Creatinine Clearance: 44.1 mL/min (by C-G formula based on SCr of 1 mg/dL).   Medical History: Past Medical History:  Diagnosis Date  . AF (paroxysmal atrial fibrillation) (HCC) 08/29/2019  . Atrial fibrillation (HCC)   . Diabetes mellitus without complication (HCC)   . Hypertension   . Mixed diabetic hyperlipidemia associated with type 2 diabetes mellitus (HCC) 08/29/2019  . Osteoporosis   . Retinal vein occlusion   . Type 2 diabetes mellitus without complication, without long-term current use of insulin (HCC) 08/29/2019  . Vitamin D deficiency    Assessment:  86 yr old female on Warfarin 2.5 mg daily prior to admission for atrial fibrillation.  INR 1.9 on admit and warfarin 5 mg x 1 given.  INR 2.1 today.       5/31 urine culture growing Enterococcus. Sensitivities pending. Hx E coli UTI 5/12, treated with Ceftriaxone then Cefdinir for 5 days.  Afebrile, WBC 8.6.   Goal of Therapy:  INR 2-3 appropriate Ampicillin dose for infection and renal function Monitor platelets by anticoagulation protocol: Yes   Plan:   Continue Warfarin 2.5 mg daily at 4pm.  Daily PT/INR for now.  Ampicillin 1 gm IV q12hrs.  Follow up final culture report.  7/12, RPh Phone: [phone] 09/19/2019,2:41 PM

## 2019-09-19 NOTE — TOC Initial Note (Signed)
Transition of Care [facility]) - Initial/Assessment Note    Patient Details  Name: Susan Potter MRN: [id number] Date of Birth: 03-Jul-1926  Transition of Care [facility]) CM/SW Contact:    Geralynn Ochs, LCSW Phone Number: 09/19/2019, 3:35 PM  Clinical Narrative:      CSW met with patient's daughter at bedside to confirm plan to return to [facility] at discharge. CSW confirmed with [facility] ability to take patient back. CSW faxed insurance authorization request to [facility] Medicare. CSW to follow.             Expected Discharge Plan: Skilled Nursing Facility Barriers to Discharge: Insurance Authorization, Continued Medical Work up   Patient Goals and CMS Choice Patient states their goals for this hospitalization and ongoing recovery are:: patient unable to participate in goal setting due to disorientation CMS Medicare.gov Compare Post Acute Care list provided to:: Patient Represenative (must comment) Choice offered to / list presented to : Adult Children  Expected Discharge Plan and Services Expected Discharge Plan: [place] Choice: [place] arrangements for the past 2 months: Single Family Home                                      Prior Living Arrangements/Services Living arrangements for the past 2 months: Single Family Home Lives with:: Self Patient language and need for interpreter reviewed:: No Do you feel safe going back to the place where you live?: Yes      Need for Family Participation in Patient Care: Yes (Comment) Care giver support system in place?: No (comment)   Criminal Activity/Legal Involvement Pertinent to Current Situation/Hospitalization: No - Comment as needed  Activities of Daily Living Home Assistive Devices/Equipment: Cane (specify quad or straight), Walker (specify type), Wheelchair, Hospital bed, Eyeglasses ADL Screening (condition at time of admission) Patient's cognitive ability adequate to  safely complete daily activities?: Yes Is the patient deaf or have difficulty hearing?: Yes Does the patient have difficulty seeing, even when wearing glasses/contacts?: No Does the patient have difficulty concentrating, remembering, or making decisions?: Yes Patient able to express need for assistance with ADLs?: Yes Does the patient have difficulty dressing or bathing?: No Independently performs ADLs?: Yes (appropriate for developmental age) Does the patient have difficulty walking or climbing stairs?: Yes Weakness of Legs: Both Weakness of Arms/Hands: None  Permission Sought/Granted Permission sought to share information with : Facility Sport and exercise psychologist, Family Supports Permission granted to share information with : Yes, Verbal Permission Granted  Share Information with NAME: [name]  Permission granted to share info w AGENCY: [facility]  Permission granted to share info w Relationship: Daughter     Emotional Assessment Appearance:: Appears stated age Attitude/Demeanor/Rapport: Unable to Assess Affect (typically observed): Unable to Assess Orientation: : Oriented to Self, Oriented to Place, Oriented to Situation Alcohol / Substance Use: Not Applicable Psych Involvement: No (comment)  Admission diagnosis:  Somnolence [R40.0] AMS (altered mental status) [R41.82] Patient Active Problem List   Diagnosis Date Noted  . AMS (altered mental status) 09/18/2019  . E. coli UTI   . Hypomagnesemia 08/30/2019  . Acute lower UTI 08/30/2019  . Vitamin D deficiency 08/30/2019  . AF (paroxysmal atrial fibrillation) ([place]) 08/29/2019  . Type 2 diabetes mellitus without complication, without long-term current use of insulin ([place]) 08/29/2019  . Mixed diabetic hyperlipidemia associated with type 2 diabetes mellitus ([place]) 08/29/2019  . Essential  hypertension 08/29/2019  . Fall at home, initial encounter 08/29/2019  . Tibial plateau fracture, right, closed, initial encounter 08/29/2019  .  Fracture of right tibial plateau, closed, initial encounter 08/29/2019  . Age-related osteoporosis without current pathological fracture 10/15/2017  . Encounter for therapeutic drug monitoring 05/12/2013  . Long term (current) use of anticoagulants 02/08/2013  . Type II or unspecified type diabetes mellitus without mention of complication, uncontrolled 11/08/2012  . Unspecified essential hypertension 11/08/2012  . Pure hypercholesterolemia 11/08/2012  . Atrial fibrillation ([place]) 11/08/2012  . Osteoporosis, unspecified 11/08/2012   PCP:  Elayne Snare, MD Pharmacy:   [facility] [phone] [place], [place] [address] [place] [address] Phone: [phone] Fax: [phone]     Social Determinants of Health ([place]) Interventions    Readmission Risk Interventions No flowsheet data found.

## 2019-09-20 LAB — URINE CULTURE: Culture: >=100000 — AB

## 2019-09-20 LAB — CBC
HCT: 31.6 % — ABNORMAL LOW (ref 36.0–46.0)
Hemoglobin: 9.8 g/dL — ABNORMAL LOW (ref 12.0–15.0)
MCH: 29.3 pg (ref 26.0–34.0)
MCHC: 31 g/dL (ref 30.0–36.0)
MCV: 94.3 fL (ref 80.0–100.0)
Platelets: 381 10*3/uL (ref 150–400)
RBC: 3.35 MIL/uL — ABNORMAL LOW (ref 3.87–5.11)
RDW: 14.8 % (ref 11.5–15.5)
WBC: 9.3 10*3/uL (ref 4.0–10.5)
nRBC: 0 % (ref 0.0–0.2)

## 2019-09-20 LAB — GLUCOSE, CAPILLARY
Glucose-Capillary: 171 mg/dL — ABNORMAL HIGH (ref 70–99)
Glucose-Capillary: 165 mg/dL — ABNORMAL HIGH (ref 70–99)
Glucose-Capillary: 116 mg/dL — ABNORMAL HIGH (ref 70–99)
Glucose-Capillary: 153 mg/dL — ABNORMAL HIGH (ref 70–99)

## 2019-09-20 LAB — BASIC METABOLIC PANEL
Anion gap: 10 (ref 5–15)
BUN: 13 mg/dL (ref 8–23)
CO2: 26 mmol/L (ref 22–32)
Calcium: 8.7 mg/dL — ABNORMAL LOW (ref 8.9–10.3)
Chloride: 104 mmol/L (ref 98–111)
Creatinine, Ser: 1.11 mg/dL — ABNORMAL HIGH (ref 0.44–1.00)
GFR calc Af Amer: 50 mL/min — ABNORMAL LOW (ref >60)
GFR calc non Af Amer: 43 mL/min — ABNORMAL LOW (ref >60)
Glucose, Bld: 157 mg/dL — ABNORMAL HIGH (ref 70–99)
Potassium: 4.1 mmol/L (ref 3.5–5.1)
Sodium: 140 mmol/L (ref 135–145)

## 2019-09-20 LAB — MAGNESIUM: Magnesium: 2 mg/dL (ref 1.7–2.4)

## 2019-09-20 LAB — PROTIME-INR
INR: 2.1 — ABNORMAL HIGH (ref 0.8–1.2)
Prothrombin Time: 23.1 seconds — ABNORMAL HIGH (ref 11.4–15.2)

## 2019-09-20 NOTE — Evaluation (Signed)
Occupational Therapy Evaluation Patient Details Name: Susan Potter MRN: [id number] DOB: Apr 10, 1927 Today's Date: 09/20/2019    History of Present Illness Pt is a 86 y/o female admitted from SNF secondary to period of unresponsiveness. PMH includes DM, HTN, and a fib. Pt with recent R tibial plateau fx.    Clinical Impression   This 86 y/o female presents with the above. Pt most recently in SNF ([facility]) for ST rehab due to tibial plateau fx. Pt now presenting with overall weakness, impaired cognition and decreased mobility status. Pt requiring maxA for rolling at bed level, currently requiring totalA for LB ADL and at least minA for UB ADL. Pt participating in additional bed level exercise for continued strengthening/endurance (pt daughter present and reports having completed majority of ADL at this time).  She will benefit from continued acute OT services and recommend pt return to SNF for continued therapies at time of discharge to progress pt towards her PLOF. Will follow.     Follow Up Recommendations  SNF;Supervision/Assistance - 24 hour    Equipment Recommendations  Other (comment);3 in 1 bedside commode;Wheelchair (measurements OT);Wheelchair cushion (measurements OT)(defer to next venue)           Precautions / Restrictions Precautions Precautions: Fall Required Braces or Orthoses: Other Brace Other Brace: hinged knee brace Restrictions Weight Bearing Restrictions: Yes RLE Weight Bearing: Non weight bearing Other Position/Activity Restrictions: Per daughter, pt can perform knee ROM in R knee now.       Mobility Bed Mobility Overal bed mobility: Needs Assistance Bed Mobility: Rolling Rolling: Max assist         General bed mobility comments: pt requiring maxA for rolling towards R side, cues for technique. Suspect will require two person assist to safely attempt EOB   Transfers                 General transfer comment: not attempted today    Balance                                            ADL either performed or assessed with clinical judgement   ADL Overall ADL's : Needs assistance/impaired Eating/Feeding: Set up;Sitting   Grooming: Set up;Sitting   Upper Body Bathing: Minimal assistance;Sitting   Lower Body Bathing: Total assistance;Bed level   Upper Body Dressing : Minimal assistance;Sitting;Bed level   Lower Body Dressing: Total assistance;Bed level       Toileting- Clothing Manipulation and Hygiene: Total assistance;Bed level         General ADL Comments: pt tolerating bed level activity today, sleeping on arrival but agreeable to working with therapist      Vision         Perception     Praxis      Pertinent Vitals/Pain Pain Assessment: No/denies pain     Hand Dominance     Extremity/Trunk Assessment Upper Extremity Assessment Upper Extremity Assessment: Generalized weakness   Lower Extremity Assessment Lower Extremity Assessment: Defer to PT evaluation   Cervical / Trunk Assessment Cervical / Trunk Assessment: Normal   Communication Communication Communication: HOH   Cognition Arousal/Alertness: Awake/alert Behavior During Therapy: Flat affect Overall Cognitive Status: Impaired/Different from baseline Area of Impairment: Memory;Problem solving                     Memory: Decreased short-term memory  Problem Solving: Slow processing;Decreased initiation;Requires verbal cues General Comments: Per daughter, pt with likely dementia diagnosis per MD. Could not recall PLOF or what she was doing in rehab.    General Comments       Exercises Exercises: General Lower Extremity;General Upper Extremity;Other exercises General Exercises - Upper Extremity Shoulder Flexion: AROM;Both;10 reps;Supine Shoulder Horizontal ABduction: AROM;Both;10 reps Shoulder Horizontal ADduction: AROM;Both;10 reps General Exercises - Lower Extremity Ankle Circles/Pumps:  AROM;Both;Supine;10 reps Hip Flexion/Marching: Left;10 reps;Supine Other Exercises Other Exercises: worked on partial bridging using LLE only   Shoulder Instructions      Home Living Family/patient expects to be discharged to:: Skilled nursing facility                                 Additional Comments: Was at [facility], pt with hx of cognitive impairments, reports she was working on getting up to w/c at facility but not able to provide many details, suspect requiring assist for ADL       Prior Functioning/Environment Level of Independence: Needs assistance  Gait / Transfers Assistance Needed: Was working with therapy at SNF. Assume total A for transfers, as pt could not report.  ADL's / Homemaking Assistance Needed: Total A for ADLs            OT Problem List: Decreased strength;Decreased range of motion;Decreased activity tolerance;Impaired balance (sitting and/or standing);Decreased cognition;Decreased safety awareness;Decreased knowledge of use of DME or AE;Decreased knowledge of precautions;Obesity;Pain      OT Treatment/Interventions: Self-care/ADL training;Therapeutic exercise;Energy conservation;DME and/or AE instruction;Therapeutic activities;Cognitive remediation/compensation;Patient/family education;Balance training    OT Goals(Current goals can be found in the care plan section) Acute Rehab OT Goals Patient Stated Goal: for pt to return to SNF per daughter OT Goal Formulation: With patient/family Time For Goal Achievement: 10/04/19 Potential to Achieve Goals: Good  OT Frequency: Min 2X/week   Barriers to D/C:            Co-evaluation              AM-PAC OT "6 Clicks" Daily Activity     Outcome Measure Help from another person eating meals?: A Little Help from another person taking care of personal grooming?: A Little Help from another person toileting, which includes using toliet, bedpan, or urinal?: Total Help from another person bathing  (including washing, rinsing, drying)?: A Lot Help from another person to put on and taking off regular upper body clothing?: A Little Help from another person to put on and taking off regular lower body clothing?: Total 6 Click Score: 13   End of Session Nurse Communication: Mobility status  Activity Tolerance: Patient tolerated treatment well Patient left: in bed;with call bell/phone within reach;with bed alarm set  OT Visit Diagnosis: Other abnormalities of gait and mobility (R26.89);Muscle weakness (generalized) (M62.81);Other symptoms and signs involving cognitive function                Time: 1520-1535 OT Time Calculation (min): 15 min Charges:  OT General Charges $OT Visit: 1 Visit OT Evaluation $OT Eval Moderate Complexity: 1 Mod  Susan Potter, OT Acute Rehabilitation Services Pager [phone] Office [phone]   Orlando Penner 09/20/2019, 4:18 PM

## 2019-09-20 NOTE — Progress Notes (Signed)
ANTICOAGULATION + ANTIBIOTIC CONSULT NOTE - Follow Up Consult  Pharmacy Consult for Warfarin and Ampicillin Indication: atrial fibrillation and Enterococcal UTI  No Known Allergies  Patient Measurements: Height: 5\' 7"  (170.2 cm) Weight: 98 kg (216 lb 0.8 oz) IBW/kg (Calculated) : 61.6  Vital Signs: Temp: 97.6 F (36.4 C) (06/02 0718) Temp Source: Oral (06/02 0718) BP: 117/69 (06/02 0718) Pulse Rate: 71 (06/02 0718)  Labs: Recent Labs    09/18/19 1221 09/18/19 1221 09/19/19 0342 09/20/19 0644  HGB 9.9*   < > 9.3* 9.8*  HCT 32.0*  --  29.5* 31.6*  PLT 428*  --  407* 381  APTT  --   --  33  --   LABPROT 21.2*  --  22.5* 23.1*  INR 1.9*  --  2.1* 2.1*  CREATININE 1.24*  --  1.00 1.11*   < > = values in this interval not displayed.    Estimated Creatinine Clearance: 39.7 mL/min (A) (by C-G formula based on SCr of 1.11 mg/dL (H)).  Assessment:   86 yr old female on Warfarin 2.5 mg daily prior to admission for atrial fibrillation.  INR 1.9 on admit and warfarin 5 mg x 1 given. Warfarin 2.5 mg daily resumed on 6/1 when  INR 2.1,  INR 2.1 again today.     5/31 urine culture grew Enterococcus, sensitivities now reported and sensitive to Ampicillin, which was started IV on 6/1. Hx E coli UTI 5/12, treated with Ceftriaxone then Cefdinir for 5 days.     Goal of Therapy:  INR 2-3 appropriate Ampicillin dose for infection and renal function Monitor platelets by anticoagulation protocol: Yes   Plan:   Continue Warfarin 2.5 mg daily at 4pm.  Daily PT/INR for now.  Continue Ampicillin 1 gm IV q12hrs.  Follow renal function, clinical progress and length of therapy.  7/12, RPh Phone: [phone] 09/20/2019,11:51 AM

## 2019-09-20 NOTE — Progress Notes (Signed)
PROGRESS NOTE    Susan Potter  NWG:[id number] DOB: Apr 06, 1927 DOA: 09/18/2019 PCP: Elayne Snare, MD   Brief Narrative: Patient is a 86 year old female with history of paroxysmal A. fib, diabetes mellitus, hypertension, recent admission for tibial plateau fracture, E. coli UTI who presented to the emergency part with complaints of altered mental status from skilled nursing facility.  She was found to have Enterococcus faecalis UTI.  Mental status has improved.  Planning to send her back to skilled facility tomorrow with oral antibiotics.  Assessment & Plan:   Active Problems:   Atrial fibrillation (HCC)   Long term (current) use of anticoagulants   Age-related osteoporosis without current pathological fracture   Type 2 diabetes mellitus without complication, without long-term current use of insulin (HCC)   Mixed diabetic hyperlipidemia associated with type 2 diabetes mellitus ([place])   Essential hypertension   AMS (altered mental status)   Acute metabolic encephalopathy: Most likely secondary to Enterococcus faecalis UTI.  Currently she is alert and oriented.  CT head did not show any acute intracranial abnormalities.  Chest x-ray was unremarkable for pneumonia.  There is questionable history of underlying dementia though there is no documented history.  UTI: Urine culture showed Enterococcus faecalis sensitive to ampicillin.  Continue IV antibiotics for today.  Change to oral tomorrow.  Blood cultures negative so far.  Hypokalemia: Supplemented  Paroxysmal A. fib: On Coumadin for anticoagulation.  Currently rate is controlled.  Hypertension: Currently blood pressure stable.  Was hypotensive on presentation.  Monitor blood pressure  Hyperlipidemia: Continue statin  Diabetes type 2: On Janumet, glimepiride at home.  Currently on insulin here.  GERD: Continue PPI  Osteoporosis: Continue calcium, vitamin D supplementation.  Recent tibial plateau fracture: Currently not weightbearing  on the right lower extremity.  Follows with orthopedics as an outpatient.  Currently residing is nursing facility for rehabilitation.         DVT prophylaxis:Coumadin Code Status: Full Family Communication: Discussed with daughter at the bedside Status is: Inpatient  Remains inpatient appropriate because:Unsafe d/c plan   Dispo: The patient is from: SNF              Anticipated d/c is to: SNF              Anticipated d/c date is: 1 day              Patient currently is medically stable to d/c.  Waiting for insurance authorization, updated PT evaluation    Consultants: None  Procedures:None  Antimicrobials:  Anti-infectives (From admission, onward)   Start     Dose/Rate Route Frequency Ordered Stop   09/19/19 1500  ampicillin (OMNIPEN) 1 g in sodium chloride 0.9 % 100 mL IVPB     1 g 300 mL/hr over 20 Minutes Intravenous Every 12 hours 09/19/19 1438        Subjective: Patient seen and examined at the bedside this morning.  Hemodynamically stable.  Comfortable.  Sleeping during my evaluation but readily woke up on calling her name.  Currently alert and oriented.  Objective: Vitals:   09/20/19 0027 09/20/19 0306 09/20/19 0718 09/20/19 1117  BP: 115/75 130/66 117/69   Pulse: 78 74 71 83  Resp: 19 17 18 18   Temp: 99 F (37.2 C) 98.4 F (36.9 C) 97.6 F (36.4 C) 98.7 F (37.1 C)  TempSrc: Oral Oral Oral Oral  SpO2: 95% 97% 97% 95%  Weight:      Height:        Intake/Output  Summary (Last 24 hours) at 09/20/2019 1340 Last data filed at 09/20/2019 1151 Gross per 24 hour  Intake 175.9 ml  Output 950 ml  Net -774.1 ml   Filed Weights   09/18/19 1215  Weight: 98 kg    Examination:  General exam: Not in distress,obese HEENT:PERRL,Oral mucosa moist, Ear/Nose normal on gross exam Respiratory system: Bilateral equal air entry, normal vesicular breath sounds, no wheezes or crackles  Cardiovascular system: Irregularly irregular rhythm, no JVD, murmurs, rubs,  gallops or clicks. No pedal edema. Gastrointestinal system: Abdomen is nondistended, soft and nontender. No organomegaly or masses felt. Normal bowel sounds heard. Central nervous system: Alert and oriented. No focal neurological deficits. Extremities: No edema, no clubbing ,no cyanosis Skin: No rashes, lesions or ulcers,no icterus ,no pallor   Data Reviewed: I have personally reviewed following labs and imaging studies  CBC: Recent Labs  Lab 09/18/19 1221 09/19/19 0342 09/20/19 0644  WBC 10.2 8.6 9.3  HGB 9.9* 9.3* 9.8*  HCT 32.0* 29.5* 31.6*  MCV 95.0 93.9 94.3  PLT 428* 407* 381   Basic Metabolic Panel: Recent Labs  Lab 09/18/19 1221 09/19/19 0342 09/19/19 1333 09/20/19 0644  NA 139 140  --  140  K 3.9 3.3*  --  4.1  CL 105 105  --  104  CO2 24 26  --  26  GLUCOSE 123* 65*  --  157*  BUN 16 14  --  13  CREATININE 1.24* 1.00  --  1.11*  CALCIUM 8.7* 8.5*  --  8.7*  MG  --   --  1.6* 2.0   GFR: Estimated Creatinine Clearance: 39.7 mL/min (A) (by C-G formula based on SCr of 1.11 mg/dL (H)). Liver Function Tests: Recent Labs  Lab 09/18/19 1221 09/19/19 0342  AST 18 14*  ALT 10 10  ALKPHOS 78 80  BILITOT 0.9 1.2  PROT 6.5 5.9*  ALBUMIN 2.6* 2.5*   No results for input(s): LIPASE, AMYLASE in the last 168 hours. No results for input(s): AMMONIA in the last 168 hours. Coagulation Profile: Recent Labs  Lab 09/18/19 1221 09/19/19 0342 09/20/19 0644  INR 1.9* 2.1* 2.1*   Cardiac Enzymes: No results for input(s): CKTOTAL, CKMB, CKMBINDEX, TROPONINI in the last 168 hours. BNP (last 3 results) No results for input(s): PROBNP in the last 8760 hours. HbA1C: No results for input(s): HGBA1C in the last 72 hours. CBG: Recent Labs  Lab 09/19/19 1140 09/19/19 1553 09/19/19 2126 09/20/19 0626 09/20/19 1155  GLUCAP 103* 189* 147* 171* 165*   Lipid Profile: No results for input(s): CHOL, HDL, LDLCALC, TRIG, CHOLHDL, LDLDIRECT in the last 72 hours. Thyroid  Function Tests: No results for input(s): TSH, T4TOTAL, FREET4, T3FREE, THYROIDAB in the last 72 hours. Anemia Panel: No results for input(s): VITAMINB12, FOLATE, FERRITIN, TIBC, IRON, RETICCTPCT in the last 72 hours. Sepsis Labs: No results for input(s): PROCALCITON, LATICACIDVEN in the last 168 hours.  Recent Results (from the past 240 hour(s))  SARS Coronavirus 2 by RT PCR (hospital order, performed in [facility] hospital lab) Nasopharyngeal Nasopharyngeal Swab     Status: None   Collection Time: 09/18/19  3:45 PM   Specimen: Nasopharyngeal Swab  Result Value Ref Range Status   SARS Coronavirus 2 NEGATIVE NEGATIVE Final    Comment: (NOTE) SARS-CoV-2 target nucleic acids are NOT DETECTED. The SARS-CoV-2 RNA is generally detectable in upper and lower respiratory specimens during the acute phase of infection. The lowest concentration of SARS-CoV-2 viral copies this assay can detect is  250 copies / mL. A negative result does not preclude SARS-CoV-2 infection and should not be used as the sole basis for treatment or other patient management decisions.  A negative result may occur with improper specimen collection / handling, submission of specimen other than nasopharyngeal swab, presence of viral mutation(s) within the areas targeted by this assay, and inadequate number of viral copies (<250 copies / mL). A negative result must be combined with clinical observations, patient history, and epidemiological information. Fact Sheet for Patients:   BoilerBrush.com.cy Fact Sheet for Healthcare Providers: https://pope.com/ This test is not yet approved or cleared  by the [place] FDA and has been authorized for detection and/or diagnosis of SARS-CoV-2 by FDA under an Emergency Use Authorization (EUA).  This EUA will remain in effect (meaning this test can be used) for the duration of the COVID-19 declaration under Section 564(b)(1) of the  Act, 21 U.S.C. section 360bbb-3(b)(1), unless the authorization is terminated or revoked sooner. Performed at [facility], [address], [place], [place] [address]   Urine culture     Status: Abnormal   Collection Time: 09/18/19  3:47 PM   Specimen: Urine, Catheterized  Result Value Ref Range Status   Specimen Description URINE, CATHETERIZED  Final   Special Requests   Final    NONE Performed at [facility], [address], [place], [place] [address]    Culture >=100,000 COLONIES/mL ENTEROCOCCUS FAECALIS (A)  Final   Report Status 09/20/2019 FINAL  Final   Organism ID, Bacteria ENTEROCOCCUS FAECALIS (A)  Final      Susceptibility   Enterococcus faecalis - MIC*    AMPICILLIN <=2 SENSITIVE Sensitive     NITROFURANTOIN <=16 SENSITIVE Sensitive     VANCOMYCIN 1 SENSITIVE Sensitive     * >=100,000 COLONIES/mL ENTEROCOCCUS FAECALIS  MRSA PCR Screening     Status: None   Collection Time: 09/19/19  6:57 AM   Specimen: Nasopharyngeal  Result Value Ref Range Status   MRSA by PCR NEGATIVE NEGATIVE Final    Comment:        The GeneXpert MRSA Assay (FDA approved for NASAL specimens only), is one component of a comprehensive MRSA colonization surveillance program. It is not intended to diagnose MRSA infection nor to guide or monitor treatment for MRSA infections. Performed at [facility], [address], [place], [place] [address]          Radiology Studies: CT Head Wo Contrast  Result Date: 09/18/2019 CLINICAL DATA:  Altered mental status EXAM: CT HEAD WITHOUT CONTRAST TECHNIQUE: Contiguous axial images were obtained from the base of the skull through the vertex without intravenous contrast. COMPARISON:  08/29/2019 FINDINGS: Brain: No evidence of acute infarction, hemorrhage, extra-axial collection, ventriculomegaly, or mass effect. Small left basal ganglia lacunar infarct. Generalized cerebral atrophy. Periventricular white matter low attenuation likely  secondary to microangiopathy. Vascular: Cerebrovascular atherosclerotic calcifications are noted. Skull: Negative for fracture or focal lesion. Sinuses/Orbits: Visualized portions of the orbits are unremarkable. Visualized portions of the paranasal sinuses are unremarkable. Visualized portions of the mastoid air cells are unremarkable. Other: None. IMPRESSION: 1. No acute intracranial pathology. 2. Chronic microvascular disease and cerebral atrophy. Electronically Signed   By: Elige Ko   On: 09/18/2019 13:57        Scheduled Meds: . atorvastatin  10 mg Oral Daily  . insulin aspart  0-9 Units Subcutaneous TID WC  . insulin glargine  10 Units Subcutaneous QHS  . pantoprazole  40  mg Oral Daily  . raloxifene  60 mg Oral Daily  . warfarin  2.5 mg Oral Daily  . Warfarin - Pharmacist Dosing Inpatient   Does not apply q1600   Continuous Infusions: . ampicillin (OMNIPEN) IV 1 g (09/20/19 0424)     LOS: 2 days    Time spent:25 mins. More than 50% of that time was spent in counseling and/or coordination of care.      Burnadette Pop, MD [facility] P6/05/2019, 1:40 PM

## 2019-09-21 LAB — PROTIME-INR
INR: 1.9 — ABNORMAL HIGH (ref 0.8–1.2)
Prothrombin Time: 21.2 seconds — ABNORMAL HIGH (ref 11.4–15.2)

## 2019-09-21 LAB — GLUCOSE, CAPILLARY
Glucose-Capillary: 120 mg/dL — ABNORMAL HIGH (ref 70–99)
Glucose-Capillary: 125 mg/dL — ABNORMAL HIGH (ref 70–99)

## 2019-09-21 MED ORDER — AMPICILLIN 500 MG PO CAPS
500.0000 mg | ORAL_CAPSULE | Freq: Four times a day (QID) | ORAL | 0 refills | Status: AC
Start: 2019-09-21 — End: 2019-09-25

## 2019-09-21 NOTE — Progress Notes (Signed)
Physical Therapy Treatment Patient Details Name: Susan Potter MRN: [id number] DOB: 1927/03/30 Today's Date: 09/21/2019    History of Present Illness Pt is a 86 y/o female admitted from SNF secondary to period of unresponsiveness. PMH includes DM, HTN, and a fib. Pt with recent R tibial plateau fx.     PT Comments    Patient awake and agreeable to therapy. Performed general AROM exercises in supine prior to sitting EOB. Completed rt knee ROM AAROM for extension (strengthening) and for flexion (able to achieve ~80 degrees knee flexion with no grimacing, with firm endfeel and held stretch x 30 sec x 3). Worked on lateral scoot along EOB in preparation for sliding board transfer. Pt required cues for sequencing and hesitant to lean forward far enough to unweight pelvis to advance laterally. With repetition, pt improved forward lean and ability to scoot laterally in sitting. Patient with +bowel movement in sitting and returned to supine. Nursing notified. Completed supine strengthening while awaiting nursing care. Patient very pleasant and cooperates with all requests. At this time will need +2 assist for sliding board or lateral scoot to chair vs mechanical lift for OOB.     Follow Up Recommendations  SNF;Supervision/Assistance - 24 hour     Equipment Recommendations  None recommended by PT    Recommendations for Other Services       Precautions / Restrictions Precautions Precautions: Fall Required Braces or Orthoses: Other Brace Other Brace: hinged knee brace; unlocked Restrictions Weight Bearing Restrictions: Yes RLE Weight Bearing: Non weight bearing Other Position/Activity Restrictions: Per daughter, pt can perform knee ROM in R knee now.     Mobility  Bed Mobility Overal bed mobility: Needs Assistance Bed Mobility: Rolling Rolling: Max assist(with rail to left)   Supine to sit: Mod assist;HOB elevated(with rail, HOB elevated, bed pad) Sit to supine: Mod assist(for legs)    General bed mobility comments: pt able to assist to scoot laterally towards EOB in supine, able to advance each leg towards left EOB; with HOB elevated and rail, pt able to raise her torso with PT assisting to pivot on bed pad to sit EOB  Transfers Overall transfer level: Needs assistance(deferred.)   Transfers: Lateral/Scoot Transfers          Lateral/Scoot Transfers: Max assist;From elevated surface General transfer comment: lateral scoot along side of bed towards pillow; max cues for sequencing, pt fearful of leaning forward enough to effectively push with LLE; scoot x 4  Ambulation/Gait             General Gait Details: remains NWB RLE and unable to stand   Stairs             Wheelchair Mobility    Modified Rankin (Stroke Patients Only)       Balance Overall balance assessment: Needs assistance Sitting-balance support: No upper extremity supported;Feet supported Sitting balance-Leahy Scale: Good Sitting balance - Comments: lateral leans/reaching                                    Cognition Arousal/Alertness: Awake/alert Behavior During Therapy: Flat affect Overall Cognitive Status: Impaired/Different from baseline Area of Impairment: Memory;Problem solving                     Memory: Decreased short-term memory;Decreased recall of precautions(could not recall reason for brace or NWB)   Safety/Judgement: Decreased awareness of deficits   Problem Solving: Slow processing;Decreased  initiation;Requires verbal cues;Requires tactile cues General Comments: following commands more briskly; rquires slight incr time (esp for novel task)      Exercises General Exercises - Upper Extremity Shoulder Flexion: AROM;Both;5 reps;Seated Elbow Extension: Strengthening;Both;10 reps;Supine(manually resisted) General Exercises - Lower Extremity Ankle Circles/Pumps: AROM;Both;Supine;10 reps Long Arc Quad: Strengthening;Both;10 reps;Seated(AAROM  RLE) Hip ABduction/ADduction: AAROM;10 reps;Supine;Right Hip Flexion/Marching: Supine;Both;AROM;5 reps Other Exercises Other Exercises: single leg bridge with LLE x 10 reps    General Comments        Pertinent Vitals/Pain Pain Assessment: No/denies pain Faces Pain Scale: No hurt    Home Living                      Prior Function            PT Goals (current goals can now be found in the care plan section) Acute Rehab PT Goals Patient Stated Goal: for pt to return to SNF per daughter Time For Goal Achievement: 10/03/19 Potential to Achieve Goals: Good Progress towards PT goals: Progressing toward goals    Frequency    Min 2X/week      PT Plan Current plan remains appropriate    Co-evaluation              AM-PAC PT "6 Clicks" Mobility   Outcome Measure  Help needed turning from your back to your side while in a flat bed without using bedrails?: A Lot Help needed moving from lying on your back to sitting on the side of a flat bed without using bedrails?: A Lot Help needed moving to and from a bed to a chair (including a wheelchair)?: Total Help needed standing up from a chair using your arms (e.g., wheelchair or bedside chair)?: Total Help needed to walk in hospital room?: Total Help needed climbing 3-5 steps with a railing? : Total 6 Click Score: 8    End of Session   Activity Tolerance: Patient tolerated treatment well Patient left: in bed;with call bell/phone within reach;with bed alarm set Nurse Communication: Weight bearing status;Need for lift equipment;Other (comment)(pt +BM during session, needs pericare) PT Visit Diagnosis: Difficulty in walking, not elsewhere classified (R26.2);Muscle weakness (generalized) (M62.81);Unsteadiness on feet (R26.81);History of falling (Z91.81) Pain - Right/Left: Right Pain - part of body: Knee     Time: 6767-2094 PT Time Calculation (min) (ACUTE ONLY): 37 min  Charges:  $Therapeutic Exercise: 8-22  mins $Therapeutic Activity: 8-22 mins                      Jerolyn Center, PT Pager [phone]    Zena Amos 09/21/2019, 9:43 AM

## 2019-09-21 NOTE — Discharge Instructions (Signed)

## 2019-09-21 NOTE — Progress Notes (Addendum)
Patient has been discharged from unit via [facility]. All IV's were removed. Tele monitor discontinued. AVS documentation given to the daughter. Patient is alert and oriented. Patient denies pain. Patient sent in good health.

## 2019-09-21 NOTE — Discharge Summary (Addendum)
Physician Discharge Summary  Susan Potter JWJ:[id number] DOB: 1926/12/27 DOA: 09/18/2019  PCP: Elayne Snare, MD  Admit date: 09/18/2019 Discharge date: 09/21/2019  Admitted From: Home Disposition:  Home  Discharge Condition:Stable CODE STATUS: DNR Diet recommendation: Heart Healthy   Brief/Interim Summary: Patient is a 86 year old female with history of paroxysmal A. fib, diabetes mellitus, hypertension, recent admission for tibial plateau fracture, E. coli UTI who presented to the emergency part with complaints of altered mental status from skilled nursing facility.  She was found to have Enterococcus faecalis UTI.  Mental status has improved and is at baseline . She is hemodynamically stable to be discharged back to skilled facility today  with oral antibiotics.  Following problems were addressed during her hospitalization:  Acute metabolic encephalopathy: Most likely secondary to Enterococcus faecalis UTI.  Currently she is alert and oriented.  CT head did not show any acute intracranial abnormalities.  Chest x-ray was unremarkable for pneumonia.  There is questionable history of underlying dementia though there is no documented history.  UTI: Urine culture showed Enterococcus faecalis sensitive to ampicillin.    Antibiotics changed to oral today.  No leukocytosis or fever.  Hypokalemia: Supplemented  Paroxysmal A. fib: On Coumadin for anticoagulation.  Currently rate is controlled.  Hypertension: Currently blood pressure stable.  Was hypotensive on presentation.   Hyperlipidemia: Continue statin  Diabetes type 2: On Janumet, glimepiride,lantus  at home.   GERD: Continue PPI  Osteoporosis: Continue calcium, vitamin D supplementation.  Recent tibial plateau fracture: Currently not weightbearing on the right lower extremity.  Follows with orthopedics as an outpatient.  Currently residing is nursing facility for rehabilitation.   Discharge Diagnoses:  Active Problems:    Atrial fibrillation (HCC)   Long term (current) use of anticoagulants   Age-related osteoporosis without current pathological fracture   Type 2 diabetes mellitus without complication, without long-term current use of insulin (HCC)   Mixed diabetic hyperlipidemia associated with type 2 diabetes mellitus ([place])   Essential hypertension   AMS (altered mental status)    Discharge Instructions  Discharge Instructions    Diet - low sodium heart healthy   Complete by: As directed    Discharge instructions   Complete by: As directed    1)Take prescribed medications as instructed.   Increase activity slowly   Complete by: As directed      Allergies as of 09/21/2019   No Known Allergies     Medication List    STOP taking these medications   nystatin cream Commonly known as: MYCOSTATIN     TAKE these medications   acetaminophen 650 MG CR tablet Commonly known as: TYLENOL Take 650 mg by mouth every 6 (six) hours as needed for pain.   ampicillin 500 MG capsule Commonly known as: PRINCIPEN Take 1 capsule (500 mg total) by mouth 4 (four) times daily for 4 days.   atorvastatin 10 MG tablet Commonly known as: LIPITOR Take 1 tablet (10 mg total) by mouth daily. What changed: additional instructions   bisoprolol-hydrochlorothiazide 10-6.25 MG tablet Commonly known as: ZIAC Take 1 tablet by mouth daily. What changed: additional instructions   Dermacloud Crea Apply 1 application topically in the morning, at noon, and at bedtime. Apply to stomach folds every shift for irritation   ergocalciferol 1.25 MG (50000 UT) capsule Commonly known as: VITAMIN D2 Take 1 capsule (50,000 Units total) by mouth See admin instructions. Take 1 capsule by mouth once every 15 days. What changed:   when to take this  additional  instructions   feeding supplement (BOOST BREEZE) Liqd Take 1 Container by mouth 2 (two) times daily. At 0900 and 1800   glimepiride 2 MG tablet Commonly known as:  AMARYL TAKE 1.5 TABLETS BY MOUTH EVERY DAY AT DINNER TIME What changed:   how much to take  how to take this  when to take this  additional instructions   glucose blood test strip One touch Ultra 2 test strips only   indapamide 1.25 MG tablet Commonly known as: LOZOL Take 1 tablet (1.25 mg total) by mouth daily.   insulin glargine 100 unit/mL Sopn Commonly known as: LANTUS Inject 10 Units into the skin at bedtime.   Janumet 50-1000 MG tablet Generic drug: sitaGLIPtin-metformin Take 1 tablet by mouth 2 (two) times daily with a meal. What changed:   when to take this  additional instructions   magnesium oxide 400 MG tablet Commonly known as: MAG-OX Take 400 mg by mouth daily. At 0900   ONE TOUCH ULTRA SYSTEM KIT w/Device Kit One touch Ultra 2 Glucometer   pantoprazole 40 MG tablet Commonly known as: PROTONIX Take 1 tablet (40 mg total) by mouth daily. What changed: additional instructions   raloxifene 60 MG tablet Commonly known as: EVISTA Take 1 tablet (60 mg total) by mouth daily. What changed: additional instructions   traMADol 50 MG tablet Commonly known as: ULTRAM Take 1 tablet (50 mg total) by mouth every 6 (six) hours as needed for moderate pain.   warfarin 2.5 MG tablet Commonly known as: COUMADIN Take as directed. If you are unsure how to take this medication, talk to your nurse or doctor. Original instructions: Take 2.5 mg by mouth at bedtime.   warfarin 5 MG tablet Commonly known as: COUMADIN Take as directed. If you are unsure how to take this medication, talk to your nurse or doctor. Original instructions: Take 1 tablet daily or Take as directed by anticoagulation clinic      Contact information for after-discharge care    Destination    [facility]erred [facility] .   Service: Skilled Nursing Contact information: [place] [phone]             No Known  Allergies  Consultations:  None   Procedures/Studies: CT Head Wo Contrast  Result Date: 09/18/2019 CLINICAL DATA:  Altered mental status EXAM: CT HEAD WITHOUT CONTRAST TECHNIQUE: Contiguous axial images were obtained from the base of the skull through the vertex without intravenous contrast. COMPARISON:  08/29/2019 FINDINGS: Brain: No evidence of acute infarction, hemorrhage, extra-axial collection, ventriculomegaly, or mass effect. Small left basal ganglia lacunar infarct. Generalized cerebral atrophy. Periventricular white matter low attenuation likely secondary to microangiopathy. Vascular: Cerebrovascular atherosclerotic calcifications are noted. Skull: Negative for fracture or focal lesion. Sinuses/Orbits: Visualized portions of the orbits are unremarkable. Visualized portions of the paranasal sinuses are unremarkable. Visualized portions of the mastoid air cells are unremarkable. Other: None. IMPRESSION: 1. No acute intracranial pathology. 2. Chronic microvascular disease and cerebral atrophy. Electronically Signed   By: Kathreen Devoid   On: 09/18/2019 13:57   CT Head Wo Contrast  Result Date: 08/29/2019 CLINICAL DATA:  Fall.  On blood thinner. EXAM: CT HEAD WITHOUT CONTRAST TECHNIQUE: Contiguous axial images were obtained from the base of the skull through the vertex without intravenous contrast. COMPARISON:  None. FINDINGS: Brain: Advanced atrophy. Negative for hydrocephalus. Chronic microvascular ischemic changes are present in the white matter. Small chronic lacunar infarction left head of caudate. Negative  for acute infarct, hemorrhage, mass Vascular: Atherosclerotic calcification. Negative for hyperdense vessel Skull: Negative Sinuses/Orbits: Paranasal sinuses clear. No orbital lesion. Cataract extraction on the left Other: None IMPRESSION: Atrophy and chronic microvascular ischemic change in the white matter. No acute intracranial abnormality. Electronically Signed   By: Franchot Gallo M.D.    On: 08/29/2019 19:08   CT Cervical Spine Wo Contrast  Result Date: 08/29/2019 CLINICAL DATA:  Neck pain.  Fall. EXAM: CT CERVICAL SPINE WITHOUT CONTRAST TECHNIQUE: Multidetector CT imaging of the cervical spine was performed without intravenous contrast. Multiplanar CT image reconstructions were also generated. COMPARISON:  None. FINDINGS: Alignment: No subluxation Skull base and vertebrae: No acute fracture. No primary bone lesion or focal pathologic process. Soft tissues and spinal canal: No prevertebral fluid or swelling. No visible canal hematoma. Disc levels: Disc space narrowing most pronounced at C5-6. Diffuse bilateral degenerative facet disease. Upper chest: No acute findings Other: None IMPRESSION: Degenerative disc and facet disease as above. No acute bony abnormality. Electronically Signed   By: Rolm Baptise M.D.   On: 08/29/2019 19:07   CT Knee Right Wo Contrast  Result Date: 08/29/2019 CLINICAL DATA:  [place]. Tibial plateau fracture. EXAM: CT OF THE right KNEE WITHOUT CONTRAST TECHNIQUE: Multidetector CT imaging of the right knee was performed according to the standard protocol. Multiplanar CT image reconstructions were also generated. COMPARISON:  Right knee radiographs, same date FINDINGS: Tibial plateau fractures are demonstrated. There are fractures on both sides of the tibial spines. The lateral plateau fracture demonstrates minimal depression of 2 mm centrally. The medial tibial plateau fracture begins near the medial spine and runs longitudinally out through the medial metadiaphyseal cortex with slight impaction but no significant displacement. The fibula, femur and patella are intact. Expected hemarthrosis. The PCL is intact. Difficult to evaluate the ACL. The medial and lateral collateral ligament complexes appear grossly intact by CT. The quadriceps and patellar tendons are intact. IMPRESSION: 1. Tibial plateau fractures as described above. 2. Expected hemarthrosis. 3. Difficult to  evaluate the ACL. The PCL and the medial and lateral collateral ligament complexes appear grossly intact by CT. Electronically Signed   By: Marijo Sanes M.D.   On: 08/29/2019 20:02   DG Pelvis Portable  Result Date: 08/29/2019 CLINICAL DATA:  Status post fall from a standing position today. Pelvic pain. Initial encounter. EXAM: PORTABLE PELVIS 1-2 VIEWS COMPARISON:  None. FINDINGS: There is no evidence of pelvic fracture or diastasis. No pelvic bone lesions are seen. IMPRESSION: Negative exam. Electronically Signed   By: Inge Rise M.D.   On: 08/29/2019 18:41   DG Chest Port 1 View  Result Date: 09/18/2019 CLINICAL DATA:  Altered mental status EXAM: PORTABLE CHEST 1 VIEW COMPARISON:  Aug 29, 2019. FINDINGS: Lungs are clear. Heart is upper normal in size with pulmonary vascularity normal. There is aortic atherosclerosis. No adenopathy. Bones are osteoporotic. IMPRESSION: Lungs clear. Heart upper normal in size. Aortic Atherosclerosis (ICD10-I70.0). Electronically Signed   By: Lowella Grip III M.D.   On: 09/18/2019 13:10   DG Chest Portable 1 View  Result Date: 08/29/2019 CLINICAL DATA:  Chest pain after fall. EXAM: PORTABLE CHEST 1 VIEW COMPARISON:  None. FINDINGS: There is no evidence of acute infiltrate, pleural effusion or pneumothorax. The heart size and mediastinal contours are within normal limits. There is moderate severity calcification of the aortic arch. A chronic appearing deformity is seen involving the left humeral head. IMPRESSION: No active disease. Electronically Signed   By: Joyce Gross.D.  On: 08/29/2019 18:42   DG Knee Right Port  Result Date: 08/29/2019 CLINICAL DATA:  Right knee pain due to an injury suffered in a fall position today. Initial encounter. EXAM: PORTABLE RIGHT KNEE - 1-2 VIEW COMPARISON:  None. FINDINGS: The patient has a nondisplaced fracture of the proximal tibia. The fracture extends from just peripheral to the lateral tibial eminence in an  inferior and medial orientation through the proximal diaphysis of the femur on the medial side. There is no depression of the tibial plateaus. No other fracture is identified. There is some osteophytosis about the knee although joint spaces are preserved. Small joint effusion is noted. Atherosclerosis is identified. IMPRESSION: Nondisplaced proximal tibial fracture as described above. Electronically Signed   By: Inge Rise M.D.   On: 08/29/2019 18:46   ECHOCARDIOGRAM COMPLETE  Result Date: 08/30/2019    ECHOCARDIOGRAM REPORT   Patient Name:   Susan Potter Date of Exam: 08/30/2019 Medical Rec #:  [id number]     Height:       67.0 in Accession #:    [id number]    Weight:       220.0 lb Date of Birth:  February 28, 1927     BSA:          2.106 m Patient Age:    75 years      BP:           108/64 mmHg Patient Gender: F             HR:           90 bpm. Exam Location:  Inpatient Procedure: 2D Echo, Cardiac Doppler and Color Doppler Indications:    Syncope  History:        Patient has no prior history of Echocardiogram examinations.                 Arrythmias:Atrial Fibrillation, Signs/Symptoms:Syncope; Risk                 Factors:Hypertension, Diabetes and Dyslipidemia.  Sonographer:    Dustin Flock Referring Phys: [id number] [place]  1. Left ventricular ejection fraction, by estimation, is 65 to 70%. The left ventricle has normal function. The left ventricle has no regional wall motion abnormalities. There is mild concentric left ventricular hypertrophy. Left ventricular diastolic parameters are consistent with Grade III diastolic dysfunction (restrictive). Elevated left atrial pressure.  2. Right ventricular systolic function is normal. The right ventricular size is moderately enlarged. There is mildly elevated pulmonary artery systolic pressure. The estimated right ventricular systolic pressure is 81.3 mmHg.  3. Left atrial size was moderately dilated.  4. Right atrial size was mildly  dilated.  5. The mitral valve is normal in structure. Mild mitral valve regurgitation. No evidence of mitral stenosis.  6. Tricuspid valve regurgitation is moderate.  7. The aortic valve is normal in structure. Aortic valve regurgitation is mild. Mild to moderate aortic valve sclerosis/calcification is present, without any evidence of aortic stenosis.  8. The inferior vena cava is dilated in size with >50% respiratory variability, suggesting right atrial pressure of 8 mmHg. FINDINGS  Left Ventricle: Left ventricular ejection fraction, by estimation, is 65 to 70%. The left ventricle has normal function. The left ventricle has no regional wall motion abnormalities. The left ventricular internal cavity size was normal in size. There is  mild concentric left ventricular hypertrophy. Left ventricular diastolic parameters are consistent with Grade III diastolic dysfunction (restrictive). Elevated left atrial pressure. Right Ventricle: The right ventricular  size is moderately enlarged. No increase in right ventricular wall thickness. Right ventricular systolic function is normal. There is mildly elevated pulmonary artery systolic pressure. The tricuspid regurgitant velocity is 2.97 m/s, and with an assumed right atrial pressure of 8 mmHg, the estimated right ventricular systolic pressure is 35.0 mmHg. Left Atrium: Left atrial size was moderately dilated. Right Atrium: Right atrial size was mildly dilated. Pericardium: There is no evidence of pericardial effusion. Mitral Valve: The mitral valve is normal in structure. There is moderate thickening of the mitral valve leaflet(s). There is moderate calcification of the mitral valve leaflet(s). Normal mobility of the mitral valve leaflets. Mild mitral valve regurgitation. No evidence of mitral valve stenosis. Tricuspid Valve: The tricuspid valve is normal in structure. Tricuspid valve regurgitation is moderate . No evidence of tricuspid stenosis. Aortic Valve: The aortic valve  is normal in structure.. There is moderate thickening and moderate calcification of the aortic valve. Aortic valve regurgitation is mild. Mild to moderate aortic valve sclerosis/calcification is present, without any evidence of aortic stenosis. There is moderate thickening of the aortic valve. There is moderate calcification of the aortic valve. Aortic valve peak gradient measures 12.8 mmHg. Pulmonic Valve: The pulmonic valve was normal in structure. Pulmonic valve regurgitation is not visualized. No evidence of pulmonic stenosis. Aorta: The aortic root is normal in size and structure. Venous: The inferior vena cava is dilated in size with greater than 50% respiratory variability, suggesting right atrial pressure of 8 mmHg. IAS/Shunts: No atrial level shunt detected by color flow Doppler.  LEFT VENTRICLE PLAX 2D LVIDd:         4.90 cm  Diastology LVIDs:         3.40 cm  LV e' lateral:   5.98 cm/s LV PW:         1.10 cm  LV E/e' lateral: 26.9 LV IVS:        1.10 cm  LV e' medial:    8.81 cm/s LVOT diam:     2.00 cm  LV E/e' medial:  18.3 LV SV:         90 LV SV Index:   43 LVOT Area:     3.14 cm  RIGHT VENTRICLE RV Basal diam:  2.60 cm RV S prime:     12.30 cm/s TAPSE (M-mode): 3.2 cm LEFT ATRIUM              Index       RIGHT ATRIUM           Index LA diam:        4.90 cm  2.33 cm/m  RA Area:     14.80 cm LA Vol (A2C):   114.0 ml 54.14 ml/m RA Volume:   38.10 ml  18.09 ml/m LA Vol (A4C):   102.0 ml 48.44 ml/m LA Biplane Vol: 108.0 ml 51.29 ml/m  AORTIC VALVE AV Area (Vmax): 2.44 cm AV Vmax:        179.00 cm/s AV Peak Grad:   12.8 mmHg LVOT Vmax:      139.00 cm/s LVOT Vmean:     85.800 cm/s LVOT VTI:       0.286 m  AORTA Ao Root diam: 3.10 cm MITRAL VALVE                TRICUSPID VALVE MV Area (PHT): 3.85 cm     TR Peak grad:   35.3 mmHg MV Decel Time: 197 msec     TR Vmax:  297.00 cm/s MV E velocity: 161.00 cm/s MV A velocity: 42.60 cm/s   SHUNTS MV E/A ratio:  3.78         Systemic VTI:  0.29 m                              Systemic Diam: 2.00 cm Ena Dawley MD Electronically signed by Ena Dawley MD Signature Date/Time: 08/30/2019/3:54:41 PM    Final        Subjective: Patient seen and examined at the bedside this morning.  Hemodynamically stable for discharge today.  Discharge Exam: Vitals:   09/21/19 0404 09/21/19 0850  BP: 120/65   Pulse: 63   Resp: 18   Temp: 98.3 F (36.8 C) 98.6 F (37 C)  SpO2: 96%    Vitals:   09/20/19 1921 09/20/19 2321 09/21/19 0404 09/21/19 0850  BP: 138/65 136/63 120/65   Pulse: 76 70 63   Resp: _0 Temp: 97.6 F (36.4 C) 98 F (36.7 C) 98.3 F (36.8 C) 98.6 F (37 C)  TempSrc: Oral Oral Oral Oral  SpO2: 94% 94% 96%   Weight:      Height:        General: Pt is alert, awake, not in acute distress, generalized weakness Cardiovascular: RRR, S1/S2 +, no rubs, no gallops Respiratory: CTA bilaterally, no wheezing, no rhonchi Abdominal: Soft, NT, ND, bowel sounds + Extremities: no edema, no cyanosis    The results of significant diagnostics from this hospitalization (including imaging, microbiology, ancillary and laboratory) are listed below for reference.     Microbiology: Recent Results (from the past 240 hour(s))  SARS Coronavirus 2 by RT PCR (hospital order, performed in [facility] hospital lab) Nasopharyngeal Nasopharyngeal Swab     Status: None   Collection Time: 09/18/19  3:45 PM   Specimen: Nasopharyngeal Swab  Result Value Ref Range Status   SARS Coronavirus 2 NEGATIVE NEGATIVE Final    Comment: (NOTE) SARS-CoV-2 target nucleic acids are NOT DETECTED. The SARS-CoV-2 RNA is generally detectable in upper and lower respiratory specimens during the acute phase of infection. The lowest concentration of SARS-CoV-2 viral copies this assay can detect is 250 copies / mL. A negative result does not preclude SARS-CoV-2 infection and should not be used as the sole basis for treatment or other patient management  decisions.  A negative result may occur with improper specimen collection / handling, submission of specimen other than nasopharyngeal swab, presence of viral mutation(s) within the areas targeted by this assay, and inadequate number of viral copies (<250 copies / mL). A negative result must be combined with clinical observations, patient history, and epidemiological information. Fact Sheet for Patients:   StrictlyIdeas.no Fact Sheet for Healthcare Providers: BankingDealers.co.za This test is not yet approved or cleared  by the [place] FDA and has been authorized for detection and/or diagnosis of SARS-CoV-2 by FDA under an Emergency Use Authorization (EUA).  This EUA will remain in effect (meaning this test can be used) for the duration of the COVID-19 declaration under Section 564(b)(1) of the Act, 21 U.S.C. section 360bbb-3(b)(1), unless the authorization is terminated or revoked sooner. Performed at [facility], [place] [address], [place], [place] [address]   Urine culture     Status: Abnormal   Collection Time: 09/18/19  3:47 PM   Specimen: Urine, Catheterized  Result Value Ref Range Status   Specimen Description URINE, CATHETERIZED  Final  Special Requests   Final    NONE Performed at [facility], [place] [address] [place], [place] [address]    Culture >=100,000 COLONIES/mL ENTEROCOCCUS FAECALIS (A)  Final   Report Status 09/20/2019 FINAL  Final   Organism ID, Bacteria ENTEROCOCCUS FAECALIS (A)  Final      Susceptibility   Enterococcus faecalis - MIC*    AMPICILLIN <=2 SENSITIVE Sensitive     NITROFURANTOIN <=16 SENSITIVE Sensitive     VANCOMYCIN 1 SENSITIVE Sensitive     * >=100,000 COLONIES/mL ENTEROCOCCUS FAECALIS  MRSA PCR Screening     Status: None   Collection Time: 09/19/19  6:57 AM   Specimen: Nasopharyngeal  Result Value Ref Range Status   MRSA by PCR NEGATIVE NEGATIVE Final    Comment:        The  GeneXpert MRSA Assay (FDA approved for NASAL specimens only), is one component of a comprehensive MRSA colonization surveillance program. It is not intended to diagnose MRSA infection nor to guide or monitor treatment for MRSA infections. Performed at [facility], [place] [address], [place],  [address]      Labs: BNP (last 3 results) No results for input(s): BNP in the last 8760 hours. Basic Metabolic Panel: Recent Labs  Lab 09/18/19 1221 09/19/19 0342 09/19/19 1333 09/20/19 0644  NA 139 140  --  140  K 3.9 3.3*  --  4.1  CL 105 105  --  104  CO2 24 26  --  26  GLUCOSE 123* 65*  --  157*  BUN 16 14  --  13  CREATININE 1.24* 1.00  --  1.11*  CALCIUM 8.7* 8.5*  --  8.7*  MG  --   --  1.6* 2.0   Liver Function Tests: Recent Labs  Lab 09/18/19 1221 09/19/19 0342  AST 18 14*  ALT 10 10  ALKPHOS 78 80  BILITOT 0.9 1.2  PROT 6.5 5.9*  ALBUMIN 2.6* 2.5*   No results for input(s): LIPASE, AMYLASE in the last 168 hours. No results for input(s): AMMONIA in the last 168 hours. CBC: Recent Labs  Lab 09/18/19 1221 09/19/19 0342 09/20/19 0644  WBC 10.2 8.6 9.3  HGB 9.9* 9.3* 9.8*  HCT 32.0* 29.5* 31.6*  MCV 95.0 93.9 94.3  PLT 428* 407* 381   Cardiac Enzymes: No results for input(s): CKTOTAL, CKMB, CKMBINDEX, TROPONINI in the last 168 hours. BNP: Invalid input(s): POCBNP CBG: Recent Labs  Lab 09/20/19 0626 09/20/19 1155 09/20/19 1648 09/20/19 2127 09/21/19 0635  GLUCAP 171* 165* 116* 153* 120*   D-Dimer No results for input(s): DDIMER in the last 72 hours. Hgb A1c No results for input(s): HGBA1C in the last 72 hours. Lipid Profile No results for input(s): CHOL, HDL, LDLCALC, TRIG, CHOLHDL, LDLDIRECT in the last 72 hours. Thyroid function studies No results for input(s): TSH, T4TOTAL, T3FREE, THYROIDAB in the last 72 hours.  Invalid input(s): FREET3 Anemia work up No results for input(s): VITAMINB12, FOLATE, FERRITIN, TIBC, IRON,  RETICCTPCT in the last 72 hours. Urinalysis    Component Value Date/Time   COLORURINE YELLOW 09/18/2019 1544   APPEARANCEUR HAZY (A) 09/18/2019 1544   LABSPEC 1.011 09/18/2019 1544   PHURINE 5.0 09/18/2019 1544   GLUCOSEU NEGATIVE 09/18/2019 1544   GLUCOSEU NEGATIVE 09/12/2014 0815   HGBUR NEGATIVE 09/18/2019 1544   BILIRUBINUR NEGATIVE 09/18/2019 1544   KETONESUR NEGATIVE 09/18/2019 1544   PROTEINUR NEGATIVE 09/18/2019 1544   UROBILINOGEN 0.2 09/12/2014 0815   NITRITE NEGATIVE  09/18/2019 1544   LEUKOCYTESUR SMALL (A) 09/18/2019 1544   Sepsis Labs Invalid input(s): PROCALCITONIN,  WBC,  LACTICIDVEN Microbiology Recent Results (from the past 240 hour(s))  SARS Coronavirus 2 by RT PCR (hospital order, performed in [facility] hospital lab) Nasopharyngeal Nasopharyngeal Swab     Status: None   Collection Time: 09/18/19  3:45 PM   Specimen: Nasopharyngeal Swab  Result Value Ref Range Status   SARS Coronavirus 2 NEGATIVE NEGATIVE Final    Comment: (NOTE) SARS-CoV-2 target nucleic acids are NOT DETECTED. The SARS-CoV-2 RNA is generally detectable in upper and lower respiratory specimens during the acute phase of infection. The lowest concentration of SARS-CoV-2 viral copies this assay can detect is 250 copies / mL. A negative result does not preclude SARS-CoV-2 infection and should not be used as the sole basis for treatment or other patient management decisions.  A negative result may occur with improper specimen collection / handling, submission of specimen other than nasopharyngeal swab, presence of viral mutation(s) within the areas targeted by this assay, and inadequate number of viral copies (<250 copies / mL). A negative result must be combined with clinical observations, patient history, and epidemiological information. Fact Sheet for Patients:   StrictlyIdeas.no Fact Sheet for Healthcare Providers: BankingDealers.co.za This  test is not yet approved or cleared  by the [place] FDA and has been authorized for detection and/or diagnosis of SARS-CoV-2 by FDA under an Emergency Use Authorization (EUA).  This EUA will remain in effect (meaning this test can be used) for the duration of the COVID-19 declaration under Section 564(b)(1) of the Act, 21 U.S.C. section 360bbb-3(b)(1), unless the authorization is terminated or revoked sooner. Performed at [facility], [place] [address] [place], [place] [address]   Urine culture     Status: Abnormal   Collection Time: 09/18/19  3:47 PM   Specimen: Urine, Catheterized  Result Value Ref Range Status   Specimen Description URINE, CATHETERIZED  Final   Special Requests   Final    NONE Performed at [facility], [address] [place], [place] [address]    Culture >=100,000 COLONIES/mL ENTEROCOCCUS FAECALIS (A)  Final   Report Status 09/20/2019 FINAL  Final   Organism ID, Bacteria ENTEROCOCCUS FAECALIS (A)  Final      Susceptibility   Enterococcus faecalis - MIC*    AMPICILLIN <=2 SENSITIVE Sensitive     NITROFURANTOIN <=16 SENSITIVE Sensitive     VANCOMYCIN 1 SENSITIVE Sensitive     * >=100,000 COLONIES/mL ENTEROCOCCUS FAECALIS  MRSA PCR Screening     Status: None   Collection Time: 09/19/19  6:57 AM   Specimen: Nasopharyngeal  Result Value Ref Range Status   MRSA by PCR NEGATIVE NEGATIVE Final    Comment:        The GeneXpert MRSA Assay (FDA approved for NASAL specimens only), is one component of a comprehensive MRSA colonization surveillance program. It is not intended to diagnose MRSA infection nor to guide or monitor treatment for MRSA infections. Performed at [facility], [place] [address] [place], [place] [address]     Please note: You were cared for by a hospitalist during your hospital stay. Once you are discharged, your primary care physician will handle any further medical issues. Please note that NO REFILLS for any  discharge medications will be authorized once you are discharged, as it is imperative that you return to your primary care physician (or establish a relationship with a primary care physician  if you do not have one) for your post hospital discharge needs so that they can reassess your need for medications and monitor your lab values.    Time coordinating discharge: 40 minutes  SIGNED:   Shelly Coss, MD  [facility] 09/21/2019, 10:55 AM Pager [phone]  If 7PM-7AM, please contact night-coverage [email or web address] Password [email or web address]

## 2019-09-21 NOTE — TOC Transition Note (Signed)
Transition of Care [facility]) - CM/SW Discharge Note   Patient Details  Name: Susan Potter MRN: [id number] Date of Birth: 1926/06/18  Transition of Care [facility]) CM/SW Contact:  Baldemar Lenis, LCSW Phone Number: 09/21/2019, 2:24 PM   Clinical Narrative:   Nurse to call report to [phone]    Final next level of care: Skilled Nursing Facility Barriers to Discharge: Barriers Resolved   Patient Goals and CMS Choice Patient states their goals for this hospitalization and ongoing recovery are:: patient unable to participate in goal setting due to disorientation CMS Medicare.gov Compare Post Acute Care list provided to:: Patient Represenative (must comment) Choice offered to / list presented to : Adult Children  Discharge Placement              Patient chooses bed at: [facility] [place] Patient to be transferred to facility by: [facility] Name of family member notified: Daughter Patient and family notified of of transfer: 09/21/19  Discharge Plan and Services     Post Acute Care Choice: Skilled Nursing Facility                               Social Determinants of Health (SDOH) Interventions     Readmission Risk Interventions No flowsheet data found.

## 2019-10-02 ENCOUNTER — Telehealth: Payer: Self-pay | Admitting: Endocrinology

## 2019-10-02 NOTE — Telephone Encounter (Signed)
-----   Message from Cherylann Parr sent at 09/22/2019  8:18 AM EDT ----- Regarding: RE: Appointment ATC - unable to leave voicemail. Sent mychart. ----- Message ----- From: [name], MD Sent: 09/21/2019  11:24 AM EDT To: Cherylann Parr Subject: Appointment                                    Needs follow-up appointment post discharge from hospital.  Same day labs okay

## 2019-10-02 NOTE — Telephone Encounter (Signed)
ATC-unable to leave message again

## 2019-10-12 ENCOUNTER — Telehealth: Payer: Self-pay | Admitting: General Practice

## 2019-10-12 NOTE — Telephone Encounter (Signed)
Daughter, Sedalia Muta says that pt is in rehab at [facility] nursing home and will be there approximately another 3 weeks.  Diane will call Bailey Mech, RN to set up INR appointment when patient is discharged.

## 2019-10-31 ENCOUNTER — Emergency Department (HOSPITAL_COMMUNITY): Payer: Medicare Other

## 2019-10-31 ENCOUNTER — Inpatient Hospital Stay (HOSPITAL_COMMUNITY): Payer: Medicare Other

## 2019-10-31 ENCOUNTER — Other Ambulatory Visit: Payer: Self-pay

## 2019-10-31 ENCOUNTER — Encounter (HOSPITAL_COMMUNITY): Payer: Self-pay | Admitting: Emergency Medicine

## 2019-10-31 ENCOUNTER — Inpatient Hospital Stay (HOSPITAL_COMMUNITY)
Admission: EM | Admit: 2019-10-31 | Discharge: 2019-11-03 | DRG: 064 | Disposition: A | Discharge status: Skilled Nursing Facility | Payer: Medicare Other | Source: Skilled Nursing Facility | Attending: Internal Medicine | Admitting: Internal Medicine

## 2019-10-31 DIAGNOSIS — Z20822 Contact with and (suspected) exposure to covid-19: Secondary | ICD-10-CM | POA: Diagnosis present

## 2019-10-31 DIAGNOSIS — I351 Nonrheumatic aortic (valve) insufficiency: Secondary | ICD-10-CM | POA: Diagnosis not present

## 2019-10-31 DIAGNOSIS — I482 Chronic atrial fibrillation, unspecified: Secondary | ICD-10-CM | POA: Diagnosis not present

## 2019-10-31 DIAGNOSIS — E669 Obesity, unspecified: Secondary | ICD-10-CM | POA: Diagnosis present

## 2019-10-31 DIAGNOSIS — Z8249 Family history of ischemic heart disease and other diseases of the circulatory system: Secondary | ICD-10-CM | POA: Diagnosis not present

## 2019-10-31 DIAGNOSIS — R29810 Facial weakness: Secondary | ICD-10-CM | POA: Diagnosis present

## 2019-10-31 DIAGNOSIS — Z79899 Other long term (current) drug therapy: Secondary | ICD-10-CM

## 2019-10-31 DIAGNOSIS — R471 Dysarthria and anarthria: Secondary | ICD-10-CM | POA: Diagnosis present

## 2019-10-31 DIAGNOSIS — I639 Cerebral infarction, unspecified: Principal | ICD-10-CM | POA: Diagnosis present

## 2019-10-31 DIAGNOSIS — G92 Toxic encephalopathy: Secondary | ICD-10-CM | POA: Diagnosis present

## 2019-10-31 DIAGNOSIS — Z7901 Long term (current) use of anticoagulants: Secondary | ICD-10-CM | POA: Diagnosis not present

## 2019-10-31 DIAGNOSIS — Z6832 Body mass index (BMI) 32.0-32.9, adult: Secondary | ICD-10-CM | POA: Diagnosis not present

## 2019-10-31 DIAGNOSIS — I10 Essential (primary) hypertension: Secondary | ICD-10-CM | POA: Diagnosis present

## 2019-10-31 DIAGNOSIS — E1169 Type 2 diabetes mellitus with other specified complication: Secondary | ICD-10-CM | POA: Diagnosis present

## 2019-10-31 DIAGNOSIS — Z66 Do not resuscitate: Secondary | ICD-10-CM | POA: Diagnosis present

## 2019-10-31 DIAGNOSIS — I69354 Hemiplegia and hemiparesis following cerebral infarction affecting left non-dominant side: Secondary | ICD-10-CM | POA: Diagnosis not present

## 2019-10-31 DIAGNOSIS — R29709 NIHSS score 9: Secondary | ICD-10-CM | POA: Diagnosis present

## 2019-10-31 DIAGNOSIS — I480 Paroxysmal atrial fibrillation: Secondary | ICD-10-CM | POA: Diagnosis present

## 2019-10-31 DIAGNOSIS — M810 Age-related osteoporosis without current pathological fracture: Secondary | ICD-10-CM | POA: Diagnosis present

## 2019-10-31 DIAGNOSIS — I6521 Occlusion and stenosis of right carotid artery: Secondary | ICD-10-CM | POA: Diagnosis not present

## 2019-10-31 DIAGNOSIS — I63131 Cerebral infarction due to embolism of right carotid artery: Secondary | ICD-10-CM | POA: Diagnosis not present

## 2019-10-31 DIAGNOSIS — E785 Hyperlipidemia, unspecified: Secondary | ICD-10-CM | POA: Diagnosis present

## 2019-10-31 DIAGNOSIS — N390 Urinary tract infection, site not specified: Secondary | ICD-10-CM | POA: Diagnosis present

## 2019-10-31 DIAGNOSIS — Z794 Long term (current) use of insulin: Secondary | ICD-10-CM

## 2019-10-31 DIAGNOSIS — E11649 Type 2 diabetes mellitus with hypoglycemia without coma: Secondary | ICD-10-CM | POA: Diagnosis not present

## 2019-10-31 DIAGNOSIS — E559 Vitamin D deficiency, unspecified: Secondary | ICD-10-CM | POA: Diagnosis present

## 2019-10-31 LAB — CBG MONITORING, ED
Glucose-Capillary: 112 mg/dL — ABNORMAL HIGH (ref 70–99)
Glucose-Capillary: 89 mg/dL (ref 70–99)

## 2019-10-31 LAB — RAPID URINE DRUG SCREEN, HOSP PERFORMED
Amphetamines: NOT DETECTED
Barbiturates: NOT DETECTED
Benzodiazepines: NOT DETECTED
Cocaine: NOT DETECTED
Opiates: NOT DETECTED
Tetrahydrocannabinol: NOT DETECTED

## 2019-10-31 LAB — COMPREHENSIVE METABOLIC PANEL
ALT: 11 U/L (ref 0–44)
AST: 14 U/L — ABNORMAL LOW (ref 15–41)
Albumin: 2.7 g/dL — ABNORMAL LOW (ref 3.5–5.0)
Alkaline Phosphatase: 58 U/L (ref 38–126)
Anion gap: 12 (ref 5–15)
BUN: 14 mg/dL (ref 8–23)
CO2: 27 mmol/L (ref 22–32)
Calcium: 9.1 mg/dL (ref 8.9–10.3)
Chloride: 99 mmol/L (ref 98–111)
Creatinine, Ser: 1.18 mg/dL — ABNORMAL HIGH (ref 0.44–1.00)
GFR calc Af Amer: 47 mL/min — ABNORMAL LOW (ref >60)
GFR calc non Af Amer: 40 mL/min — ABNORMAL LOW (ref >60)
Glucose, Bld: 119 mg/dL — ABNORMAL HIGH (ref 70–99)
Potassium: 4.4 mmol/L (ref 3.5–5.1)
Sodium: 138 mmol/L (ref 135–145)
Total Bilirubin: 0.6 mg/dL (ref 0.3–1.2)
Total Protein: 6.8 g/dL (ref 6.5–8.1)

## 2019-10-31 LAB — URINALYSIS, ROUTINE W REFLEX MICROSCOPIC
Bilirubin Urine: NEGATIVE
Glucose, UA: NEGATIVE mg/dL
Ketones, ur: NEGATIVE mg/dL
Nitrite: NEGATIVE
Protein, ur: 30 mg/dL — AB
Specific Gravity, Urine: 1.01 (ref 1.005–1.030)
WBC, UA: >50 WBC/hpf — ABNORMAL HIGH (ref 0–5)
pH: 8 (ref 5.0–8.0)

## 2019-10-31 LAB — I-STAT CHEM 8, ED
BUN: 17 mg/dL (ref 8–23)
Calcium, Ion: 1.1 mmol/L — ABNORMAL LOW (ref 1.15–1.40)
Chloride: 98 mmol/L (ref 98–111)
Creatinine, Ser: 1.2 mg/dL — ABNORMAL HIGH (ref 0.44–1.00)
Glucose, Bld: 113 mg/dL — ABNORMAL HIGH (ref 70–99)
HCT: 35 % — ABNORMAL LOW (ref 36.0–46.0)
Hemoglobin: 11.9 g/dL — ABNORMAL LOW (ref 12.0–15.0)
Potassium: 4.3 mmol/L (ref 3.5–5.1)
Sodium: 138 mmol/L (ref 135–145)
TCO2: 27 mmol/L (ref 22–32)

## 2019-10-31 LAB — CBC
HCT: 35.8 % — ABNORMAL LOW (ref 36.0–46.0)
Hemoglobin: 11.4 g/dL — ABNORMAL LOW (ref 12.0–15.0)
MCH: 29.4 pg (ref 26.0–34.0)
MCHC: 31.8 g/dL (ref 30.0–36.0)
MCV: 92.3 fL (ref 80.0–100.0)
Platelets: 271 10*3/uL (ref 150–400)
RBC: 3.88 MIL/uL (ref 3.87–5.11)
RDW: 15.5 % (ref 11.5–15.5)
WBC: 11.9 10*3/uL — ABNORMAL HIGH (ref 4.0–10.5)
nRBC: 0 % (ref 0.0–0.2)

## 2019-10-31 LAB — DIFFERENTIAL
Abs Immature Granulocytes: 0.05 10*3/uL (ref 0.00–0.07)
Basophils Absolute: 0.1 10*3/uL (ref 0.0–0.1)
Basophils Relative: 1 %
Eosinophils Absolute: 0.2 10*3/uL (ref 0.0–0.5)
Eosinophils Relative: 2 %
Immature Granulocytes: 0 %
Lymphocytes Relative: 12 %
Lymphs Abs: 1.4 10*3/uL (ref 0.7–4.0)
Monocytes Absolute: 1 10*3/uL (ref 0.1–1.0)
Monocytes Relative: 8 %
Neutro Abs: 9.1 10*3/uL — ABNORMAL HIGH (ref 1.7–7.7)
Neutrophils Relative %: 77 %

## 2019-10-31 LAB — PROTIME-INR
INR: 1.3 — ABNORMAL HIGH (ref 0.8–1.2)
Prothrombin Time: 15.7 seconds — ABNORMAL HIGH (ref 11.4–15.2)

## 2019-10-31 LAB — APTT: aPTT: 26 seconds (ref 24–36)

## 2019-10-31 LAB — ETHANOL: Alcohol, Ethyl (B): <10 mg/dL (ref <10)

## 2019-10-31 LAB — HEMOGLOBIN A1C
Hgb A1c MFr Bld: 6.1 % — ABNORMAL HIGH (ref 4.8–5.6)
Mean Plasma Glucose: 128.37 mg/dL

## 2019-10-31 LAB — SARS CORONAVIRUS 2 BY RT PCR (HOSPITAL ORDER, PERFORMED IN ~~LOC~~ HOSPITAL LAB): SARS Coronavirus 2: NEGATIVE

## 2019-10-31 MED ORDER — ATORVASTATIN CALCIUM 10 MG PO TABS
10.0000 mg | ORAL_TABLET | Freq: Every day | ORAL | Status: DC
  Administered 2019-11-02: 10 mg via ORAL
  Filled 2019-10-31: qty 1

## 2019-10-31 MED ORDER — SODIUM CHLORIDE 0.9 % IV SOLN: INTRAVENOUS | Status: DC

## 2019-10-31 MED ORDER — ACETAMINOPHEN 325 MG PO TABS: 650.0000 mg | ORAL_TABLET | ORAL | Status: DC | PRN

## 2019-10-31 MED ORDER — STROKE: EARLY STAGES OF RECOVERY BOOK
Freq: Once | Status: AC
  Filled 2019-10-31: qty 1

## 2019-10-31 MED ORDER — INSULIN GLARGINE 100 UNITS/ML SOLOSTAR PEN
10.0000 [IU] | PEN_INJECTOR | Freq: Every day | SUBCUTANEOUS | Status: DC
  Filled 2019-10-31: qty 3

## 2019-10-31 MED ORDER — WARFARIN - PHARMACIST DOSING INPATIENT: Freq: Every day | Status: DC

## 2019-10-31 MED ORDER — INSULIN GLARGINE 100 UNIT/ML ~~LOC~~ SOLN
10.0000 [IU] | Freq: Every day | SUBCUTANEOUS | Status: DC
  Administered 2019-10-31: 10 [IU] via SUBCUTANEOUS
  Filled 2019-10-31 (×2): qty 0.1

## 2019-10-31 MED ORDER — RALOXIFENE HCL 60 MG PO TABS
60.0000 mg | ORAL_TABLET | Freq: Every day | ORAL | Status: DC
  Administered 2019-11-02 – 2019-11-03 (×2): 60 mg via ORAL
  Filled 2019-10-31 (×3): qty 1

## 2019-10-31 MED ORDER — INSULIN ASPART 100 UNIT/ML ~~LOC~~ SOLN
0.0000 [IU] | Freq: Three times a day (TID) | SUBCUTANEOUS | Status: DC
  Administered 2019-11-02 – 2019-11-03 (×3): 2 [IU] via SUBCUTANEOUS
  Administered 2019-11-03: 1 [IU] via SUBCUTANEOUS

## 2019-10-31 MED ORDER — ACETAMINOPHEN 650 MG RE SUPP: 650.0000 mg | RECTAL | Status: DC | PRN

## 2019-10-31 MED ORDER — HEPARIN (PORCINE) 25000 UT/250ML-% IV SOLN
950.0000 [IU]/h | INTRAVENOUS | Status: DC
  Administered 2019-10-31: 1100 [IU]/h via INTRAVENOUS
  Administered 2019-11-01: 1200 [IU]/h via INTRAVENOUS
  Filled 2019-10-31 (×2): qty 250

## 2019-10-31 MED ORDER — SENNOSIDES-DOCUSATE SODIUM 8.6-50 MG PO TABS: 1.0000 | ORAL_TABLET | Freq: Every evening | ORAL | Status: DC | PRN

## 2019-10-31 MED ORDER — WARFARIN SODIUM 4 MG PO TABS
4.0000 mg | ORAL_TABLET | Freq: Once | ORAL | Status: DC
  Filled 2019-10-31: qty 1

## 2019-10-31 MED ORDER — ACETAMINOPHEN 160 MG/5ML PO SOLN: 650.0000 mg | ORAL | Status: DC | PRN

## 2019-10-31 NOTE — Code Documentation (Signed)
86 yo female coming from [place] where she was last known normal at 0900. Pt was given meds by RN at this time and was noted to be at her baseline. At 1220, Pt came to assess patient and noted that she had "worsening" slurred speech. EMS was called and activated a Code Stroke.   Stroke Team met patient upon arrival. Labs drawn and airway cleared by MD Sabra Heck. Taken to CT. CT Head completed. Initial NIHSS 9 due to slurred speech, bilateral leg weakness, and some left sided neglect that may be attributed to effort.   Patient returned to room. Placed on the cardiac monitor. Outside window for tPA. Not a candidate for IR per MD. Patient to have q2h mNIHSS/VS and MRI.

## 2019-10-31 NOTE — ED Triage Notes (Signed)
Pt presents from [facility] for slurred speech noted at 12p by PT at facility. LSW 0900a at med pass.   Broken R tibia 10wks ago. Daughter states that pt has become withdrawn, lethargic, confusion increasingly over that time. 10wks ago living by self.

## 2019-10-31 NOTE — ED Notes (Addendum)
Pts daughter denise is outside waiting for any updates. Phone number [phone]

## 2019-10-31 NOTE — ED Provider Notes (Signed)
[facility] EMERGENCY DEPARTMENT Provider Note   CSN: [id number] Arrival date & time: 10/31/19  1332     History No chief complaint on file.   Susan Potter is a 86 y.o. female.  HPI   This is a 86 year old female, history of paroxysmal atrial fibrillation as well as diabetes, she is currently on Coumadin.  The history is obtained from the paramedics as the patient had some difficulty with slurred speech and has a level 5 caveat.  She presents with a complaint of some slurred speech which occurred sometime after 9:00 AM today when she was last seen normal.  When the paramedics arrived they noted some difficulty with speaking but otherwise seem to be at her baseline.  I was present at the patient's arrival and saw the patient at approximately 1:33 PM.  Past Medical History:  Diagnosis Date  . AF (paroxysmal atrial fibrillation) ([place]) 08/29/2019  . Atrial fibrillation ([place])   . Diabetes mellitus without complication ([place])   . Hypertension   . Mixed diabetic hyperlipidemia associated with type 2 diabetes mellitus ([place]) 08/29/2019  . Osteoporosis   . Retinal vein occlusion   . Type 2 diabetes mellitus without complication, without long-term current use of insulin ([place]) 08/29/2019  . Vitamin D deficiency     Patient Active Problem List   Diagnosis Date Noted  . AMS (altered mental status) 09/18/2019  . E. coli UTI   . Hypomagnesemia 08/30/2019  . Acute lower UTI 08/30/2019  . Vitamin D deficiency 08/30/2019  . AF (paroxysmal atrial fibrillation) ([place]) 08/29/2019  . Type 2 diabetes mellitus without complication, without long-term current use of insulin ([place]) 08/29/2019  . Mixed diabetic hyperlipidemia associated with type 2 diabetes mellitus ([place]) 08/29/2019  . Essential hypertension 08/29/2019  . Fall at home, initial encounter 08/29/2019  . Tibial plateau fracture, right, closed, initial encounter 08/29/2019  . Fracture of right tibial plateau, closed,  initial encounter 08/29/2019  . Age-related osteoporosis without current pathological fracture 10/15/2017  . Encounter for therapeutic drug monitoring 05/12/2013  . Long term (current) use of anticoagulants 02/08/2013  . Type II or unspecified type diabetes mellitus without mention of complication, uncontrolled 11/08/2012  . Unspecified essential hypertension 11/08/2012  . Pure hypercholesterolemia 11/08/2012  . Atrial fibrillation ([place]) 11/08/2012  . Osteoporosis, unspecified 11/08/2012    No past surgical history on file.   OB History   No obstetric history on file.     Family History  Problem Relation Age of Onset  . Healthy Mother   . Early death Father   . Heart disease Brother     Social History   Tobacco Use  . Smoking status: Never Smoker  . Smokeless tobacco: Never Used  Substance Use Topics  . Alcohol use: Never  . Drug use: Never    Home Medications Prior to Admission medications   Medication Sig Start Date End Date Taking? Authorizing Provider  acetaminophen (TYLENOL) 650 MG CR tablet Take 650 mg by mouth every 6 (six) hours as needed for pain.     [provider]  atorvastatin (LIPITOR) 10 MG tablet Take 1 tablet (10 mg total) by mouth daily. Patient taking differently: Take 10 mg by mouth daily. At 2100 03/14/19   Elayne Snare, MD  bisoprolol-hydrochlorothiazide [facility]) 10-6.25 MG tablet Take 1 tablet by mouth daily. Patient taking differently: Take 1 tablet by mouth daily. At 0900 03/14/19   Elayne Snare, MD  Blood Glucose Monitoring Suppl (ONE TOUCH ULTRA SYSTEM KIT) W/DEVICE  KIT One touch Ultra 2 Glucometer 11/11/12   Elayne Snare, MD  ergocalciferol (VITAMIN D2) 1.25 MG (50000 UT) capsule Take 1 capsule (50,000 Units total) by mouth See admin instructions. Take 1 capsule by mouth once every 15 days. Patient taking differently: Take 50,000 Units by mouth every 14 (fourteen) days.  06/29/19   Elayne Snare, MD  glimepiride (AMARYL) 2 MG tablet TAKE 1.5  TABLETS BY MOUTH EVERY DAY AT [facility] TIME Patient taking differently: Take 3 mg by mouth every evening. At 1700 04/13/19   Elayne Snare, MD  glucose blood test strip One touch Ultra 2 test strips only 12/16/15   Elayne Snare, MD  indapamide (LOZOL) 1.25 MG tablet Take 1 tablet (1.25 mg total) by mouth daily. Patient not taking: Reported on 09/18/2019 06/21/19   Elayne Snare, MD  Infant Care Products [facility]) CREA Apply 1 application topically in the morning, at noon, and at bedtime. Apply to stomach folds every shift for irritation    [provider]  insulin glargine (LANTUS) 100 unit/mL SOPN Inject 10 Units into the skin at bedtime.    [provider]  magnesium oxide (MAG-OX) 400 MG tablet Take 400 mg by mouth daily. At 0900    [provider]  Nutritional Supplements (FEEDING SUPPLEMENT, BOOST BREEZE,) LIQD Take 1 Container by mouth 2 (two) times daily. At 0900 and 1800    [provider]  pantoprazole (PROTONIX) 40 MG tablet Take 1 tablet (40 mg total) by mouth daily. Patient taking differently: Take 40 mg by mouth daily. At 0630 09/04/19   Eugenie Filler, MD  raloxifene (EVISTA) 60 MG tablet Take 1 tablet (60 mg total) by mouth daily. Patient taking differently: Take 60 mg by mouth daily. At 0900 06/29/19   Elayne Snare, MD  sitaGLIPtin-metformin (JANUMET) 50-1000 MG tablet Take 1 tablet by mouth 2 (two) times daily with a meal. Patient taking differently: Take 1 tablet by mouth in the morning and at bedtime. At 0900 and 2100 03/14/19   Elayne Snare, MD  traMADol (ULTRAM) 50 MG tablet Take 1 tablet (50 mg total) by mouth every 6 (six) hours as needed for moderate pain. Patient not taking: Reported on 09/18/2019 09/04/19   Eugenie Filler, MD  warfarin (COUMADIN) 2.5 MG tablet Take 2.5 mg by mouth at bedtime.    [provider]  warfarin (COUMADIN) 5 MG tablet Take 1 tablet daily or Take as directed by anticoagulation clinic 04/19/19   Elayne Snare, MD     Allergies    Patient has no known allergies.  Review of Systems   Review of Systems  Unable to perform ROS: Acuity of condition    Physical Exam Updated Vital Signs There were no vitals taken for this visit.  Physical Exam Vitals and nursing note reviewed.  Constitutional:      General: She is not in acute distress.    Appearance: She is well-developed.  HENT:     Head: Normocephalic and atraumatic.     Mouth/Throat:     Pharynx: No oropharyngeal exudate.  Eyes:     General: No scleral icterus.       Right eye: No discharge.        Left eye: No discharge.     Conjunctiva/sclera: Conjunctivae normal.     Pupils: Pupils are equal, round, and reactive to light.  Neck:     Thyroid: No thyromegaly.     Vascular: No JVD.  Cardiovascular:     Rate and Rhythm:  Normal rate and regular rhythm.     Heart sounds: Normal heart sounds. No murmur heard.  No friction rub. No gallop.   Pulmonary:     Effort: Pulmonary effort is normal. No respiratory distress.     Breath sounds: Normal breath sounds. No wheezing or rales.  Abdominal:     General: Bowel sounds are normal. There is no distension.     Palpations: Abdomen is soft. There is no mass.     Tenderness: There is no abdominal tenderness.  Musculoskeletal:        General: No tenderness. Normal range of motion.     Cervical back: Normal range of motion and neck supple.  Lymphadenopathy:     Cervical: No cervical adenopathy.  Skin:    General: Skin is warm and dry.     Findings: No erythema or rash.  Neurological:     Mental Status: She is alert.     Coordination: Coordination normal.     Comments: No facial droop, no slurred speech, able to say words clearly, she is slow to answer, she is able to lift both legs off the bed for 5 seconds both arms in the air for 5 seconds, she can count my fingers, she can move her tongue out of her mouth to midline.  She has some extinction on the left able to detect sensation bilaterally  however when individually tested  Psychiatric:        Behavior: Behavior normal.     ED Results / Procedures / Treatments   Labs (all labs ordered are listed, but only abnormal results are displayed) Labs Reviewed  CBG MONITORING, ED - Abnormal; Notable for the following components:      Result Value   Glucose-Capillary 112 (*)    All other components within normal limits  SARS CORONAVIRUS 2 BY RT PCR (HOSPITAL ORDER, [facility])  ETHANOL  PROTIME-INR  APTT  CBC  DIFFERENTIAL  COMPREHENSIVE METABOLIC PANEL  RAPID URINE DRUG SCREEN, HOSP PERFORMED  URINALYSIS, ROUTINE W REFLEX MICROSCOPIC  I-STAT CHEM 8, ED    EKG None  Radiology No results found.  Procedures Procedures (including critical care time)  Medications Ordered in ED Medications - No data to display  ED Course  I have reviewed the triage vital signs and the nursing notes.  Pertinent labs & imaging results that were available during my care of the patient were reviewed by me and considered in my medical decision making (see chart for details).    MDM Rules/Calculators/A&P                          This patient may very well have had a slight stroke or TIA, she does not appear to be in distress, does not have any large deficits on exam, neurology was present on arrival and has accompanied the patient to the [place] scanner.  Code stroke was activated at the neurology request.  This patient has been seen by neurology, Dr. Malen Gauze recommends that she have an MRI to rule out an acute stroke, if everything is normal she may be discharged back to the facility. Outpatient follow-up definitely recommended.  Final Clinical Impression(s) / ED Diagnoses Final diagnoses:  None    Rx / DC Orders ED Discharge Orders    None       Noemi Chapel, MD 11/02/19 [phone]

## 2019-10-31 NOTE — Consult Note (Addendum)
Neurology Consultation  Reason for Consult: code stroke for worsening dysarthria Referring Physician: Dr   CC: Slurred speech  History is obtained from: Chart review, EMS, daughter over the phone  HPI: Susan Potter is a 86 y.o. female who has a past medical history of paroxysmal atrial fibrillation on Coumadin, diabetes, hypertension, prior history of retinal vein occlusion, currently in a rehabilitation facility/skilled nursing home after sustaining tibial plateau fracture on the right lower extremity, recent admission for altered mental status likely secondary to UTI versus worsening dementia, brought in for concerns for worsening speech. Last known well reported at 9 AM when she was rounded on in the facility and at a later time around 12:45 PM during rounds, the patient was noted to have mild slurred speech.  EMS was called who also noted that the patient had some slurred speech and brought her for further evaluation as a code stroke since she was within 6 hours of last normal. Patient's family has not seen her since Sunday.  They have reported that ever since she broke her leg and has been in rehab, she has been withdrawn, talks much less than usual and has not been like normal self.  Prior to sustaining a fracture in mid May, she was living independently, she was not driving but doing her finances herself.  Currently at the facility, she barely walks with a walker and is essentially bed or chair bound for most part of the day.  LKW: 9 AM on 10/31/2019 tpa given?: no, outside the window Premorbid modified Rankin scale (mRS): 4  ASN:[id number] to obtain due to altered mental status.   Past Medical History:  Diagnosis Date  . AF (paroxysmal atrial fibrillation) ([facility]) 08/29/2019  . Atrial fibrillation ([place])   . Diabetes mellitus without complication ([facility])   . Hypertension   . Mixed diabetic hyperlipidemia associated with type 2 diabetes mellitus ([place]) 08/29/2019  . Osteoporosis   .  Retinal vein occlusion   . Type 2 diabetes mellitus without complication, without long-term current use of insulin ([facility]) 08/29/2019  . Vitamin D deficiency     Family History  Problem Relation Age of Onset  . Healthy Mother   . Early death Father   . Heart disease Brother    Social History:   reports that she has never smoked. She has never used smokeless tobacco. She reports that she does not drink alcohol and does not use drugs.  Medications No current facility-administered medications for this encounter.  Current Outpatient Medications:  .  acetaminophen (TYLENOL) 650 MG CR tablet, Take 650 mg by mouth every 6 (six) hours as needed for pain. , Disp: , Rfl:  .  atorvastatin (LIPITOR) 10 MG tablet, Take 1 tablet (10 mg total) by mouth daily. (Patient taking differently: Take 10 mg by mouth daily. At 2100), Disp: 90 tablet, Rfl: 1 .  bisoprolol-hydrochlorothiazide (ZIAC) 10-6.25 MG tablet, Take 1 tablet by mouth daily. (Patient taking differently: Take 1 tablet by mouth daily. At 0900), Disp: 90 tablet, Rfl: 1 .  Blood Glucose Monitoring Suppl (ONE TOUCH ULTRA SYSTEM KIT) W/DEVICE KIT, One touch Ultra 2 Glucometer, Disp: 1 each, Rfl: 0 .  ergocalciferol (VITAMIN D2) 1.25 MG (50000 UT) capsule, Take 1 capsule (50,000 Units total) by mouth See admin instructions. Take 1 capsule by mouth once every 15 days. (Patient taking differently: Take 50,000 Units by mouth every 14 (fourteen) days. ), Disp: 2 capsule, Rfl: 3 .  glimepiride (AMARYL) 2 MG tablet, TAKE 1.5 TABLETS BY  MOUTH EVERY DAY AT DINNER TIME (Patient taking differently: Take 3 mg by mouth every evening. At 1700), Disp: 45 tablet, Rfl: 3 .  glucose blood test strip, One touch Ultra 2 test strips only, Disp: 200 each, Rfl: 5 .  indapamide (LOZOL) 1.25 MG tablet, Take 1 tablet (1.25 mg total) by mouth daily. (Patient not taking: Reported on 09/18/2019), Disp: 30 tablet, Rfl: 3 .  Infant Care Products (DERMACLOUD) CREA, Apply 1 application  topically in the morning, at noon, and at bedtime. Apply to stomach folds every shift for irritation, Disp: , Rfl:  .  insulin glargine (LANTUS) 100 unit/mL SOPN, Inject 10 Units into the skin at bedtime., Disp: , Rfl:  .  magnesium oxide (MAG-OX) 400 MG tablet, Take 400 mg by mouth daily. At 0900, Disp: , Rfl:  .  Nutritional Supplements (FEEDING SUPPLEMENT, BOOST BREEZE,) LIQD, Take 1 Container by mouth 2 (two) times daily. At 0900 and 1800, Disp: , Rfl:  .  pantoprazole (PROTONIX) 40 MG tablet, Take 1 tablet (40 mg total) by mouth daily. (Patient taking differently: Take 40 mg by mouth daily. At 0630), Disp: 30 tablet, Rfl: 0 .  raloxifene (EVISTA) 60 MG tablet, Take 1 tablet (60 mg total) by mouth daily. (Patient taking differently: Take 60 mg by mouth daily. At 0900), Disp: 30 tablet, Rfl: 3 .  sitaGLIPtin-metformin (JANUMET) 50-1000 MG tablet, Take 1 tablet by mouth 2 (two) times daily with a meal. (Patient taking differently: Take 1 tablet by mouth in the morning and at bedtime. At 0900 and 2100), Disp: 180 tablet, Rfl: 1 .  traMADol (ULTRAM) 50 MG tablet, Take 1 tablet (50 mg total) by mouth every 6 (six) hours as needed for moderate pain. (Patient not taking: Reported on 09/18/2019), Disp: 10 tablet, Rfl: 0 .  warfarin (COUMADIN) 2.5 MG tablet, Take 2.5 mg by mouth at bedtime., Disp: , Rfl:  .  warfarin (COUMADIN) 5 MG tablet, Take 1 tablet daily or Take as directed by anticoagulation clinic, Disp: 30 tablet, Rfl: 3  Exam: Current vital signs: BP 111/76 (BP Location: Right Arm)   Pulse 78   Temp (!) 97.4 F (36.3 C) (Oral)   Resp 16   Ht '5\' 7"'  (1.702 m)   Wt 93 kg   SpO2 98%   BMI 32.11 kg/m  Vital signs in last 24 hours: Temp:  [97.4 F (36.3 C)] 97.4 F (36.3 C) (07/13 1404) Pulse Rate:  [78] 78 (07/13 1404) Resp:  [16] 16 (07/13 1404) BP: (111)/(76) 111/76 (07/13 1404) SpO2:  [97 %-98 %] 98 % (07/13 1404) Weight:  [93 kg] 93 kg (07/13 1409) General: Awake alert in no  distress HEENT: Cephalic atraumatic Chest:Clear to auscultation Cardiovascular: Regular rhythm Abdomen soft nondistended nontender Extremities warm well perfused Neurological exam Awake alert oriented x3 Speech and language: She is hypophonic but there is no significant dysarthria.  There is no evidence of aphasia.  She follows all commands. Cranial nerves: Pupils equal round react light, extraocular movements initially looked like they are mildly impaired with some right gaze preference and inability to take all the way to the left but later on when doing formal testing with the cards to read then she was able to look in both sides without an issue.  Face appears symmetric.  Tongue and palate midline. Motor exam: She is able to raise both upper extremities against gravity without any drift but was reported that prior to my examination, she had mild left upper extremity drift.  Both her lower extremities are symmetrically weak and she cannot raise them above the bed for more than 1 to 2 seconds. Sensory exam: Intact to touch all over Coordination: No gross dysmetria noted NIH stroke scale 1a Level of Conscious.: 0 1b LOC Questions: 0 1c LOC Commands: 0 2 Best Gaze: 1 3 Visual: 0 4 Facial Palsy: 0 5a Motor Arm - left: 0 5b Motor Arm - Right: 0 6a Motor Leg - Left: 2 6b Motor Leg - Right: 2 7 Limb Ataxia: 0 8 Sensory: 0 9 Best Language: 0 10 Dysarthria: 0 11 Extinct. and Inatten.: 0 TOTAL: 5    Labs I have reviewed labs in epic and the results pertinent to this consultation are:  CBC    Component Value Date/Time   WBC 11.9 (H) 10/31/2019 1338   RBC 3.88 10/31/2019 1338   HGB 11.9 (L) 10/31/2019 1342   HCT 35.0 (L) 10/31/2019 1342   PLT 271 10/31/2019 1338   MCV 92.3 10/31/2019 1338   MCH 29.4 10/31/2019 1338   MCHC 31.8 10/31/2019 1338   RDW 15.5 10/31/2019 1338   LYMPHSABS 1.4 10/31/2019 1338   MONOABS 1.0 10/31/2019 1338   EOSABS 0.2 10/31/2019 1338   BASOSABS 0.1  10/31/2019 1338    CMP     Component Value Date/Time   NA 138 10/31/2019 1342   K 4.3 10/31/2019 1342   CL 98 10/31/2019 1342   CO2 26 09/20/2019 0644   GLUCOSE 113 (H) 10/31/2019 1342   BUN 17 10/31/2019 1342   CREATININE 1.20 (H) 10/31/2019 1342   CALCIUM 8.7 (L) 09/20/2019 0644   PROT 5.9 (L) 09/19/2019 0342   ALBUMIN 2.5 (L) 09/19/2019 0342   AST 14 (L) 09/19/2019 0342   ALT 10 09/19/2019 0342   ALKPHOS 80 09/19/2019 0342   BILITOT 1.2 09/19/2019 0342   GFRNONAA 43 (L) 09/20/2019 0644   GFRAA 50 (L) 09/20/2019 0644    Lipid Panel     Component Value Date/Time   CHOL 130 03/07/2018 0842   TRIG 96.0 03/07/2018 0842   HDL 42.50 03/07/2018 0842   CHOLHDL 3 03/07/2018 0842   VLDL 19.2 03/07/2018 0842   LDLCALC 68 03/07/2018 0842   LDLDIRECT 84.6 01/08/2014 0845   Imaging I have reviewed the images obtained:  CT-scan of the brain-stable changes of a geriatric brain.  No acute changes.  No bleed.  Assessment: 86 year old with past medical history of paroxysmal A. fib on Coumadin, diabetes, hypertension, currently in rehab after having sustained a right leg fracture in a recent admission for altered mental status likely secondary to UTI presenting for worsening dysarthria. On my examination, she is more hypophonic than dysarthric. Not a candidate for TPA-outside the window Not a candidate for thrombectomy-symptoms not consistent with a large vessel occlusion as well as poor baseline modified Rankin score. Family reports that she has been withdrawn and flat affect since her fracture and has not been like her normal self which was independent in taking care of her own affairs prior to the fracture.  Differentials include: -Stroke -Toxic metabolic encephalopathy -Behavioral changes associated with dementia  Recommendations: Check CBC UA CMP Get MRI brain w/o contrast TSH, B12 If MRI does not reveal a stroke, no further stroke work-up is necessary. Plan discussed with  Dr. Sabra Heck over the phone. Please call with questions. -- Amie Portland, MD [facility] Pager: [phone] If 7pm to 7am, please call on call as listed on AMION.  CRITICAL CARE ATTESTATION Performed by: Amie Portland,  MD Total critical care time: 45 minutes Critical care time was exclusive of separately billable procedures and treating other patients and/or supervising APPs/Residents/Students Critical care was necessary to treat or prevent imminent or life-threatening deterioration due to toxic metabolic encephalopathy, probable behavioral changes associated with dementia, emergent stroke evaluation This patient is critically ill and at significant risk for neurological worsening and/or death and care requires constant monitoring. Critical care was time spent personally by me on the following activities: development of treatment plan with patient and/or surrogate as well as nursing, discussions with consultants, evaluation of patient's response to treatment, examination of patient, obtaining history from patient or surrogate, ordering and performing treatments and interventions, ordering and review of laboratory studies, ordering and review of radiographic studies, pulse oximetry, re-evaluation of patient's condition, participation in multidisciplinary rounds and medical decision making of high complexity in the care of this patient.  ADDENDUM MRI brain reviewed. Two acute strokes seen - Rt caudate head and Rt lentiform nucleus. Admit for stroke w/u  -Telemetry monitoring -Allow for permissive hypertension for the first 24-48h - only treat PRN if SBP >220 mmHg. Blood pressures can be gradually normalized to SBP<140 upon discharge. -Echocardiogram -MRA head w/o contrast and carotid dopplers -HgbA1c, fasting lipid panel -Frequent neuro checks -Can continue coumadin - small stroke size with low chance of HT. -Atorvastatin 80 mg PO daily -Risk factor modification -I discussed the  importance of exercise as well as smoking/alcohol/illicit drug use cessation. -PT consult, OT consult, Speech consult  Please page stroke NP/PA/MD (listed on AMION)  from 8am-4 pm as this patient will be followed by the stroke team at this point.  -- Amie [place], MD [facility] Pager: [phone] If 7pm to 7am, please call on call as listed on AMION.

## 2019-10-31 NOTE — ED Notes (Signed)
Attempted swallow screen again as patient is now less lethargic and stays awake during conversation. Pt pauses while drinking and coughs, fails swallow screen.

## 2019-10-31 NOTE — Progress Notes (Addendum)
Addendum:  - Patient failed swallow study evaluation. MD requested pharmacy to dose heparin.  - Start Heparin drip @ 1100 units/hr  - Heparin level in ~ 8 hours  - Heparin Level goal 0.3-0.5 with no boluses    ANTICOAGULATION CONSULT NOTE  Pharmacy Consult for Warfarin Indication: atrial fibrillation and stroke  No Known Allergies  Patient Measurements: Height: 5\' 7"  (170.2 cm) Weight: 93 kg (205 lb) IBW/kg (Calculated) : 61.6 Heparin Dosing Weight:   Vital Signs: Temp: 97.4 F (36.3 C) (07/13 1404) Temp Source: Oral (07/13 1404) BP: 110/61 (07/13 1600) Pulse Rate: 77 (07/13 1934)  Labs: Recent Labs    10/31/19 1338 10/31/19 1342  HGB 11.4* 11.9*  HCT 35.8* 35.0*  PLT 271  --   APTT 26  --   LABPROT 15.7*  --   INR 1.3*  --   CREATININE 1.18* 1.20*    Estimated Creatinine Clearance: 35.8 mL/min (A) (by C-G formula based on SCr of 1.2 mg/dL (H)).   Medical History: Past Medical History:  Diagnosis Date  . AF (paroxysmal atrial fibrillation) ([facility]) 08/29/2019  . Atrial fibrillation ([facility])   . Diabetes mellitus without complication ([facility])   . Hypertension   . Mixed diabetic hyperlipidemia associated with type 2 diabetes mellitus ([facility]) 08/29/2019  . Osteoporosis   . Retinal vein occlusion   . Type 2 diabetes mellitus without complication, without long-term current use of insulin ([facility]) 08/29/2019  . Vitamin D deficiency     Medications:  Scheduled:  .  stroke: mapping our early stages of recovery book   Does not apply Once  . atorvastatin  10 mg Oral Daily  . [START ON 11/01/2019] insulin aspart  0-9 Units Subcutaneous TID WC  . insulin glargine  10 Units Subcutaneous QHS  . raloxifene  60 mg Oral Daily    Assessment: Patient is a 68 yof that is on warfarin at home for afib. The patient present today as a code stroke however she was outside the TPA window. The patient was found to have two acute strokes on MRI. Pharmacy has been asked to continue to dose her  warfarin for afib and new recent stroke..  Warfarin home regimen: Warfarin 2mg  PO daily   Goal of Therapy:  INR 2-3 Monitor platelets by anticoagulation protocol: Yes   Plan:  - INR currently subtherapeutic at 1.3 - With recent stroke and subtherapeutic INR will increase her dose today to Warfarin 4mg  PO x 1 dose  - Monitor patient for s/s of bleeding  - Daily PT-INR and monitor cbc while on warfarin   82 PharmD. BCPS  10/31/2019,8:01 PM

## 2019-10-31 NOTE — H&P (Signed)
TRH H&P    Patient Demographics:    Susan Potter, is a 86 y.o. female  MRN: [id number]  DOB - 1927/03/24  Admit Date - 10/31/2019  Referring MD/NP/PA: Noemi Chapel  Outpatient Primary MD for the patient is Elayne Snare, MD  Patient coming from: Skilled nursing facility  Chief complaint-slurred speech   HPI:    Susan Potter  is a 86 y.o. female, with medical history of paroxysmal atrial fibrillation on chronic anticoagulant with Coumadin, diabetes mellitus type 2, vitamin D deficiency who was brought to the hospital from skilled facility after patient had slurred speech at skilled nursing facility around 9 AM.  When paramedics arrived she was noted to have some difficulty with speaking but otherwise seem to be at baseline. Patient was brought to the ED, did not have any neurological deficits on exam.  Code stroke was activated.  CT head was negative.  She was outside window for TPA. MRI brain showed 12 mm acute infarct within the right caudate head, 60 mm acute infarct in the right lentiform nucleus. Patient denies chest pain or shortness of breath. Denies nausea vomiting or diarrhea   Review of systems:    In addition to the HPI above,    All other systems reviewed and are negative.    Past History of the following :    Past Medical History:  Diagnosis Date  . AF (paroxysmal atrial fibrillation) ([place]) 08/29/2019  . Atrial fibrillation ([place])   . Diabetes mellitus without complication ([place])   . Hypertension   . Mixed diabetic hyperlipidemia associated with type 2 diabetes mellitus ([place]) 08/29/2019  . Osteoporosis   . Retinal vein occlusion   . Type 2 diabetes mellitus without complication, without long-term current use of insulin ([place]) 08/29/2019  . Vitamin D deficiency       History reviewed. No pertinent surgical history.    Social History:      Social History   Tobacco Use  . Smoking  status: Never Smoker  . Smokeless tobacco: Never Used  Substance Use Topics  . Alcohol use: Never       Family History :     Family History  Problem Relation Age of Onset  . Healthy Mother   . Early death Father   . Heart disease Brother       Home Medications:   Prior to Admission medications   Medication Sig Start Date End Date Taking? Authorizing Provider  acetaminophen (TYLENOL) 650 MG CR tablet Take 650 mg by mouth every 6 (six) hours as needed for pain.    Yes [provider]  atorvastatin (LIPITOR) 10 MG tablet Take 1 tablet (10 mg total) by mouth daily. Patient taking differently: Take 10 mg by mouth daily. At 2100 03/14/19  Yes Elayne Snare, MD  bisoprolol-hydrochlorothiazide [facility]) 10-6.25 MG tablet Take 1 tablet by mouth daily. Patient taking differently: Take 1 tablet by mouth daily. At 0900 03/14/19  Yes Elayne Snare, MD  Blood Glucose Monitoring Suppl (ONE TOUCH ULTRA SYSTEM KIT) W/DEVICE KIT One touch Ultra 2 Glucometer  Patient taking differently: 1 kit by Does not apply route See admin instructions. One touch Ultra 2 Glucometer 11/11/12  Yes Elayne Snare, MD  ergocalciferol (VITAMIN D2) 1.25 MG (50000 UT) capsule Take 1 capsule (50,000 Units total) by mouth See admin instructions. Take 1 capsule by mouth once every 15 days. Patient taking differently: Take 50,000 Units by mouth See admin instructions. Every 15 days. 06/29/19  Yes Elayne Snare, MD  glimepiride (AMARYL) 2 MG tablet TAKE 1.5 TABLETS BY MOUTH EVERY DAY AT [facility] TIME Patient taking differently: Take 3 mg by mouth every evening. At 1700 04/13/19  Yes Elayne Snare, MD  glucagon (GLUCAGEN) 1 MG SOLR injection Inject 1 mg into the vein once as needed for low blood sugar.   Yes [provider]  glucose blood test strip One touch Ultra 2 test strips only Patient taking differently: 1 each by Other route See admin instructions. One touch Ultra 2 test strips only 12/16/15  Yes Elayne Snare, MD  Infant  Care Products [facility]) CREA Apply 1 application topically in the morning, at noon, and at bedtime. Apply to stomach folds every shift for irritation   Yes [provider]  insulin glargine (LANTUS) 100 unit/mL SOPN Inject 10 Units into the skin at bedtime.   Yes [provider]  magnesium oxide (MAG-OX) 400 MG tablet Take 400 mg by mouth daily. At 0900   Yes [provider]  Nutritional Supplements (FEEDING SUPPLEMENT, BOOST BREEZE,) LIQD Take 1 Container by mouth daily.    Yes [provider]  pantoprazole (PROTONIX) 40 MG tablet Take 1 tablet (40 mg total) by mouth daily. Patient taking differently: Take 40 mg by mouth daily. At 0630 09/04/19  Yes Eugenie Filler, MD  raloxifene (EVISTA) 60 MG tablet Take 1 tablet (60 mg total) by mouth daily. Patient taking differently: Take 60 mg by mouth daily. At 0900 06/29/19  Yes Elayne Snare, MD  sitaGLIPtin-metformin (JANUMET) 50-1000 MG tablet Take 1 tablet by mouth 2 (two) times daily with a meal. Patient taking differently: Take 1 tablet by mouth in the morning and at bedtime. At 0900 and 2100 03/14/19  Yes Elayne Snare, MD  warfarin (COUMADIN) 2 MG tablet Take 2 mg by mouth at bedtime.   Yes [provider]  warfarin (COUMADIN) 2.5 MG tablet Take 1 mg by mouth at bedtime.    Yes [provider]  indapamide (LOZOL) 1.25 MG tablet Take 1 tablet (1.25 mg total) by mouth daily. Patient not taking: Reported on 09/18/2019 06/21/19   Elayne Snare, MD  traMADol (ULTRAM) 50 MG tablet Take 1 tablet (50 mg total) by mouth every 6 (six) hours as needed for moderate pain. Patient not taking: Reported on 09/18/2019 09/04/19   Eugenie Filler, MD  warfarin (COUMADIN) 5 MG tablet Take 1 tablet daily or Take as directed by anticoagulation clinic Patient not taking: Reported on 10/31/2019 04/19/19   Elayne Snare, MD     Allergies:    No Known Allergies   Physical Exam:   Vitals  Blood pressure 110/61, pulse  75, temperature (!) 97.4 F (36.3 C), temperature source Oral, resp. rate 15, height '5\' 7"'  (1.702 m), weight 93 kg, SpO2 98 %.  1.  General: Appears in no acute distress  2. Psychiatric: Alert, oriented x2, oriented to self and place  3. Neurologic: Cranial nerves II through XII grossly intact, moving all extremities, no focal deficit noted  4. HEENMT:  Atraumatic normocephalic, extraocular muscles are intact  5.  Respiratory : Clear to auscultation bilaterally, no wheezing or crackles auscultated  6. Cardiovascular : S1-S2, regular, no murmur auscultated  7. Gastrointestinal:  Abdomen is soft, nontender, no organomegaly      Data Review:    CBC Recent Labs  Lab 10/31/19 1338 10/31/19 1342  WBC 11.9*  --   HGB 11.4* 11.9*  HCT 35.8* 35.0*  PLT 271  --   MCV 92.3  --   MCH 29.4  --   MCHC 31.8  --   RDW 15.5  --   LYMPHSABS 1.4  --   MONOABS 1.0  --   EOSABS 0.2  --   BASOSABS 0.1  --    ------------------------------------------------------------------------------------------------------------------  Results for orders placed or performed during the hospital encounter of 10/31/19 (from the past 48 hour(s))  CBG monitoring, ED     Status: Abnormal   Collection Time: 10/31/19  1:36 PM  Result Value Ref Range   Glucose-Capillary 112 (H) 70 - 99 mg/dL    Comment: Glucose reference range applies only to samples taken after fasting for at least 8 hours.  Ethanol     Status: None   Collection Time: 10/31/19  1:38 PM  Result Value Ref Range   Alcohol, Ethyl (B) <10 <10 mg/dL    Comment: (NOTE) Lowest detectable limit for serum alcohol is 10 mg/dL.  For medical purposes only. Performed at [facility], [place] [address], [place], [place] [address]   Protime-INR     Status: Abnormal   Collection Time: 10/31/19  1:38 PM  Result Value Ref Range   Prothrombin Time 15.7 (H) 11.4 - 15.2 seconds   INR 1.3 (H) 0.8 - 1.2    Comment: (NOTE) INR goal varies based  on device and disease states. Performed at [facility], [place] [address], [place], [place] [address]   APTT     Status: None   Collection Time: 10/31/19  1:38 PM  Result Value Ref Range   aPTT 26 24 - 36 seconds    Comment: Performed at [facility] [address] [place], [place] [address]  CBC     Status: Abnormal   Collection Time: 10/31/19  1:38 PM  Result Value Ref Range   WBC 11.9 (H) 4.0 - 10.5 K/uL   RBC 3.88 3.87 - 5.11 MIL/uL   Hemoglobin 11.4 (L) 12.0 - 15.0 g/dL   HCT 35.8 (L) 36 - 46 %   MCV 92.3 80.0 - 100.0 fL   MCH 29.4 26.0 - 34.0 pg   MCHC 31.8 30.0 - 36.0 g/dL   RDW 15.5 11.5 - 15.5 %   Platelets 271 150 - 400 K/uL   nRBC 0.0 0.0 - 0.2 %    Comment: Performed at [facility], [place] [address], [place],  [address]  Differential     Status: Abnormal   Collection Time: 10/31/19  1:38 PM  Result Value Ref Range   Neutrophils Relative % 77 %   Neutro Abs 9.1 (H) 1.7 - 7.7 K/uL   Lymphocytes Relative 12 %   Lymphs Abs 1.4 0.7 - 4.0 K/uL   Monocytes Relative 8 %   Monocytes Absolute 1.0 0 - 1 K/uL   Eosinophils Relative 2 %   Eosinophils Absolute 0.2 0 - 0 K/uL   Basophils Relative 1 %   Basophils Absolute 0.1 0 - 0 K/uL   Immature Granulocytes 0 %   Abs Immature Granulocytes 0.05 0.00 - 0.07 K/uL  Comment: Performed at [facility], [place] [address], [place], [place] [address]  Comprehensive metabolic panel     Status: Abnormal   Collection Time: 10/31/19  1:38 PM  Result Value Ref Range   Sodium 138 135 - 145 mmol/L   Potassium 4.4 3.5 - 5.1 mmol/L   Chloride 99 98 - 111 mmol/L   CO2 27 22 - 32 mmol/L   Glucose, Bld 119 (H) 70 - 99 mg/dL    Comment: Glucose reference range applies only to samples taken after fasting for at least 8 hours.   BUN 14 8 - 23 mg/dL   Creatinine, Ser 1.18 (H) 0.44 - 1.00 mg/dL   Calcium 9.1 8.9 - 10.3 mg/dL   Total Protein 6.8 6.5 - 8.1 g/dL   Albumin 2.7 (L) 3.5 - 5.0 g/dL   AST 14 (L) 15 - 41  U/L   ALT 11 0 - 44 U/L   Alkaline Phosphatase 58 38 - 126 U/L   Total Bilirubin 0.6 0.3 - 1.2 mg/dL   GFR calc non Af Amer 40 (L) >60 mL/min   GFR calc Af Amer 47 (L) >60 mL/min   Anion gap 12 5 - 15    Comment: Performed at [facility] [address] [place], [place] [address]  I-stat chem 8, ED     Status: Abnormal   Collection Time: 10/31/19  1:42 PM  Result Value Ref Range   Sodium 138 135 - 145 mmol/L   Potassium 4.3 3.5 - 5.1 mmol/L   Chloride 98 98 - 111 mmol/L   BUN 17 8 - 23 mg/dL   Creatinine, Ser 1.20 (H) 0.44 - 1.00 mg/dL   Glucose, Bld 113 (H) 70 - 99 mg/dL    Comment: Glucose reference range applies only to samples taken after fasting for at least 8 hours.   Calcium, Ion 1.10 (L) 1.15 - 1.40 mmol/L   TCO2 27 22 - 32 mmol/L   Hemoglobin 11.9 (L) 12.0 - 15.0 g/dL   HCT 35.0 (L) 36 - 46 %  SARS Coronavirus 2 by RT PCR (hospital order, performed in [facility] hospital lab) Nasopharyngeal Nasopharyngeal Swab     Status: None   Collection Time: 10/31/19  2:24 PM   Specimen: Nasopharyngeal Swab  Result Value Ref Range   SARS Coronavirus 2 NEGATIVE NEGATIVE    Comment: (NOTE) SARS-CoV-2 target nucleic acids are NOT DETECTED.  The SARS-CoV-2 RNA is generally detectable in upper and lower respiratory specimens during the acute phase of infection. The lowest concentration of SARS-CoV-2 viral copies this assay can detect is 250 copies / mL. A negative result does not preclude SARS-CoV-2 infection and should not be used as the sole basis for treatment or other patient management decisions.  A negative result may occur with improper specimen collection / handling, submission of specimen other than nasopharyngeal swab, presence of viral mutation(s) within the areas targeted by this assay, and inadequate number of viral copies (<250 copies / mL). A negative result must be combined with clinical observations, patient history, and epidemiological information.  Fact  Sheet for Patients:   StrictlyIdeas.no  Fact Sheet for Healthcare Providers: BankingDealers.co.za  This test is not yet approved or  cleared by the [place] FDA and has been authorized for detection and/or diagnosis of SARS-CoV-2 by FDA under an Emergency Use Authorization (EUA).  This EUA will remain in effect (meaning this test can be used) for the duration of the COVID-19 declaration under Section  564(b)(1) of the Act, 21 U.S.C. section 360bbb-3(b)(1), unless the authorization is terminated or revoked sooner.  Performed at [facility], [place] [address] [place], [place] [address]     Chemistries  Recent Labs  Lab 10/31/19 1338 10/31/19 1342  NA 138 138  K 4.4 4.3  CL 99 98  CO2 27  --   GLUCOSE 119* 113*  BUN 14 17  CREATININE 1.18* 1.20*  CALCIUM 9.1  --   AST 14*  --   ALT 11  --   ALKPHOS 58  --   BILITOT 0.6  --    ------------------------------------------------------------------------------------------------------------------  ------------------------------------------------------------------------------------------------------------------ GFR: Estimated Creatinine Clearance: 35.8 mL/min (A) (by C-G formula based on SCr of 1.2 mg/dL (H)). Liver Function Tests: Recent Labs  Lab 10/31/19 1338  AST 14*  ALT 11  ALKPHOS 58  BILITOT 0.6  PROT 6.8  ALBUMIN 2.7*   No results for input(s): LIPASE, AMYLASE in the last 168 hours. No results for input(s): AMMONIA in the last 168 hours. Coagulation Profile: Recent Labs  Lab 10/31/19 1338  INR 1.3*   Cardiac Enzymes: No results for input(s): CKTOTAL, CKMB, CKMBINDEX, TROPONINI in the last 168 hours. BNP (last 3 results) No results for input(s): PROBNP in the last 8760 hours. HbA1C: No results for input(s): HGBA1C in the last 72 hours. CBG: Recent Labs  Lab 10/31/19 1336  GLUCAP 112*   Lipid Profile: No results for input(s): CHOL, HDL,  LDLCALC, TRIG, CHOLHDL, LDLDIRECT in the last 72 hours. Thyroid Function Tests: No results for input(s): TSH, T4TOTAL, FREET4, T3FREE, THYROIDAB in the last 72 hours. Anemia Panel: No results for input(s): VITAMINB12, FOLATE, FERRITIN, TIBC, IRON, RETICCTPCT in the last 72 hours.  --------------------------------------------------------------------------------------------------------------- Urine analysis:    Component Value Date/Time   COLORURINE YELLOW 09/18/2019 1544   APPEARANCEUR HAZY (A) 09/18/2019 1544   LABSPEC 1.011 09/18/2019 1544   PHURINE 5.0 09/18/2019 1544   GLUCOSEU NEGATIVE 09/18/2019 1544   GLUCOSEU NEGATIVE 09/12/2014 0815   HGBUR NEGATIVE 09/18/2019 1544   BILIRUBINUR NEGATIVE 09/18/2019 1544   KETONESUR NEGATIVE 09/18/2019 1544   PROTEINUR NEGATIVE 09/18/2019 1544   UROBILINOGEN 0.2 09/12/2014 0815   NITRITE NEGATIVE 09/18/2019 1544   LEUKOCYTESUR SMALL (A) 09/18/2019 1544      Imaging Results:    MR BRAIN WO CONTRAST  Result Date: 10/31/2019 CLINICAL DATA:  Neuro deficit, acute, stroke suspected. Additional history obtained from radiology records, slurred speech. EXAM: MRI HEAD WITHOUT CONTRAST TECHNIQUE: Multiplanar, multiecho pulse sequences of the brain and surrounding structures were obtained without intravenous contrast. COMPARISON:  Noncontrast head CT performed earlier the same day. FINDINGS: Brain: Moderate generalized parenchymal atrophy. 12 mm focus of restricted diffusion within the right caudate head consistent with acute infarction. Additional 16 mm acute infarct within the right lentiform nucleus. No evidence of acute infarct elsewhere within the brain. Redemonstrated chronic lacunar infarcts within the bilateral thalami, left basal ganglia and right cerebellar. Background moderate to advanced patchy T2/FLAIR hyperintensity within the cerebral white matter which is nonspecific, but consistent with chronic small vessel ischemic disease. No evidence of  intracranial mass. No chronic intracranial blood products. No extra-axial fluid collection. No midline shift. Vascular: No definite loss of proximal large arterial flow voids. Skull and upper cervical spine: No focal marrow lesion. Sinuses/Orbits: Visualized orbits show no acute finding. No significant paranasal sinus disease or mastoid effusion at the imaged levels. IMPRESSION: Mildly motion degraded examination. 12 mm acute infarct within the right caudate head. 16 mm acute infarct within  the right lentiform nucleus. Background moderate generalized parenchymal atrophy and mild-to-moderate chronic small vessel ischemic disease. Chronic lacunar infarcts are present within the bilateral thalami, left basal ganglia and right cerebellum. Electronically Signed   By: Kellie Simmering DO   On: 10/31/2019 17:21   CT HEAD CODE STROKE WO CONTRAST  Result Date: 10/31/2019 CLINICAL DATA:  Code stroke. Slurred speech. Last seen normal 5.5 hours ago. EXAM: CT HEAD WITHOUT CONTRAST TECHNIQUE: Contiguous axial images were obtained from the base of the skull through the vertex without intravenous contrast. COMPARISON:  CT head without contrast 09/18/2019. FINDINGS: Brain: Advanced atrophy and white matter disease is similar the prior study. By lacunar infarcts of the thalami bilaterally are stable. Remote lacunar infarct in the left caudate head is stable. Basal ganglia are otherwise unremarkable. The insular ribbon is normal bilaterally. No acute cortical abnormalities are present. The ventricles are proportionate to the degree of atrophy. No significant extraaxial fluid collection is present. A remote lacunar infarct is present in the posterior right cerebellum. Vascular: Atherosclerotic calcifications are present within the cavernous internal carotid arteries bilaterally. Calcification is present at the dural margin of both vertebral arteries. No hyperdense vessel is present Skull: Calvarium is intact. No focal lytic or blastic  lesions are present. No significant extracranial soft tissue lesion is present. Sinuses/Orbits: The paranasal sinuses and mastoid air cells are clear. Left lens replacement is present. Globes and orbits are otherwise within normal limits. ASPECTS [facility] Stroke Program Early CT Score) - Ganglionic level infarction (caudate, lentiform nuclei, internal capsule, insula, M1-M3 cortex): 7/7 - Supraganglionic infarction (M4-M6 cortex): 3/3 Total score (0-10 with 10 being normal): 10/10 IMPRESSION: 1. No acute intracranial abnormality or significant interval change. 2. Stable advanced atrophy and white matter disease. 3. Stable remote lacunar infarcts of the thalami bilaterally, left caudate head, and posterior right cerebellum. The above was relayed via text pager to Dr. Rory Percy on 10/31/2019 at 13:51 . Electronically Signed   By: San Morelle M.D.   On: 10/31/2019 13:51    My personal review of EKG: Rhythm atrial fibrillation   Assessment & Plan:    Active Problems:   CVA (cerebral vascular accident) ([place])   1. Stroke-MRI head confirms stroke, shows 12 mm acute infarct in the right caudate head, 16 mm acute infarct in the right lentiform nucleus. We will admit patient, initiate stroke work-up.  Will obtain MRA angio head, carotid Dopplers, echocardiogram, lipid profile, hemoglobin A1c.  PT/OT evaluation.  Neurology has been consulted.  Obtain TSH, B12.  We will keep her n.p.o. till stroke swallow screen is done.  Continue anticoagulation with Coumadin. 2. Paroxysmal atrial fibrillation-heart rate is controlled, continue anticoagulation with Coumadin.  Will consult pharmacy for dosing of Coumadin. 3. Hypertension-hold antihypertensive medications for permissive hypertension. 4. Hyperlipidemia-continue Lipitor. 5. Diabetes mellitus type 2-continue Lantus 10 units subcu daily at bedtime.  Will start sliding scale insulin with NovoLog.    DVT Prophylaxis-   warfarin  AM Labs Ordered, also please  review Full Orders  Family Communication: No family at bedside, patient came from skilled facility with yellow DNR form.  Code Status: DNR   Admission status: Inpatient :The appropriate admission status for this patient is INPATIENT. Inpatient status is judged to be reasonable and necessary in order to provide the required intensity of service to ensure the patient's safety. The patient's presenting symptoms, physical exam findings, and initial radiographic and laboratory data in the context of their chronic comorbidities is felt to place them at high risk  for further clinical deterioration. Furthermore, it is not anticipated that the patient will be medically stable for discharge from the hospital within 2 midnights of admission. The following factors support the admission status of inpatient.     The patient's presenting symptoms include slurred speech. The worrisome physical exam findings include slurred speech. The initial radiographic and laboratory data are worrisome because of stroke. The chronic co-morbidities include diabetes mellitus type 2.       * I certify that at the point of admission it is my clinical judgment that the patient will require inpatient hospital care spanning beyond 2 midnights from the point of admission due to high intensity of service, high risk for further deterioration and high frequency of surveillance required.*  Time spent in minutes : 60 min   Cristol Engdahl S Arshiya Jakes M.D

## 2019-11-01 ENCOUNTER — Inpatient Hospital Stay (HOSPITAL_COMMUNITY): Payer: Medicare Other

## 2019-11-01 ENCOUNTER — Encounter (HOSPITAL_COMMUNITY): Payer: Medicare Other

## 2019-11-01 DIAGNOSIS — I63131 Cerebral infarction due to embolism of right carotid artery: Secondary | ICD-10-CM

## 2019-11-01 DIAGNOSIS — I482 Chronic atrial fibrillation, unspecified: Secondary | ICD-10-CM

## 2019-11-01 DIAGNOSIS — I351 Nonrheumatic aortic (valve) insufficiency: Secondary | ICD-10-CM

## 2019-11-01 DIAGNOSIS — I6521 Occlusion and stenosis of right carotid artery: Secondary | ICD-10-CM

## 2019-11-01 LAB — CBC
HCT: 33.4 % — ABNORMAL LOW (ref 36.0–46.0)
Hemoglobin: 10.3 g/dL — ABNORMAL LOW (ref 12.0–15.0)
MCH: 27.7 pg (ref 26.0–34.0)
MCHC: 30.8 g/dL (ref 30.0–36.0)
MCV: 89.8 fL (ref 80.0–100.0)
Platelets: 252 K/uL (ref 150–400)
RBC: 3.72 MIL/uL — ABNORMAL LOW (ref 3.87–5.11)
RDW: 15.2 % (ref 11.5–15.5)
WBC: 9 K/uL (ref 4.0–10.5)
nRBC: 0 % (ref 0.0–0.2)

## 2019-11-01 LAB — ECHOCARDIOGRAM COMPLETE
Height: 67 in
Weight: 3280 oz

## 2019-11-01 LAB — LIPID PANEL
Cholesterol: 106 mg/dL (ref 0–200)
HDL: 37 mg/dL — ABNORMAL LOW (ref >40)
LDL Cholesterol: 53 mg/dL (ref 0–99)
Total CHOL/HDL Ratio: 2.9 RATIO
Triglycerides: 81 mg/dL (ref <150)
VLDL: 16 mg/dL (ref 0–40)

## 2019-11-01 LAB — HEPARIN LEVEL (UNFRACTIONATED)
Heparin Unfractionated: 0.28 IU/mL — ABNORMAL LOW (ref 0.30–0.70)
Heparin Unfractionated: 0.75 IU/mL — ABNORMAL HIGH (ref 0.30–0.70)

## 2019-11-01 LAB — CBG MONITORING, ED
Glucose-Capillary: 64 mg/dL — ABNORMAL LOW (ref 70–99)
Glucose-Capillary: 71 mg/dL (ref 70–99)
Glucose-Capillary: 82 mg/dL (ref 70–99)
Glucose-Capillary: 74 mg/dL (ref 70–99)
Glucose-Capillary: 88 mg/dL (ref 70–99)

## 2019-11-01 LAB — VITAMIN B12: Vitamin B-12: 108 pg/mL — ABNORMAL LOW (ref 180–914)

## 2019-11-01 LAB — PROTIME-INR
INR: 1.8 — ABNORMAL HIGH (ref 0.8–1.2)
Prothrombin Time: 20 seconds — ABNORMAL HIGH (ref 11.4–15.2)

## 2019-11-01 LAB — GLUCOSE, CAPILLARY
Glucose-Capillary: 116 mg/dL — ABNORMAL HIGH (ref 70–99)
Glucose-Capillary: 115 mg/dL — ABNORMAL HIGH (ref 70–99)

## 2019-11-01 LAB — HEMOGLOBIN A1C
Hgb A1c MFr Bld: 6.1 % — ABNORMAL HIGH (ref 4.8–5.6)
Mean Plasma Glucose: 128.37 mg/dL

## 2019-11-01 MED ORDER — IOHEXOL 350 MG/ML SOLN
100.0000 mL | Freq: Once | INTRAVENOUS | Status: AC | PRN
  Administered 2019-11-01: 75 mL via INTRAVENOUS

## 2019-11-01 MED ORDER — DEXTROSE 50 % IV SOLN
12.5000 mL | Freq: Once | INTRAVENOUS | Status: AC
  Administered 2019-11-01: 12.5 mL via INTRAVENOUS
  Filled 2019-11-01: qty 50

## 2019-11-01 MED ORDER — SODIUM CHLORIDE 0.9 % IV SOLN
1.0000 g | Freq: Every day | INTRAVENOUS | Status: DC
  Administered 2019-11-01 – 2019-11-02 (×2): 1 g via INTRAVENOUS
  Filled 2019-11-01 (×2): qty 10

## 2019-11-01 MED ORDER — DEXTROSE-NACL 5-0.9 % IV SOLN: INTRAVENOUS | Status: DC

## 2019-11-01 NOTE — Progress Notes (Signed)
PROGRESS NOTE    Susan Potter  URK:[id number] DOB: 11/05/1926 DOA: 10/31/2019 PCP: [name], MD    Brief Susan Potter  is a 86 y.o. female, with medical history of paroxysmal atrial fibrillation on chronic anticoagulant with Coumadin, diabetes mellitus type 2, vitamin D deficiency who was brought to the hospital from skilled facility after patient had slurred speech at skilled nursing facility around 9 AM.  When paramedics arrived she was noted to have some difficulty with speaking but otherwise seem to be at baseline. Patient was brought to the ED, did not have any neurological deficits on exam.  Code stroke was activated.  CT head was negative.  She was outside window for TPA. MRI brain showed 12 mm acute infarct within the right caudate head, 60 mm acute infarct in the right lentiform nucleus. Patient denies chest pain or shortness of breath. Denies nausea vomiting or diarrhea  Assessment & Plan:   Active Problems:   CVA (cerebral vascular accident) (HCC)      #1 acute stroke with left-sided weakness patient with history of atrial fibrillation and chronic anticoagulation with Coumadin.  Work-up included MRI of the brain-12 mm acute infarct within the right caudate, 16 mm acute infarct within the right lentiform nucleus.  Generalized atrophy mild to moderate chronic small vessel ischemic changes and chronic lacunar infarcts are present within the bilateral thalamus and left basal ganglia and right cerebellum. CT angiogram of the head and neck embolus at the right carotid terminus slow flow visible in the right and ACA and MCA territories particularly MCA territory.  Nonstenotic atherosclerotic disease at both carotid bifurcations.  No flow in the left vertebral artery at the foramen magnum level. Echo ejection fraction 60 to 65%.  No wall motion abnormalities.  Right regular function is normal.  Normal pulmonary artery systolic pressure. LDL 53. Hemoglobin A1c 6.1.   INR is 1.8.  On heparin and Coumadin. PT OT and speech eval pending.  #2 chronic atrial fibrillation paroxysmal rate controlled and on Coumadin and heparin to achieve therapeutic INR.  #3 type 2 diabetes on Lantus. CBG (last 3)  Recent Labs    11/01/19 1053 11/01/19 1119 11/01/19 1204  GLUCAP 82 71 74   Hypoglycemic.  Lantus stopped.  Patient remains n.p.o. to be seen by speech therapy.  I have started her on D5 normal saline.  #4 essential hypertension blood pressure 118/75 not on any antihypertensives.  #5 hyperlipidemia on statins.  LDL 53.   Estimated body mass index is 32.11 kg/m as calculated from the following:   Height as of this encounter: 5\' 7"  (1.702 m).   Weight as of this encounter: 93 kg.  DVT prophylaxis: Coumadin Code Status: DNR Family Communication: None at bedside Disposition Plan:  Status is: Inpatient  Dispo: The patient is from: SNF              Anticipated d/c is to: SNF              Anticipated d/c date is: 2 days              Patient currently is not medically stable to d/c.  Patient admitted with acute stroke and work-up pending on IV heparin to achieve therapeutic INR on Coumadin.    Consultants: Neurology Procedures: None Antimicrobials:  None Subjective: Patient resting in bed opens her eyes but goes back to sleep right away.    Objective: Vitals:   11/01/19 0230 11/01/19 0445 11/01/19 0900 11/01/19 1230  BP:  127/61 (!) 116/58 118/75   Pulse: 78 82 75 73  Resp: 20 (!) 21 14 16   Temp:      TempSrc:      SpO2: 94% 93% 95% 98%  Weight:      Height:        Intake/Output Summary (Last 24 hours) at 11/01/2019 1426 Last data filed at 11/01/2019 1237 Gross per 24 hour  Intake 90 ml  Output --  Net 90 ml   Filed Weights   10/31/19 1409  Weight: 93 kg    Examination:  General exam: Appears calm and comfortable  Respiratory system: Clear to auscultation. Respiratory effort normal. Cardiovascular system: S1 & S2 heard, RRR. No  JVD, murmurs, rubs, gallops or clicks. No pedal edema. Gastrointestinal system: Abdomen is nondistended, soft and nontender. No organomegaly or masses felt. Normal bowel sounds heard. Central nervous system: 2 x 5 power in the left lower extremity and left upper extremity Extremities: No edema  skin: No rashes, lesions or ulcers Psychiatry: Unable to assess    Data Reviewed: I have personally reviewed following labs and imaging studies  CBC: Recent Labs  Lab 10/31/19 1338 10/31/19 1342 11/01/19 0327  WBC 11.9*  --  9.0  NEUTROABS 9.1*  --   --   HGB 11.4* 11.9* 10.3*  HCT 35.8* 35.0* 33.4*  MCV 92.3  --  89.8  PLT 271  --  252   Basic Metabolic Panel: Recent Labs  Lab 10/31/19 1338 10/31/19 1342  NA 138 138  K 4.4 4.3  CL 99 98  CO2 27  --   GLUCOSE 119* 113*  BUN 14 17  CREATININE 1.18* 1.20*  CALCIUM 9.1  --    GFR: Estimated Creatinine Clearance: 35.8 mL/min (A) (by C-G formula based on SCr of 1.2 mg/dL (H)). Liver Function Tests: Recent Labs  Lab 10/31/19 1338  AST 14*  ALT 11  ALKPHOS 58  BILITOT 0.6  PROT 6.8  ALBUMIN 2.7*   No results for input(s): LIPASE, AMYLASE in the last 168 hours. No results for input(s): AMMONIA in the last 168 hours. Coagulation Profile: Recent Labs  Lab 10/31/19 1338 11/01/19 0327  INR 1.3* 1.8*   Cardiac Enzymes: No results for input(s): CKTOTAL, CKMB, CKMBINDEX, TROPONINI in the last 168 hours. BNP (last 3 results) No results for input(s): PROBNP in the last 8760 hours. HbA1C: Recent Labs    10/31/19 1338 11/01/19 0327  HGBA1C 6.1* 6.1*   CBG: Recent Labs  Lab 10/31/19 2158 11/01/19 1023 11/01/19 1053 11/01/19 1119 11/01/19 1204  GLUCAP 89 64* 82 71 74   Lipid Profile: Recent Labs    11/01/19 0327  CHOL 106  HDL 37*  LDLCALC 53  TRIG 81  CHOLHDL 2.9   Thyroid Function Tests: No results for input(s): TSH, T4TOTAL, FREET4, T3FREE, THYROIDAB in the last 72 hours. Anemia Panel: No results for  input(s): VITAMINB12, FOLATE, FERRITIN, TIBC, IRON, RETICCTPCT in the last 72 hours. Sepsis Labs: No results for input(s): PROCALCITON, LATICACIDVEN in the last 168 hours.  Recent Results (from the past 240 hour(s))  SARS Coronavirus 2 by RT PCR (hospital order, performed in [facility] hospital lab) Nasopharyngeal Nasopharyngeal Swab     Status: None   Collection Time: 10/31/19  2:24 PM   Specimen: Nasopharyngeal Swab  Result Value Ref Range Status   SARS Coronavirus 2 NEGATIVE NEGATIVE Final    Comment: (NOTE) SARS-CoV-2 target nucleic acids are NOT DETECTED.  The SARS-CoV-2 RNA is generally detectable in upper  and lower respiratory specimens during the acute phase of infection. The lowest concentration of SARS-CoV-2 viral copies this assay can detect is 250 copies / mL. A negative result does not preclude SARS-CoV-2 infection and should not be used as the sole basis for treatment or other patient management decisions.  A negative result may occur with improper specimen collection / handling, submission of specimen other than nasopharyngeal swab, presence of viral mutation(s) within the areas targeted by this assay, and inadequate number of viral copies (<250 copies / mL). A negative result must be combined with clinical observations, patient history, and epidemiological information.  Fact Sheet for Patients:   BoilerBrush.com.cy  Fact Sheet for Healthcare Providers: https://pope.com/  This test is not yet approved or  cleared by the [place] FDA and has been authorized for detection and/or diagnosis of SARS-CoV-2 by FDA under an Emergency Use Authorization (EUA).  This EUA will remain in effect (meaning this test can be used) for the duration of the COVID-19 declaration under Section 564(b)(1) of the Act, 21 U.S.C. section 360bbb-3(b)(1), unless the authorization is terminated or revoked sooner.  Performed at [facility], [address], [place], [place] [address]          Radiology Studies: CT ANGIO HEAD W OR WO CONTRAST  Result Date: 11/01/2019 CLINICAL DATA:  Follow-up stroke. Right basal ganglia infarctions shown by MRI yesterday. EXAM: CT ANGIOGRAPHY HEAD AND NECK TECHNIQUE: Multidetector CT imaging of the head and neck was performed using the standard protocol during bolus administration of intravenous contrast. Multiplanar CT image reconstructions and MIPs were obtained to evaluate the vascular anatomy. Carotid stenosis measurements (when applicable) are obtained utilizing NASCET criteria, using the distal internal carotid diameter as the denominator. CONTRAST:  75mL OMNIPAQUE IOHEXOL 350 MG/ML SOLN COMPARISON:  MRI and CT studies done yesterday. FINDINGS: CT HEAD FINDINGS Brain: Generalized atrophy. Chronic small-vessel ischemic changes throughout the cerebral hemispheric white matter. Old lacunar infarctions of the basal ganglia and thalami. I do not see newly developing low-density related to the acute right basal ganglia infarctions shown on MRI yesterday. No mass effect or hemorrhage. No hydrocephalus or extra-axial collection. Vascular: There is atherosclerotic calcification of the major vessels at the base of the brain. Skull: Negative Sinuses: Clear/normal Orbits: Normal Review of the MIP images confirms the above findings CTA NECK FINDINGS Aortic arch: Aortic atherosclerosis. No brachiocephalic origin stenosis of significance. Right carotid system: Common carotid artery widely patent to the bifurcation. Calcified plaque at the carotid bifurcation and ICA bulb but no stenosis. Cervical ICA widely patent beyond that. Left carotid system: Common carotid artery widely patent to the bifurcation. Calcified plaque at the carotid bifurcation and ICA bulb. No stenosis. Cervical ICA widely patent beyond that. Vertebral arteries: Vertebral artery origins not included. Right vertebral artery patent through  the cervical region to the foramen magnum. Left vertebral artery is a small vessel which appears to be occluded at the skull base level. Skeleton: Cervical spondylosis. Other neck: No mass or lymphadenopathy. Upper chest: Negative Review of the MIP images confirms the above findings CTA HEAD FINDINGS Anterior circulation: Both internal carotid arteries are patent through the skull base and siphon regions. There is ordinary siphon atherosclerotic calcification but without stenosis greater than 30%. The anterior and middle cerebral vessels on the left are patent. On the right, there is embolic occlusion of the supraclinoid ICA at the carotid terminus. This was visible on the MR angiography yesterday. There is some flow visible in the right  ACA and MCA branches. Posterior circulation: Right vertebral artery is patent through the foramen magnum to the basilar. Atherosclerotic plaque at the right vertebral artery V4 segment with stenosis of 30%. No antegrade flow in the left vertebral artery at the foramen magnum. Retrograde flow in the distal left vertebral artery back to PICA. No basilar stenosis. Posterior circulation branch vessels are normal. Primary anterior circulation supply of both posterior cerebral arteries. Venous sinuses: Patent and normal. Anatomic variants: None significant. Review of the MIP images confirms the above findings IMPRESSION: Embolus at the right carotid terminus. Some flow visible in the right and ACA and MCA territories presently, though reduced, particularly in the MCA territory. Probably similar appearance when compared to the MR angiography of yesterday. One could argue that there is more blood flow in the MCA territory presently, but this is difficult to state with certainty comparing the different techniques. Nonstenotic atherosclerotic disease at both carotid bifurcations. No flow in the left vertebral artery at the foramen magnum level. Retrograde flow in the distal left vertebral  artery back to PICA. Findings discussed with Dr. Roda Shutters during image transfer at 1030 hours. Electronically Signed   By: Paulina Fusi M.D.   On: 11/01/2019 10:43   DG Chest 2 View  Result Date: 10/31/2019 CLINICAL DATA:  Code stroke.  CVA. EXAM: CHEST - 2 VIEW COMPARISON:  09/18/2019 FINDINGS: Stable upper normal heart size. Unchanged mediastinal contours with aortic atherosclerosis. No acute or focal airspace disease. No pulmonary edema, pleural effusion, or pneumothorax. Chronic degenerative change of the left shoulder. No acute osseous abnormalities are seen. IMPRESSION: No acute chest findings. Aortic Atherosclerosis (ICD10-I70.0). Electronically Signed   By: Narda Rutherford M.D.   On: 10/31/2019 20:05   CT ANGIO NECK W OR WO CONTRAST  Result Date: 11/01/2019 CLINICAL DATA:  Follow-up stroke. Right basal ganglia infarctions shown by MRI yesterday. EXAM: CT ANGIOGRAPHY HEAD AND NECK TECHNIQUE: Multidetector CT imaging of the head and neck was performed using the standard protocol during bolus administration of intravenous contrast. Multiplanar CT image reconstructions and MIPs were obtained to evaluate the vascular anatomy. Carotid stenosis measurements (when applicable) are obtained utilizing NASCET criteria, using the distal internal carotid diameter as the denominator. CONTRAST:  51mL OMNIPAQUE IOHEXOL 350 MG/ML SOLN COMPARISON:  MRI and CT studies done yesterday. FINDINGS: CT HEAD FINDINGS Brain: Generalized atrophy. Chronic small-vessel ischemic changes throughout the cerebral hemispheric white matter. Old lacunar infarctions of the basal ganglia and thalami. I do not see newly developing low-density related to the acute right basal ganglia infarctions shown on MRI yesterday. No mass effect or hemorrhage. No hydrocephalus or extra-axial collection. Vascular: There is atherosclerotic calcification of the major vessels at the base of the brain. Skull: Negative Sinuses: Clear/normal Orbits: Normal Review  of the MIP images confirms the above findings CTA NECK FINDINGS Aortic arch: Aortic atherosclerosis. No brachiocephalic origin stenosis of significance. Right carotid system: Common carotid artery widely patent to the bifurcation. Calcified plaque at the carotid bifurcation and ICA bulb but no stenosis. Cervical ICA widely patent beyond that. Left carotid system: Common carotid artery widely patent to the bifurcation. Calcified plaque at the carotid bifurcation and ICA bulb. No stenosis. Cervical ICA widely patent beyond that. Vertebral arteries: Vertebral artery origins not included. Right vertebral artery patent through the cervical region to the foramen magnum. Left vertebral artery is a small vessel which appears to be occluded at the skull base level. Skeleton: Cervical spondylosis. Other neck: No mass or lymphadenopathy. Upper chest: Negative Review  of the MIP images confirms the above findings CTA HEAD FINDINGS Anterior circulation: Both internal carotid arteries are patent through the skull base and siphon regions. There is ordinary siphon atherosclerotic calcification but without stenosis greater than 30%. The anterior and middle cerebral vessels on the left are patent. On the right, there is embolic occlusion of the supraclinoid ICA at the carotid terminus. This was visible on the MR angiography yesterday. There is some flow visible in the right ACA and MCA branches. Posterior circulation: Right vertebral artery is patent through the foramen magnum to the basilar. Atherosclerotic plaque at the right vertebral artery V4 segment with stenosis of 30%. No antegrade flow in the left vertebral artery at the foramen magnum. Retrograde flow in the distal left vertebral artery back to PICA. No basilar stenosis. Posterior circulation branch vessels are normal. Primary anterior circulation supply of both posterior cerebral arteries. Venous sinuses: Patent and normal. Anatomic variants: None significant. Review of the  MIP images confirms the above findings IMPRESSION: Embolus at the right carotid terminus. Some flow visible in the right and ACA and MCA territories presently, though reduced, particularly in the MCA territory. Probably similar appearance when compared to the MR angiography of yesterday. One could argue that there is more blood flow in the MCA territory presently, but this is difficult to state with certainty comparing the different techniques. Nonstenotic atherosclerotic disease at both carotid bifurcations. No flow in the left vertebral artery at the foramen magnum level. Retrograde flow in the distal left vertebral artery back to PICA. Findings discussed with Dr. Roda Shutters during image transfer at 1030 hours. Electronically Signed   By: Paulina Fusi M.D.   On: 11/01/2019 10:43   MR ANGIO HEAD WO CONTRAST  Addendum Date: 10/31/2019   ADDENDUM REPORT: 10/31/2019 19:45 ADDENDUM: These results were called by telephone at the time of interpretation on 10/31/2019 at 7:45 pm to provider [facility] , who verbally acknowledged these results. Electronically Signed   By: Jackey Loge DO   On: 10/31/2019 19:45   Result Date: 10/31/2019 CLINICAL DATA:  Stroke, follow-up. EXAM: MRA HEAD WITHOUT CONTRAST TECHNIQUE: Angiographic images of the Circle of Willis were obtained using MRA technique without intravenous contrast. COMPARISON:  Noncontrast brain MRI performed earlier the same day, noncontrast head CT performed earlier the same day FINDINGS: The examination is mild to moderately motion degraded, limiting evaluation for stenoses and for small aneurysms. There is symmetric signal loss within the bilateral internal carotid arteries at the level of the skull base, likely artifactual. There is moderate atherosclerotic narrowing of the cavernous right ICA. There is apparent occlusion of the very distal right ICA terminus. No significant flow-related enhancement is demonstrated within the M1 right middle cerebral artery.  Additionally, there is a paucity of flow related enhancement within the proximal A1 right anterior cerebral artery, which may be occluded. The M1 left middle cerebral artery is patent without significant stenosis. No left M2 proximal branch occlusion is identified. The A1 left anterior cerebral artery and more distal anterior cerebral arteries are patent. No intracranial aneurysm is identified. The non dominant V4 left vertebral artery is poorly delineated proximally, which may be due to small vessel size or high-grade stenosis. Moderate/severe focal stenosis within the dominant V4 right vertebral artery (series 1017, image 10). The posterior cerebral arteries are patent bilaterally without definite high-grade proximal stenosis. The posterior cerebral is are predominantly fetal in origin bilaterally. IMPRESSION: Mild to moderately motion degradation examination, limiting evaluation for stenoses and for small aneurysms.  Apparent occlusion at the level of the very distal right ICA terminus. There is no significant flow related enhancement within the M1 right middle cerebral artery and there is minimal appreciable flow-related enhancement within the M2 and more distal right MCA branches. There is also a paucity of flow related enhancement within the proximal A1 right anterior cerebral artery, possibly occluded. Moderate atherosclerotic narrowing of the cavernous right ICA. Moderate/severe focal stenosis within the dominant V4 right vertebral artery. Attenuated appearance of the proximal V4 left vertebral artery, which may be due to small vessel size or high-grade stenosis. Electronically Signed: By: Jackey Loge DO On: 10/31/2019 19:33   MR BRAIN WO CONTRAST  Result Date: 10/31/2019 CLINICAL DATA:  Neuro deficit, acute, stroke suspected. Additional history obtained from radiology records, slurred speech. EXAM: MRI HEAD WITHOUT CONTRAST TECHNIQUE: Multiplanar, multiecho pulse sequences of the brain and surrounding  structures were obtained without intravenous contrast. COMPARISON:  Noncontrast head CT performed earlier the same day. FINDINGS: Brain: Moderate generalized parenchymal atrophy. 12 mm focus of restricted diffusion within the right caudate head consistent with acute infarction. Additional 16 mm acute infarct within the right lentiform nucleus. No evidence of acute infarct elsewhere within the brain. Redemonstrated chronic lacunar infarcts within the bilateral thalami, left basal ganglia and right cerebellar. Background moderate to advanced patchy T2/FLAIR hyperintensity within the cerebral white matter which is nonspecific, but consistent with chronic small vessel ischemic disease. No evidence of intracranial mass. No chronic intracranial blood products. No extra-axial fluid collection. No midline shift. Vascular: No definite loss of proximal large arterial flow voids. Skull and upper cervical spine: No focal marrow lesion. Sinuses/Orbits: Visualized orbits show no acute finding. No significant paranasal sinus disease or mastoid effusion at the imaged levels. IMPRESSION: Mildly motion degraded examination. 12 mm acute infarct within the right caudate head. 16 mm acute infarct within the right lentiform nucleus. Background moderate generalized parenchymal atrophy and mild-to-moderate chronic small vessel ischemic disease. Chronic lacunar infarcts are present within the bilateral thalami, left basal ganglia and right cerebellum. Electronically Signed   By: Jackey Loge DO   On: 10/31/2019 17:21   ECHOCARDIOGRAM COMPLETE  Result Date: 11/01/2019    ECHOCARDIOGRAM REPORT   Patient Name:   Susan Potter Date of Exam: 11/01/2019 Medical Rec #:  [id number]           Height:       67.0 in Accession #:    [id number]          Weight:       205.0 lb Date of Birth:  [birth year]           BSA:          2.044 m Patient Age:    89 years            BP:           118/75 mmHg Patient Gender: F                   HR:            75 bpm. Exam Location:  Inpatient Procedure: 2D Echo Indications:    stroke 434.91  History:        Patient has prior history of Echocardiogram examinations, most                 recent 08/30/2019. Arrythmias:Atrial Fibrillation; Risk                 Factors:Diabetes, Hypertension and  Dyslipidemia.  Sonographer:    Celene Skeen RDCS (AE) Referring Phys: Gomez Cleverly LAMA IMPRESSIONS  1. Left ventricular ejection fraction, by estimation, is 60 to 65%. The left ventricle has normal function. The left ventricle has no regional wall motion abnormalities. Left ventricular diastolic parameters are indeterminate.  2. Right ventricular systolic function is normal. The right ventricular size is normal. There is normal pulmonary artery systolic pressure.  3. Left atrial size was moderately dilated.  4. The mitral valve is normal in structure. Trivial mitral valve regurgitation. No evidence of mitral stenosis.  5. The aortic valve is tricuspid. Aortic valve regurgitation is mild. No aortic stenosis is present.  6. The inferior vena cava is normal in size with greater than 50% respiratory variability, suggesting right atrial pressure of 3 mmHg. FINDINGS  Left Ventricle: Left ventricular ejection fraction, by estimation, is 60 to 65%. The left ventricle has normal function. The left ventricle has no regional wall motion abnormalities. The left ventricular internal cavity size was normal in size. There is  no left ventricular hypertrophy. Left ventricular diastolic parameters are indeterminate. Right Ventricle: The right ventricular size is normal. No increase in right ventricular wall thickness. Right ventricular systolic function is normal. There is normal pulmonary artery systolic pressure. The tricuspid regurgitant velocity is 2.36 m/s, and  with an assumed right atrial pressure of 3 mmHg, the estimated right ventricular systolic pressure is 25.3 mmHg. Left Atrium: Left atrial size was moderately dilated. Right Atrium: Right  atrial size was normal in size. Pericardium: There is no evidence of pericardial effusion. Mitral Valve: The mitral valve is normal in structure. Normal mobility of the mitral valve leaflets. Trivial mitral valve regurgitation. No evidence of mitral valve stenosis. Tricuspid Valve: The tricuspid valve is normal in structure. Tricuspid valve regurgitation is trivial. No evidence of tricuspid stenosis. Aortic Valve: The aortic valve is tricuspid. . There is moderate thickening and moderate calcification of the aortic valve. Aortic valve regurgitation is mild. No aortic stenosis is present. There is moderate thickening of the aortic valve. There is moderate calcification of the aortic valve. Pulmonic Valve: The pulmonic valve was normal in structure. Pulmonic valve regurgitation is not visualized. No evidence of pulmonic stenosis. Aorta: The aortic root is normal in size and structure. Venous: The inferior vena cava is normal in size with greater than 50% respiratory variability, suggesting right atrial pressure of 3 mmHg. IAS/Shunts: No atrial level shunt detected by color flow Doppler.  LEFT VENTRICLE PLAX 2D LVIDd:         3.70 cm LVIDs:         2.20 cm LV PW:         1.00 cm LV IVS:        0.90 cm LVOT diam:     1.90 cm LV SV:         61 LV SV Index:   30 LVOT Area:     2.84 cm  LEFT ATRIUM             Index LA diam:        4.10 cm 2.01 cm/m LA Vol (A2C):   68.5 ml 33.52 ml/m LA Vol (A4C):   75.5 ml 36.94 ml/m LA Biplane Vol: 74.3 ml 36.36 ml/m  AORTIC VALVE LVOT Vmax:   104.00 cm/s LVOT Vmean:  85.300 cm/s LVOT VTI:    0.215 m  AORTA Ao Root diam: 3.30 cm MITRAL VALVE  TRICUSPID VALVE MV Area (PHT): 3.03 cm     TR Peak grad:   22.3 mmHg MV Decel Time: 250 msec     TR Vmax:        236.00 cm/s MV E velocity: 118.00 cm/s                             SHUNTS                             Systemic VTI:  0.22 m                             Systemic Diam: 1.90 cm Chilton Siiffany [place] MD Electronically signed  by Chilton Siiffany [address] [facility] and C MD Signature Date/Time: 11/01/2019/1:43:58 PM    Final    CT HEAD CODE STROKE WO CONTRAST  Result Date: 10/31/2019 CLINICAL DATA:  Code stroke. Slurred speech. Last seen normal 5.5 hours ago. EXAM: CT HEAD WITHOUT CONTRAST TECHNIQUE: Contiguous axial images were obtained from the base of the skull through the vertex without intravenous contrast. COMPARISON:  CT head without contrast 09/18/2019. FINDINGS: Brain: Advanced atrophy and white matter disease is similar the prior study. By lacunar infarcts of the thalami bilaterally are stable. Remote lacunar infarct in the left caudate head is stable. Basal ganglia are otherwise unremarkable. The insular ribbon is normal bilaterally. No acute cortical abnormalities are present. The ventricles are proportionate to the degree of atrophy. No significant extraaxial fluid collection is present. A remote lacunar infarct is present in the posterior right cerebellum. Vascular: Atherosclerotic calcifications are present within the cavernous internal carotid arteries bilaterally. Calcification is present at the dural margin of both vertebral arteries. No hyperdense vessel is present Skull: Calvarium is intact. No focal lytic or blastic lesions are present. No significant extracranial soft tissue lesion is present. Sinuses/Orbits: The paranasal sinuses and mastoid air cells are clear. Left lens replacement is present. Globes and orbits are otherwise within normal limits. ASPECTS [facility]([place] Stroke Program Early CT Score) - Ganglionic level infarction (caudate, lentiform nuclei, internal capsule, insula, M1-M3 cortex): 7/7 - Supraganglionic infarction (M4-M6 cortex): 3/3 Total score (0-10 with 10 being normal): 10/10 IMPRESSION: 1. No acute intracranial abnormality or significant interval change. 2. Stable advanced atrophy and white matter disease. 3. Stable remote lacunar infarcts of the thalami bilaterally, left caudate head, and posterior right cerebellum. The  above was relayed via text pager to Dr. Wilford CornerARORA on 10/31/2019 at 13:51 . Electronically Signed   By: Marin Robertshristopher  Mattern M.D.   On: 10/31/2019 13:51        Scheduled Meds: . atorvastatin  10 mg Oral Daily  . insulin aspart  0-9 Units Subcutaneous TID WC  . raloxifene  60 mg Oral Daily  . warfarin  4 mg Oral Once  . Warfarin - Pharmacist Dosing Inpatient   Does not apply q1600   Continuous Infusions: . sodium chloride Stopped (11/01/19 1237)  . dextrose 5 % and 0.9% NaCl 100 mL/hr at 11/01/19 1232  . heparin 1,200 Units/hr (11/01/19 1237)     LOS: 1 day     Alwyn RenElizabeth G Tylin Stradley, MD 11/01/2019, 2:26 PM

## 2019-11-01 NOTE — ED Notes (Signed)
Dr. Jerolyn Center again paged. No answer after first page.

## 2019-11-01 NOTE — ED Notes (Signed)
Paging Dr. Jerolyn Center in regards to hypoglycemia

## 2019-11-01 NOTE — ED Notes (Signed)
Attempted to call report

## 2019-11-01 NOTE — Progress Notes (Addendum)
ANTICOAGULATION CONSULT NOTE  Pharmacy Consult for Heparin >> Apixaban Once Passes Swallow Test Indication: atrial fibrillation and stroke  No Known Allergies  Patient Measurements: Height: 5\' 7"  (170.2 cm) Weight: 93 kg (205 lb) IBW/kg (Calculated) : 61.6 Heparin Dosing Weight:  81.8 kg  Vital Signs: Temp: 98.6 F (37 C) (07/14 1816) Temp Source: Axillary (07/14 1816) BP: 116/58 (07/14 1816) Pulse Rate: 77 (07/14 1816)  Labs: Recent Labs    10/31/19 1338 10/31/19 1338 10/31/19 1342 11/01/19 0327 11/01/19 1049 11/01/19 2110  HGB 11.4*   < > 11.9* 10.3*  --   --   HCT 35.8*  --  35.0* 33.4*  --   --   PLT 271  --   --  252  --   --   APTT 26  --   --   --   --   --   LABPROT 15.7*  --   --  20.0*  --   --   INR 1.3*  --   --  1.8*  --   --   HEPARINUNFRC  --   --   --   --  0.28* 0.75*  CREATININE 1.18*  --  1.20*  --   --   --    < > = values in this interval not displayed.    Estimated Creatinine Clearance: 35.8 mL/min (A) (by C-G formula based on SCr of 1.2 mg/dL (H)).   Medical History: Past Medical History:  Diagnosis Date  . AF (paroxysmal atrial fibrillation) (HCC) 08/29/2019  . Atrial fibrillation (HCC)   . Diabetes mellitus without complication (HCC)   . Hypertension   . Mixed diabetic hyperlipidemia associated with type 2 diabetes mellitus (HCC) 08/29/2019  . Osteoporosis   . Retinal vein occlusion   . Type 2 diabetes mellitus without complication, without long-term current use of insulin (HCC) 08/29/2019  . Vitamin D deficiency     Assessment: 86 yr old female on warfarin at home for afib. The patient presented to the ED as a code stroke, but was outside the TPA window. The patient was found to have two acute strokes on MRI. Warfarin was  cancelled, due to failed swallow study, and pt was switched to IV heparin.  Heparin level this evening ~9 hrs after heparin infusion was increased to 1200 units/hr was 0.75 units/ml, which is above the goal range  for this pt. H/H 10.3/33.4, platelets 252 (CBC trending down); INR 1.8 today. Per RN, no issues with IV or bleeding observed.  Pharmacy is consulted to transition IV heparin to apixaban, once pt passes swallow test. Per RN, pt has not yet passed swallow test - next attempt will be tomorrow. Scr 1.20, 86 yrs old, 93 kg.  Goal of Therapy:  Heparin level 0.3-0.5 units/ml Monitor platelets by anticoagulation protocol: Yes   Plan:  Reduce heparin infusion to 1000 units/hr F/U 8 hour heparin level Monitor daily heparin level, CBC Monitor for signs/symptoms of bleeding Transition from IV heparin to apixaban after pt passes swallow test  03-15-1999, PharmD, BCPS, [facility] Clinical Pharmacist 11/01/2019 6:28 PM

## 2019-11-01 NOTE — ED Notes (Signed)
Dr. Jerolyn Center called back, new orders placed.

## 2019-11-01 NOTE — Plan of Care (Signed)
Late entry note  Spoke to patient's daughter late last night.  Explained the MRI findings of stroke.  Also explained the MRI findings of right ICA occlusion. Poor candidate for intervention due to poor baseline modified Rankin advanced age. I discussed the case with the interventionalist after MRI was made available, who also agrees that she would not be a good candidate for any intervention  Milon Dikes MD

## 2019-11-01 NOTE — ED Notes (Signed)
ECHO at bedside.

## 2019-11-01 NOTE — Progress Notes (Signed)
  Echocardiogram 2D Echocardiogram has been performed.  Susan Potter 11/01/2019, 10:52 AM

## 2019-11-01 NOTE — ED Notes (Signed)
Susan Potter, daughter, going home & requesting updates as needed. [phone]

## 2019-11-01 NOTE — Progress Notes (Signed)
ANTICOAGULATION CONSULT NOTE  Pharmacy Consult for Heparin Indication: atrial fibrillation and stroke  No Known Allergies  Patient Measurements: Height: 5\' 7"  (170.2 cm) Weight: 93 kg (205 lb) IBW/kg (Calculated) : 61.6 Heparin Dosing Weight:   Vital Signs: BP: 118/75 (07/14 0900) Pulse Rate: 75 (07/14 0900)  Labs: Recent Labs    10/31/19 1338 10/31/19 1338 10/31/19 1342 11/01/19 0327 11/01/19 1049  HGB 11.4*   < > 11.9* 10.3*  --   HCT 35.8*  --  35.0* 33.4*  --   PLT 271  --   --  252  --   APTT 26  --   --   --   --   LABPROT 15.7*  --   --  20.0*  --   INR 1.3*  --   --  1.8*  --   HEPARINUNFRC  --   --   --   --  0.28*  CREATININE 1.18*  --  1.20*  --   --    < > = values in this interval not displayed.    Estimated Creatinine Clearance: 35.8 mL/min (A) (by C-G formula based on SCr of 1.2 mg/dL (H)).   Medical History: Past Medical History:  Diagnosis Date  . AF (paroxysmal atrial fibrillation) (HCC) 08/29/2019  . Atrial fibrillation (HCC)   . Diabetes mellitus without complication (HCC)   . Hypertension   . Mixed diabetic hyperlipidemia associated with type 2 diabetes mellitus (HCC) 08/29/2019  . Osteoporosis   . Retinal vein occlusion   . Type 2 diabetes mellitus without complication, without long-term current use of insulin (HCC) 08/29/2019  . Vitamin D deficiency     Medications:  Scheduled:  . atorvastatin  10 mg Oral Daily  . insulin aspart  0-9 Units Subcutaneous TID WC  . insulin glargine  10 Units Subcutaneous QHS  . raloxifene  60 mg Oral Daily  . warfarin  4 mg Oral Once  . Warfarin - Pharmacist Dosing Inpatient   Does not apply q1600    Assessment: Patient is a 49 yof that is on warfarin at home for afib. The patient present today as a code stroke however she was outside the TPA window. The patient was found to have two acute strokes on MRI. Warfarin cancelled d/t failed swallow study, switched to heparin gtt.  Initial heparin level  slightly subtherapeutic at 0.28  Goal of Therapy:  Heparin level 0.3-0.5 units/ml Monitor platelets by anticoagulation protocol: Yes   Plan:  Increase heparin gtt to 1200 units/hr F/u 8 hour heparin level  82, PharmD Clinical Pharmacist ED Pharmacist Phone # [phone] 11/01/2019 12:15 PM

## 2019-11-01 NOTE — Progress Notes (Addendum)
STROKE TEAM PROGRESS NOTE   INTERVAL HISTORY No family at bedside initially, however later her daughter arrived.  Patient still has right gaze preference and left sided hemiparesis with left facial droop.  However, her right lower extremity also weak due to recent fracture.  Vitals:   11/01/19 0030 11/01/19 0045 11/01/19 0230 11/01/19 0445  BP: (!) 110/57 (!) 116/51 127/61 (!) 116/58  Pulse: 71 77 78 82  Resp: 14 15 20  (!) 21  Temp:      TempSrc:      SpO2: 93% 95% 94% 93%  Weight:      Height:       CBC:  Recent Labs  Lab 10/31/19 1338 10/31/19 1338 10/31/19 1342 11/01/19 0327  WBC 11.9*  --   --  9.0  NEUTROABS 9.1*  --   --   --   HGB 11.4*   < > 11.9* 10.3*  HCT 35.8*   < > 35.0* 33.4*  MCV 92.3  --   --  89.8  PLT 271  --   --  252   < > = values in this interval not displayed.   Basic Metabolic Panel:  Recent Labs  Lab 10/31/19 1338 10/31/19 1342  NA 138 138  K 4.4 4.3  CL 99 98  CO2 27  --   GLUCOSE 119* 113*  BUN 14 17  CREATININE 1.18* 1.20*  CALCIUM 9.1  --    Lipid Panel:  Recent Labs  Lab 11/01/19 0327  CHOL 106  TRIG 81  HDL 37*  CHOLHDL 2.9  VLDL 16  LDLCALC 53   HgbA1c:  Recent Labs  Lab 11/01/19 0327  HGBA1C 6.1*   Urine Drug Screen:  Recent Labs  Lab 10/31/19 1930  LABOPIA NONE DETECTED  COCAINSCRNUR NONE DETECTED  LABBENZ NONE DETECTED  AMPHETMU NONE DETECTED  THCU NONE DETECTED  LABBARB NONE DETECTED    Alcohol Level  Recent Labs  Lab 10/31/19 1338  ETH <10    IMAGING past 24 hours DG Chest 2 View  Result Date: 10/31/2019 CLINICAL DATA:  Code stroke.  CVA. EXAM: CHEST - 2 VIEW COMPARISON:  09/18/2019 FINDINGS: Stable upper normal heart size. Unchanged mediastinal contours with aortic atherosclerosis. No acute or focal airspace disease. No pulmonary edema, pleural effusion, or pneumothorax. Chronic degenerative change of the left shoulder. No acute osseous abnormalities are seen. IMPRESSION: No acute chest findings.  Aortic Atherosclerosis (ICD10-I70.0). Electronically Signed   By: 09/20/2019 M.D.   On: 10/31/2019 20:05   MR ANGIO HEAD WO CONTRAST  Addendum Date: 10/31/2019   ADDENDUM REPORT: 10/31/2019 19:45 ADDENDUM: These results were called by telephone at the time of interpretation on 10/31/2019 at 7:45 pm to provider [facility] , who verbally acknowledged these results. Electronically Signed   By: [facility] DO   On: 10/31/2019 19:45   Result Date: 10/31/2019 CLINICAL DATA:  Stroke, follow-up. EXAM: MRA HEAD WITHOUT CONTRAST TECHNIQUE: Angiographic images of the Circle of Willis were obtained using MRA technique without intravenous contrast. COMPARISON:  Noncontrast brain MRI performed earlier the same day, noncontrast head CT performed earlier the same day FINDINGS: The examination is mild to moderately motion degraded, limiting evaluation for stenoses and for small aneurysms. There is symmetric signal loss within the bilateral internal carotid arteries at the level of the skull base, likely artifactual. There is moderate atherosclerotic narrowing of the cavernous right ICA. There is apparent occlusion of the very distal right ICA terminus. No significant flow-related enhancement is  demonstrated within the M1 right middle cerebral artery. Additionally, there is a paucity of flow related enhancement within the proximal A1 right anterior cerebral artery, which may be occluded. The M1 left middle cerebral artery is patent without significant stenosis. No left M2 proximal branch occlusion is identified. The A1 left anterior cerebral artery and more distal anterior cerebral arteries are patent. No intracranial aneurysm is identified. The non dominant V4 left vertebral artery is poorly delineated proximally, which may be due to small vessel size or high-grade stenosis. Moderate/severe focal stenosis within the dominant V4 right vertebral artery (series 1017, image 10). The posterior cerebral arteries are patent  bilaterally without definite high-grade proximal stenosis. The posterior cerebral is are predominantly fetal in origin bilaterally. IMPRESSION: Mild to moderately motion degradation examination, limiting evaluation for stenoses and for small aneurysms. Apparent occlusion at the level of the very distal right ICA terminus. There is no significant flow related enhancement within the M1 right middle cerebral artery and there is minimal appreciable flow-related enhancement within the M2 and more distal right MCA branches. There is also a paucity of flow related enhancement within the proximal A1 right anterior cerebral artery, possibly occluded. Moderate atherosclerotic narrowing of the cavernous right ICA. Moderate/severe focal stenosis within the dominant V4 right vertebral artery. Attenuated appearance of the proximal V4 left vertebral artery, which may be due to small vessel size or high-grade stenosis. Electronically Signed: By: Jackey LogeKyle  Golden DO On: 10/31/2019 19:33   MR BRAIN WO CONTRAST  Result Date: 10/31/2019 CLINICAL DATA:  Neuro deficit, acute, stroke suspected. Additional history obtained from radiology records, slurred speech. EXAM: MRI HEAD WITHOUT CONTRAST TECHNIQUE: Multiplanar, multiecho pulse sequences of the brain and surrounding structures were obtained without intravenous contrast. COMPARISON:  Noncontrast head CT performed earlier the same day. FINDINGS: Brain: Moderate generalized parenchymal atrophy. 12 mm focus of restricted diffusion within the right caudate head consistent with acute infarction. Additional 16 mm acute infarct within the right lentiform nucleus. No evidence of acute infarct elsewhere within the brain. Redemonstrated chronic lacunar infarcts within the bilateral thalami, left basal ganglia and right cerebellar. Background moderate to advanced patchy T2/FLAIR hyperintensity within the cerebral white matter which is nonspecific, but consistent with chronic small vessel ischemic  disease. No evidence of intracranial mass. No chronic intracranial blood products. No extra-axial fluid collection. No midline shift. Vascular: No definite loss of proximal large arterial flow voids. Skull and upper cervical spine: No focal marrow lesion. Sinuses/Orbits: Visualized orbits show no acute finding. No significant paranasal sinus disease or mastoid effusion at the imaged levels. IMPRESSION: Mildly motion degraded examination. 12 mm acute infarct within the right caudate head. 16 mm acute infarct within the right lentiform nucleus. Background moderate generalized parenchymal atrophy and mild-to-moderate chronic small vessel ischemic disease. Chronic lacunar infarcts are present within the bilateral thalami, left basal ganglia and right cerebellum. Electronically Signed   By: Jackey LogeKyle  Golden DO   On: 10/31/2019 17:21   CT HEAD CODE STROKE WO CONTRAST  Result Date: 10/31/2019 CLINICAL DATA:  Code stroke. Slurred speech. Last seen normal 5.5 hours ago. EXAM: CT HEAD WITHOUT CONTRAST TECHNIQUE: Contiguous axial images were obtained from the base of the skull through the vertex without intravenous contrast. COMPARISON:  CT head without contrast 09/18/2019. FINDINGS: Brain: Advanced atrophy and white matter disease is similar the prior study. By lacunar infarcts of the thalami bilaterally are stable. Remote lacunar infarct in the left caudate head is stable. Basal ganglia are otherwise unremarkable. The insular ribbon is  normal bilaterally. No acute cortical abnormalities are present. The ventricles are proportionate to the degree of atrophy. No significant extraaxial fluid collection is present. A remote lacunar infarct is present in the posterior right cerebellum. Vascular: Atherosclerotic calcifications are present within the cavernous internal carotid arteries bilaterally. Calcification is present at the dural margin of both vertebral arteries. No hyperdense vessel is present Skull: Calvarium is intact. No  focal lytic or blastic lesions are present. No significant extracranial soft tissue lesion is present. Sinuses/Orbits: The paranasal sinuses and mastoid air cells are clear. Left lens replacement is present. Globes and orbits are otherwise within normal limits. ASPECTS [facility] Stroke Program Early CT Score) - Ganglionic level infarction (caudate, lentiform nuclei, internal capsule, insula, M1-M3 cortex): 7/7 - Supraganglionic infarction (M4-M6 cortex): 3/3 Total score (0-10 with 10 being normal): 10/10 IMPRESSION: 1. No acute intracranial abnormality or significant interval change. 2. Stable advanced atrophy and white matter disease. 3. Stable remote lacunar infarcts of the thalami bilaterally, left caudate head, and posterior right cerebellum. The above was relayed via text pager to Dr. Wilford Corner on 10/31/2019 at 13:51 . Electronically Signed   By: Marin Roberts M.D.   On: 10/31/2019 13:51    PHYSICAL EXAM  Pulse Rate:  [70-82] 73 (07/14 1230) Resp:  [14-22] 16 (07/14 1230) BP: (109-127)/(51-75) 118/75 (07/14 0900) SpO2:  [93 %-98 %] 98 % (07/14 1230)  General - Well nourished, well developed, lethargic.  Ophthalmologic - fundi not visualized due to noncooperation.  Cardiovascular - irregularly irregular heart rate and rhythm.  Neuro - lethargic, eyes closed, however able to arouse with voice.  Orientated to self, but not age, place or time.  Moderate to severe dysarthria.  Not corporative on naming or repeating.  Right gaze complete, left gaze incomplete.  However able to tracking bilaterally.  Blinking to visual threat bilaterally.  PERRL.  Left facial droop.  Right upper extremity 3+/5 at least.  Left upper extremity 3 -/5 with drift.  Left lower extremity 3 -/5 with pain stimulation, right lower extremity 2/5 with painful stimulation which has been her baseline for the last couple months due to fracture. Sensation, coordination not corporative and gait not tested.   ASSESSMENT/PLAN Ms.  Susan Potter is a 86 y.o. female with history of paroxysmal atrial fibrillation on Coumadin, diabetes, hypertension, retinal vein occlusion, currently in a SNF s/p RLE tibial plateau fracture with recent admission for altered mental status likely secondary to UTI versus worsening dementia, brought in for concerns for worsening slurred speech. Found to have a RM1 occlusion but not an IR candidate given high mRS and overall poor health  Stroke:   R caudate head and R lentiform nucleus infarcts embolic with R tICA, MCA and ACA occlusion secondary to known AF w/ subtherapeutic warfarin (INR 1.3)  Code Stroke CT head No acute abnormality. Old B thalamic, L caudate head, posterior R cerebellar infarcts. ASPECTS 10.     MRI  R caudate head infarct. R lentiform nucleus infarct. Small vessel disease. Atrophy. Old infarcts as above.  MRA  Distal R ICA terminus occlusion, right M1 occlusion wheeze minimal M2 reconstitution, paucity of blood flow right A1, B V4 moderate/severe focal stenosis   CTA head & neck R ICA terminus embolus w/ some flow in R ACA and MCA territories. B ICA bifurcation atherosclerosis. L VA no flow. Retrograde flow L VA back to PICA.   2D Echo EF 60-65%. No source of embolus. LA moderate dilated.  LDL 53  HgbA1c 6.1  VTE prophylaxis - IV heparin / warfarin  warfarin daily prior to admission w/ INR 1.3 on arrival, now on heparin IV and warfarin daily but pt NPO, INR today 1.8. discussed with pt daughters Susan Potter and Susan Potter, as well as Dr. Jerolyn Center, will transition to eliquis once pt has po access.    Therapy recommendations:  pending   Disposition:  pending (admission from SNF for RLE tibial plateau fracture)  Atrial Fibrillation  Home anticoagulation:  warfarin daily   INR on arrival 1.3  Warfarin continued in the hospital along w/ IV heparin although pt is NPO  Last INR 1.8  Discussed with pt daughters Susan Potter and Susan Potter, as well as Dr. Jerolyn Center, will transition to  eliquis once pt has po access.    Right ICA occlusion  MRA and CTA confirmed right ICA terminus occlusion  Pt not candidate for any intervention at this time  Will do permissive hypertension  Avoid low BP  Long term BP goal 130-150.   Hypertension  Stable . Permissive hypertension (OK if < 180/105) but gradually normalize in 5-7 days . Long-term BP goal 130-150 given right ICA occlusion  Hyperlipidemia  Home meds:  lipitor 10, resumed in hospital  LDL 53, goal < 70  Continue moderate intensity statin given LDL < 70  Continue statin at discharge  Diabetes type II Controlled hypoglycemia  HgbA1c 6.1, goal < 7.0  CBGs  SSI  Glucose 64->82->88  Put on D5NS @ 100  Dysphagia . Secondary to stroke . NPO . Speech on board . On IVF   UTI  UA WBC > 50  Urine culture pending  Put on rocephin  Other Stroke Risk Factors  Advanced age  Obesity, Body mass index is 32.11 kg/m., recommend weight loss, diet and exercise as appropriate   Other Active Problems  Hx retinal vein occlusion   RLE tibial plateau fracture in SNF for rehab  Hospital day # 1  I had long discussion with daughters over the phone, updated pt current condition, treatment plan and potential prognosis, discussed medication change and answered all the questions. They expressed understanding and appreciation.   Marvel Plan, MD PhD Stroke Neurology 11/01/2019 6:15 PM  Patient condition worsened within the last 24 hours, has developed right brain stroke and right ICA occlusion, continues to have left hemiparesis, also found to have UTI and I discussed with family regarding medication changes, plan to switch Coumadin to Eliquis, I also ordered a urine culture and antibiotics. I discussed with Dr. Ashley Royalty. I spent  35 minutes in total face-to-face time with the patient, more than 50% of which was spent in counseling and coordination of care, reviewing test results, images and medication, and  discussing the diagnosis, treatment plan and potential prognosis. This patient's care requiresreview of multiple databases, neurological assessment, discussion with family, other specialists and medical decision making of high complexity.  To contact Stroke Continuity provider, please refer to [email or web address]. After hours, contact General Neurology

## 2019-11-02 LAB — CBC
HCT: 35.2 % — ABNORMAL LOW (ref 36.0–46.0)
Hemoglobin: 10.8 g/dL — ABNORMAL LOW (ref 12.0–15.0)
MCH: 28 pg (ref 26.0–34.0)
MCHC: 30.7 g/dL (ref 30.0–36.0)
MCV: 91.2 fL (ref 80.0–100.0)
Platelets: 237 10*3/uL (ref 150–400)
RBC: 3.86 MIL/uL — ABNORMAL LOW (ref 3.87–5.11)
RDW: 15.5 % (ref 11.5–15.5)
WBC: 9.2 10*3/uL (ref 4.0–10.5)
nRBC: 0 % (ref 0.0–0.2)

## 2019-11-02 LAB — GLUCOSE, CAPILLARY
Glucose-Capillary: 136 mg/dL — ABNORMAL HIGH (ref 70–99)
Glucose-Capillary: 194 mg/dL — ABNORMAL HIGH (ref 70–99)
Glucose-Capillary: 154 mg/dL — ABNORMAL HIGH (ref 70–99)
Glucose-Capillary: 114 mg/dL — ABNORMAL HIGH (ref 70–99)

## 2019-11-02 LAB — PROTIME-INR
INR: 1.4 — ABNORMAL HIGH (ref 0.8–1.2)
Prothrombin Time: 16.1 seconds — ABNORMAL HIGH (ref 11.4–15.2)

## 2019-11-02 LAB — HEPARIN LEVEL (UNFRACTIONATED): Heparin Unfractionated: 0.56 IU/mL (ref 0.30–0.70)

## 2019-11-02 MED ORDER — INSULIN GLARGINE 100 UNIT/ML ~~LOC~~ SOLN
5.0000 [IU] | Freq: Every day | SUBCUTANEOUS | Status: DC
  Administered 2019-11-02: 5 [IU] via SUBCUTANEOUS
  Filled 2019-11-02 (×3): qty 0.05

## 2019-11-02 MED ORDER — APIXABAN 5 MG PO TABS
5.0000 mg | ORAL_TABLET | Freq: Two times a day (BID) | ORAL | Status: DC
  Administered 2019-11-02 – 2019-11-03 (×3): 5 mg via ORAL
  Filled 2019-11-02 (×3): qty 1

## 2019-11-02 NOTE — Evaluation (Signed)
Occupational Therapy Evaluation Patient Details Name: Susan Potter MRN: [id number] DOB: [birth year] Today's Date: 11/02/2019    History of Present Illness Pt is a 86 y.o. female with PMH of HTN, a fib, Typle 2 DM, osteoporosis, R tibial plateau fx, AMS, and hx of falls presenting to ED from SNF with difficulty with speech and weakness. MRI brain showed acute infarcts within the right caudate head and right lentiform nucleus.   Clinical Impression   PTA pt's daughter reports pt was at [facility] continuing rehab after tibial fx needing +2 assist with mobility with walker and assist with ADLs. Pt was admitted for above and treated for problem list below (see OT Problem List). Pt is A&Ox1 disoriented to place, time, and situation with history of dementia - Limited historian. Requires Min A to Total A +2 ADLs with multimodal cues for sequencing and initiation due to decreased cognition, weakness, deficits in coordination, and vision. Requires Max A +2 with sit<>stand to complete semi-stand (unable to reach full stand), lateral scoots to [facility] and bed mobility. Difficult to formally assess vision due to cognition. During functional activity noted preference for R eye gaze and gaze stopping at midline - not able to look to the left. Noted decreased L UE ROM and strength consistent with stroke symptoms. Believe pt would benefit from skilled OT services acutely and at the SNF level to increase participation in ADLs and return to PLOF.    Follow Up Recommendations  SNF;Supervision/Assistance - 24 hour    Equipment Recommendations  Other (comment) (TBD at next venue of care)       Precautions / Restrictions Precautions Precautions: Fall Restrictions Weight Bearing Restrictions: No Other Position/Activity Restrictions: No current restrictions RLE. s/p 10 weeks from tib plat fx      Mobility Bed Mobility Overal bed mobility: Needs Assistance Bed Mobility: Rolling;Sidelying to Sit;Sit to  Supine Rolling: Max assist (using bed rail) Sidelying to sit: Max assist   Sit to supine: Max assist   General bed mobility comments: Pt needing max A with cueing and for lowering/elevating legs   Transfers Overall transfer level: Needs assistance Equipment used: 2 person hand held assist;Rolling walker (2 wheeled) Transfers: Sit to/from Stand;Lateral/Scoot Transfers Sit to Stand: Max assist;+2 physical assistance        Lateral/Scoot Transfers: Max assist;+2 physical assistance;+2 safety/equipment General transfer comment: Max A +2 for lateral scoot to [facility] and Max A +2 with walker and 2 person HHA with semi-stand    Balance Overall balance assessment: Needs assistance Sitting-balance support: Bilateral upper extremity supported;Feet supported Sitting balance-Leahy Scale: Poor   Postural control: Right lateral lean Standing balance support: Bilateral upper extremity supported;During functional activity Standing balance-Leahy Scale: Zero Standing balance comment: patient requiring BUE support                           ADL either performed or assessed with clinical judgement   ADL Overall ADL's : Needs assistance/impaired Eating/Feeding: NPO   Grooming: Cueing for sequencing;Sitting;Minimal assistance Grooming Details (indicate cue type and reason): Min A for balance and cues for sequencing Upper Body Bathing: Moderate assistance;Sitting;Cueing for sequencing Upper Body Bathing Details (indicate cue type and reason): Mod A in sitting with decreased coordination in strength in L UE Lower Body Bathing: Total assistance;Bed level Lower Body Bathing Details (indicate cue type and reason): unsuccessful sit<>stand from bed Upper Body Dressing : Moderate assistance;Sitting;Cueing for sequencing;Cueing for compensatory techniques Upper Body Dressing Details (indicate cue type  and reason): Mod A with max cues for sequencing and dressing with L side affected Lower Body  Dressing: Total assistance;Bed level Lower Body Dressing Details (indicate cue type and reason): Total A due to unsuccessful sit<>stand   Toilet Transfer Details (indicate cue type and reason): deferred due to pt safety Toileting- Clothing Manipulation and Hygiene: Total assistance;Bed level     Tub/Shower Transfer Details (indicate cue type and reason): deferred due to pt safety Functional mobility during ADLs: Total assistance;+2 for safety/equipment;+2 for physical assistance General ADL Comments: Pt tolerating bed level activity limited due to increased lethargy     Vision   Vision Assessment?: Vision impaired- to be further tested in functional context Additional Comments: difficult to formally assess vision due to cognition. During functional activity noted preference for R eye gaze and gaze stopping at midline - not able to look to the left            Pertinent Vitals/Pain Pain Assessment: Faces Faces Pain Scale: No hurt     Hand Dominance Right   Extremity/Trunk Assessment Upper Extremity Assessment Upper Extremity Assessment: Defer to OT evaluation LUE Deficits / Details: Noted decreased strength and ROM in L UE (2/5 shoulder flexion and 3/5 grip strength). LUE Sensation:  (unsure due to cognition) LUE Coordination: decreased gross motor;decreased fine motor   Lower Extremity Assessment Lower Extremity Assessment: RLE deficits/detail;LLE deficits/detail RLE Deficits / Details: limited ROM at knee (relating to recent tib plat fx) LLE Deficits / Details: increased tone noted in hip/knee flexors, minimal active movement noted. Pt not following commands for MMT.   Cervical / Trunk Assessment Cervical / Trunk Assessment: Kyphotic   Communication Communication Communication: HOH   Cognition Arousal/Alertness: Lethargic Behavior During Therapy: Flat affect Overall Cognitive Status: Impaired/Different from baseline Area of Impairment: Memory;Problem  solving;Orientation;Attention;Following commands;Safety/judgement;Awareness                 Orientation Level: Disoriented to;Situation;Time Current Attention Level: Focused Memory: Decreased short-term memory;Decreased recall of precautions Following Commands: Follows one step commands inconsistently;Follows one step commands with increased time Safety/Judgement: Decreased awareness of deficits Awareness: Intellectual Problem Solving: Slow processing;Decreased initiation;Requires verbal cues;Requires tactile cues General Comments: slow processing and inconsistently following commands   General Comments  Pt very lethargic during session            Home Living Family/patient expects to be discharged to:: Skilled nursing facility Living Arrangements: Alone;Children   Type of Home: House                           Additional Comments: Was at [facility] s/p dc from fx. Pt's daughter would like pt to return to pt's home from SNF to live with family with 24/7 assist      Prior Functioning/Environment Level of Independence: Needs assistance  Gait / Transfers Assistance Needed: Was working with therapy at SNF +2 for transfers per pt's daughter ADL's / Homemaking Assistance Needed: Total A for ADLs   Comments: Pt's daughter present during beginning of eval to confirm PLOF.        OT Problem List: Decreased strength;Decreased range of motion;Decreased activity tolerance;Impaired balance (sitting and/or standing);Decreased coordination;Decreased cognition;Decreased knowledge of use of DME or AE;Decreased safety awareness;Decreased knowledge of precautions;Impaired UE functional use;Impaired vision/perception      OT Treatment/Interventions: Self-care/ADL training;Energy conservation;DME and/or AE instruction;Therapeutic activities;Cognitive remediation/compensation;Patient/family education;Balance training;Visual/perceptual remediation/compensation;Neuromuscular  education;Therapeutic exercise    OT Goals(Current goals can be found in the care plan section)  Acute Rehab OT Goals Patient Stated Goal: return to SNF and then home OT Goal Formulation: With patient/family Time For Goal Achievement: 11/16/19 Potential to Achieve Goals: Good  OT Frequency: Min 2X/week           Co-evaluation PT/OT/SLP Co-Evaluation/Treatment: Yes Reason for Co-Treatment: Complexity of the patient's impairments (multi-system involvement);For patient/therapist safety;To address functional/ADL transfers;Necessary to address cognition/behavior during functional activity PT goals addressed during session: Mobility/safety with mobility;Balance OT goals addressed during session: ADL's and self-care;Strengthening/ROM      AM-PAC OT "6 Clicks" Daily Activity     Outcome Measure Help from another person eating meals?:  A Lot Help from another person taking care of personal grooming?: A Little Help from another person toileting, which includes using toliet, bedpan, or urinal?: Total Help from another person bathing (including washing, rinsing, drying)?: A Lot Help from another person to put on and taking off regular upper body clothing?: A Lot Help from another person to put on and taking off regular lower body clothing?: Total 6 Click Score: 11   End of Session Equipment Utilized During Treatment: Gait belt;Rolling walker Nurse Communication: Mobility status  Activity Tolerance: Patient limited by lethargy Patient left: in bed;with call bell/phone within reach;with bed alarm set  OT Visit Diagnosis: Other abnormalities of gait and mobility (R26.89);Muscle weakness (generalized) (M62.81);Other symptoms and signs involving cognitive function;Other symptoms and signs involving the nervous system (R29.898);Hemiplegia and hemiparesis Hemiplegia - Right/Left: Left Hemiplegia - dominant/non-dominant: Non-Dominant Hemiplegia - caused by: Cerebral infarction                Time:  1045-1105 OT Time Calculation (min): 20 min Charges:  OT General Charges $OT Visit: 1 Visit OT Evaluation $OT Eval Moderate Complexity: 1 Mod  Zakayla Martinec/OTS  Cydni Reddoch 11/02/2019, 12:51 PM

## 2019-11-02 NOTE — Evaluation (Signed)
Clinical/Bedside Swallow Evaluation Patient Details  Name: Susan Potter MRN: [id number] Date of Birth: 11-22-1926  Today's Date: 11/02/2019 Time: SLP Start Time (ACUTE ONLY): 0913 SLP Stop Time (ACUTE ONLY): 0927 SLP Time Calculation (min) (ACUTE ONLY): 14 min  Past Medical History:  Past Medical History:  Diagnosis Date  . AF (paroxysmal atrial fibrillation) (HCC) 08/29/2019  . Atrial fibrillation (HCC)   . Diabetes mellitus without complication (HCC)   . Hypertension   . Mixed diabetic hyperlipidemia associated with type 2 diabetes mellitus (HCC) 08/29/2019  . Osteoporosis   . Retinal vein occlusion   . Type 2 diabetes mellitus without complication, without long-term current use of insulin (HCC) 08/29/2019  . Vitamin D deficiency    Past Surgical History: History reviewed. No pertinent surgical history. HPI:  Pt is a 86 y.o. female, with medical history of paroxysmal atrial fibrillation on chronic anticoagulant with Coumadin, diabetes mellitus type 2, vitamin D deficiency who was brought to the hospital from SNF after patient had slurred speech. Code stroke was activated. CT head was negative. She was outside window for TPA. MRI brain showed 12 mm acute infarct within the right caudate head, 60 mm acute infarct in the right lentiform nucleus. Pt failed the Yale on 10/31/19   Assessment / Plan / Recommendation Clinical Impression  Pt was seen for bedside swallow evaluation. Pt's daughter reported that she typically cuts her food up, but she denied observance of any signs of aspiration with solids or liquids. Oral mechanism exam revealed reduced lingual ROM and strength. Dentition was natural and adequate. Pt's rate of intake was reduced and difficulty with A-P transport is suspected. Mastication was mildly prolonged and mild lingual residue was noted with regular texture solids. A dysphagia 3 diet with thin liquids is recommended at this time and SLP will follow to assess pt's tolerance  and need for further intervention.  SLP Visit Diagnosis: Dysphagia, unspecified (R13.10)    Aspiration Risk  Mild aspiration risk    Diet Recommendation Dysphagia 3 (Mech soft);Thin liquid   Liquid Administration via: Cup;Straw Medication Administration: Whole meds with puree Supervision: Patient able to self feed Compensations: Slow rate;Follow solids with liquid Postural Changes: Seated upright at 90 degrees;Remain upright for at least 30 minutes after po intake    Other  Recommendations Oral Care Recommendations: Oral care BID   Follow up Recommendations Skilled Nursing facility      Frequency and Duration min 2x/week  2 weeks       Prognosis Prognosis for Safe Diet Advancement: Good Barriers to Reach Goals: Cognitive deficits      Swallow Study   General Date of Onset: 10/31/19 HPI: Pt is a 86 y.o. female, with medical history of paroxysmal atrial fibrillation on chronic anticoagulant with Coumadin, diabetes mellitus type 2, vitamin D deficiency who was brought to the hospital from SNF after patient had slurred speech. Code stroke was activated. CT head was negative. She was outside window for TPA. MRI brain showed 12 mm acute infarct within the right caudate head, 60 mm acute infarct in the right lentiform nucleus. Pt failed the Yale on 10/31/19 Type of Study: Bedside Swallow Evaluation Previous Swallow Assessment: None Diet Prior to this Study: NPO Temperature Spikes Noted: No Respiratory Status: Room air History of Recent Intubation: No Behavior/Cognition: Alert;Cooperative;Pleasant mood Oral Cavity Assessment: Within Functional Limits Vision: Functional for self-feeding Self-Feeding Abilities: Able to feed self Patient Positioning: Upright in bed;Postural control adequate for testing Baseline Vocal Quality: Normal Volitional Cough: Strong Volitional Swallow:  Able to elicit    Oral/Motor/Sensory Function Overall Oral Motor/Sensory Function: Mild impairment Facial  ROM: Within Functional Limits Facial Symmetry: Within Functional Limits Facial Strength: Within Functional Limits Facial Sensation: Within Functional Limits Lingual ROM: Reduced left;Suspected CN XII (hypoglossal) dysfunction Lingual Symmetry: Within Functional Limits Lingual Strength: Reduced;Suspected CN XII (hypoglossal) dysfunction Lingual Sensation: Within Functional Limits Velum: Within Functional Limits   Ice Chips Ice chips: Not tested   Thin Liquid Thin Liquid: Within functional limits Presentation: Cup;Straw    Nectar Thick Nectar Thick Liquid: Not tested   Honey Thick Honey Thick Liquid: Not tested   Puree Puree: Within functional limits Presentation: Spoon Other Comments: Slow rate of intake and slow oral prep/transport suspected   Solid     Solid: Impaired Presentation: Self Fed Oral Phase Functional Implications: Oral residue (mild lingual)     Arlisa Leclere I. Vear Clock, MS, CCC-SLP Acute Rehabilitation Services Office number [phone] Pager [phone]  Scheryl Marten 11/02/2019,11:17 AM

## 2019-11-02 NOTE — TOC Initial Note (Signed)
Transition of Care [facility]) - Initial/Assessment Note    Patient Details  Name: Susan Potter MRN: [id number] Date of Birth: 31-Jan-1927  [facility]) CM/SW Contact:    Susan Ochs, LCSW Phone Number: 11/02/2019, 4:01 PM  Clinical Narrative:   CSW met with patient and daughter at bedside to discuss return to [facility] at discharge. Daughter confirmed plan to return to [facility] to continue rehabilitation. CSW confirmed bed availability at [facility] and initiated insurance authorization request. CSW to follow.                Expected Discharge Plan: Skilled Nursing Facility Barriers to Discharge: Insurance Authorization, Continued Medical Work up   Patient Goals and CMS Choice Patient states their goals for this hospitalization and ongoing recovery are:: patient unable to participate in goal setting due to disorientation CMS Medicare.gov Compare Post Acute Care list provided to:: Patient Represenative (must comment) Choice offered to / list presented to : Adult Children  Expected Discharge Plan and Services Expected Discharge Plan: [place] Acute Care Choice: Susan Potter for the past 2 months: Single Family Home                                      Prior Living Potter/Services Living Potter for the past 2 months: Single Family Home Lives with:: Self Patient language and need for interpreter reviewed:: No Do you feel safe going back to the place where you live?: Yes      Need for Family Participation in Patient Care: Yes (Comment) Care giver support system in place?: No (comment)   Criminal Activity/Legal Involvement Pertinent to Current Situation/Hospitalization: No - Comment as needed  Activities of Daily Living      Permission Sought/Granted Permission sought to share information with : Facility Sport and exercise psychologist, Family Supports    Share Information with NAME: Susan Potter  Permission granted to share info w AGENCY: [facility]  Permission granted to share info w Relationship: Children     Emotional Assessment Appearance:: Appears stated age Attitude/Demeanor/Rapport: Unable to Assess Affect (typically observed): Unable to Assess Orientation: : Oriented to Self Alcohol / Substance Use: Not Applicable Psych Involvement: No (comment)  Admission diagnosis:  CVA (cerebral vascular accident) [facility]) [I63.9] Patient Active Problem List   Diagnosis Date Noted  . CVA (cerebral vascular accident) ([place]) 10/31/2019  . AMS (altered mental status) 09/18/2019  . E. coli UTI   . Hypomagnesemia 08/30/2019  . Acute lower UTI 08/30/2019  . Vitamin D deficiency 08/30/2019  . AF (paroxysmal atrial fibrillation) ([place]) 08/29/2019  . Type 2 diabetes mellitus without complication, without long-term current use of insulin ([place]) 08/29/2019  . Mixed diabetic hyperlipidemia associated with type 2 diabetes mellitus (Susan Potter) 08/29/2019  . Essential hypertension 08/29/2019  . Fall at home, initial encounter 08/29/2019  . Tibial plateau fracture, right, closed, initial encounter 08/29/2019  . Fracture of right tibial plateau, closed, initial encounter 08/29/2019  . Age-related osteoporosis without current pathological fracture 10/15/2017  . Encounter for therapeutic drug monitoring 05/12/2013  . Long term (current) use of anticoagulants 02/08/2013  . Type II or unspecified type diabetes mellitus without mention of complication, uncontrolled 11/08/2012  . Unspecified essential hypertension 11/08/2012  . Pure hypercholesterolemia 11/08/2012  . Atrial fibrillation ([place]) 11/08/2012  . Osteoporosis, unspecified 11/08/2012   PCP:  Susan Snare, MD Pharmacy:   [facility]  [place], [place] [address] Phone: [phone] Fax: [phone]     Social Determinants  of Health (SDOH) Interventions    Readmission Risk Interventions No flowsheet data found.

## 2019-11-02 NOTE — Progress Notes (Signed)
  Speech Language Pathology Treatment: Cognitive-Linquistic (Dysarthria)  Patient Details Name: Susan Potter MRN: [id number] DOB: 1926-10-08 Today's Date: 11/02/2019 Time: 2426-8341 SLP Time Calculation (min) (ACUTE ONLY): 16 min  Assessment / Plan / Recommendation Clinical Impression  Pt was seen for dysarthria treatment and was cooperative during the session. Pt and her daughter were educated regarding the nature of dysarthria, the pt's impairments, and compensatory strategies to improve speech intelligibility. Dysarthria handout was given to the pt's daughter to reinforce education and she verbalized understanding regarding all areas of education. She used compensatory strategies at the phrase level with 50% accuracy when cues and modelling were provided for overarticulation and vocal intensity. However, she demonstrated 75% accuracy at the word level with reduced support. SLP will continue to follow pt.    HPI HPI: Pt is a 86 y.o. female, with medical history of paroxysmal atrial fibrillation on chronic anticoagulant with Coumadin, diabetes mellitus type 2, vitamin D deficiency who was brought to the hospital from SNF after patient had slurred speech. Code stroke was activated. CT head was negative. She was outside window for TPA. MRI brain showed 12 mm acute infarct within the right caudate head, 60 mm acute infarct in the right lentiform nucleus. Pt failed the Yale on 10/31/19      SLP Plan  Continue with current plan of care  Patient needs continued Speech Lanaguage Pathology Services    Recommendations  Diet recommendations: Dysphagia 3 (mechanical soft);Thin liquid Liquids provided via: Cup;Straw Medication Administration: Whole meds with puree Supervision: Staff to assist with self feeding;Intermittent supervision to cue for compensatory strategies Compensations: Slow rate;Follow solids with liquid                Oral Care Recommendations: Oral care BID Follow up  Recommendations: Skilled Nursing facility SLP Visit Diagnosis: Dysarthria and anarthria (R47.1) Plan: Continue with current plan of care       Adi Seales I. Vear Clock, MS, CCC-SLP Acute Rehabilitation Services Office number [phone] Pager [phone]                Susan Potter 11/02/2019, 11:35 AM

## 2019-11-02 NOTE — Discharge Instructions (Signed)

## 2019-11-02 NOTE — Progress Notes (Signed)
ANTICOAGULATION CONSULT NOTE - Initial Consult  Pharmacy Consult for Eliquis  Indication: stroke  No Known Allergies  Patient Measurements: Height: 5\' 7"  (170.2 cm) Weight: 93 kg (205 lb) IBW/kg (Calculated) : 61.6  Vital Signs: Temp: 97.6 F (36.4 C) (07/15 0732) Temp Source: Oral (07/15 0732) BP: 117/59 (07/15 0732) Pulse Rate: 80 (07/15 0732)  Labs: Recent Labs    10/31/19 1338 10/31/19 1338 10/31/19 1342 10/31/19 1342 11/01/19 0327 11/01/19 1049 11/01/19 2110 11/02/19 0221 11/02/19 0611  HGB 11.4*   < > 11.9*   < > 10.3*  --   --  10.8*  --   HCT 35.8*   < > 35.0*  --  33.4*  --   --  35.2*  --   PLT 271  --   --   --  252  --   --  237  --   APTT 26  --   --   --   --   --   --   --   --   LABPROT 15.7*  --   --   --  20.0*  --   --  16.1*  --   INR 1.3*  --   --   --  1.8*  --   --  1.4*  --   HEPARINUNFRC  --   --   --   --   --  0.28* 0.75*  --  0.56  CREATININE 1.18*  --  1.20*  --   --   --   --   --   --    < > = values in this interval not displayed.    Estimated Creatinine Clearance: 35.8 mL/min (A) (by C-G formula based on SCr of 1.2 mg/dL (H)).   Medical History: Past Medical History:  Diagnosis Date  . AF (paroxysmal atrial fibrillation) (HCC) 08/29/2019  . Atrial fibrillation (HCC)   . Diabetes mellitus without complication (HCC)   . Hypertension   . Mixed diabetic hyperlipidemia associated with type 2 diabetes mellitus (HCC) 08/29/2019  . Osteoporosis   . Retinal vein occlusion   . Type 2 diabetes mellitus without complication, without long-term current use of insulin (HCC) 08/29/2019  . Vitamin D deficiency     Medications:  Scheduled:  . apixaban  5 mg Oral BID  . atorvastatin  10 mg Oral Daily  . insulin aspart  0-9 Units Subcutaneous TID WC  . raloxifene  60 mg Oral Daily    Assessment: Patient presented with acute stroke while managed outpatient on warfarin. Was transitioned to heparin inpatient. Pharmacy consulted to transition  to Eliquis after passing swallow study- passed 7/15. Age 86, weight 93 kg, Scr 1.2. Qualifies for 5mg  BID - watch Scr to assess dose reduction if inc to >1.5   Goal of Therapy:  Monitor platelets by anticoagulation protocol: Yes   Plan:  D/c heparin  Transition to Eliquis 5mg  BID Monitor Scr to assess if dose decrease is needed  TOC Consult placed to assess prescription coverage  Mary-Ashlyn  Malyn Aytes 11/02/2019,11:37 AM

## 2019-11-02 NOTE — Progress Notes (Signed)
PROGRESS NOTE    Susan Potter  ZOX:[id number] DOB: 08-25-1926 DOA: 10/31/2019 PCP: [name], MD    Brief Ashland Osmer  is a 86 y.o. female, with medical history of paroxysmal atrial fibrillation on chronic anticoagulant with Coumadin, diabetes mellitus type 2, vitamin D deficiency who was brought to the hospital from skilled facility after patient had slurred speech at skilled nursing facility around 9 AM.  When paramedics arrived she was noted to have some difficulty with speaking but otherwise seem to be at baseline. Patient was brought to the ED, did not have any neurological deficits on exam.  Code stroke was activated.  CT head was negative.  She was outside window for TPA. MRI brain showed 12 mm acute infarct within the right caudate head, 60 mm acute infarct in the right lentiform nucleus. Patient denies chest pain or shortness of breath. Denies nausea vomiting or diarrhea  Assessment & Plan:   Active Problems:   CVA (cerebral vascular accident) (HCC)  #1 acute stroke with left-sided weakness patient with history of atrial fibrillation and chronic anticoagulation with Coumadin.  Work-up included MRI of the brain-12 mm acute infarct within the right caudate, 16 mm acute infarct within the right lentiform nucleus.  Generalized atrophy mild to moderate chronic small vessel ischemic changes and chronic lacunar infarcts are present within the bilateral thalamus and left basal ganglia and right cerebellum. CT angiogram of the head and neck embolus at the right carotid terminus slow flow visible in the right and ACA and MCA territories particularly MCA territory.  Nonstenotic atherosclerotic disease at both carotid bifurcations.  No flow in the left vertebral artery at the foramen magnum level. Echo ejection fraction 60 to 65%.  No wall motion abnormalities.  Right regular function is normal.  Normal pulmonary artery systolic pressure. LDL 53. Hemoglobin A1c 6.1.  Patient  started on Eliquis today per neurology. PT OT recommends SNF/case manager aware Seen by speech therapy on dysphagia 3 diet.  #2 chronic atrial fibrillation paroxysmal rate controlled-started on Eliquis today Coumadin stopped.  #3 type 2 diabetes on Lantus. CBG (last 3)  Recent Labs    11/01/19 2126 11/02/19 0613 11/02/19 1120  GLUCAP 115* 136* 194*   Hypoglycemia resolved.  Will restart Lantus.  #4 essential hypertension blood pressure 127/55 not on any antihypertensives continue to monitor.    #5 hyperlipidemia on statins.  LDL 53.  #6 acquired thrombophilia patient at high risk for blood clots on Eliquis to prevent further strokes.   Estimated body mass index is 32.11 kg/m as calculated from the following:   Height as of this encounter:  (1.702 m).   Weight as of this encounter: 93 kg.  DVT prophylaxis: Coumadin Code Status: DNR Family Communication: None at bedside Disposition Plan:  Status is: Inpatient  Dispo: The patient is from: SNF              Anticipated d/c is to: SNF              Anticipated d/c date is:1 day              Patient currently he is medically stable to discharge.  Patient admitted with acute stroke and work-up pending on IV heparin to achieve therapeutic INR on Coumadin.    Consultants: Neurology Procedures: None Antimicrobials:  None Subjective: She is more awake today trying to answer my questions she knows she is going to a rehab facility  Objective: Vitals:   11/02/19 0001 11/02/19 0319  11/02/19 0732 11/02/19 1140  BP: (!) 152/75 130/71 (!) 117/59 (!) 127/55  Pulse: 72 78 80 81  Resp: 19 18 18 18   Temp: 97.9 F (36.6 C) (!) 97.4 F (36.3 C) 97.6 F (36.4 C) 98.2 F (36.8 C)  TempSrc:  Oral Oral Oral  SpO2: 100% 98% 92% 100%  Weight:      Height:        Intake/Output Summary (Last 24 hours) at 11/02/2019 1235 Last data filed at 11/02/2019 0600 Gross per 24 hour  Intake 992 ml  Output 1100 ml  Net -108 ml   Filed  Weights   10/31/19 1409  Weight: 93 kg    Examination:  General exam: Appears calm and comfortable  Respiratory system: Clear to auscultation. Respiratory effort normal. Cardiovascular system: S1 & S2 heard, RRR. No JVD, murmurs, rubs, gallops or clicks. No pedal edema. Gastrointestinal system: Abdomen is nondistended, soft and nontender. No organomegaly or masses felt. Normal bowel sounds heard. Central nervous system: 2 x 5 power in the left lower extremity and left upper extremity Extremities: No edema  skin: No rashes, lesions or ulcers Psychiatry: Unable to assess    Data Reviewed: I have personally reviewed following labs and imaging studies  CBC: Recent Labs  Lab 10/31/19 1338 10/31/19 1342 11/01/19 0327 11/02/19 0221  WBC 11.9*  --  9.0 9.2  NEUTROABS 9.1*  --   --   --   HGB 11.4* 11.9* 10.3* 10.8*  HCT 35.8* 35.0* 33.4* 35.2*  MCV 92.3  --  89.8 91.2  PLT 271  --  252 237   Basic Metabolic Panel: Recent Labs  Lab 10/31/19 1338 10/31/19 1342  NA 138 138  K 4.4 4.3  CL 99 98  CO2 27  --   GLUCOSE 119* 113*  BUN 14 17  CREATININE 1.18* 1.20*  CALCIUM 9.1  --    GFR: Estimated Creatinine Clearance: 35.8 mL/min (A) (by C-G formula based on SCr of 1.2 mg/dL (H)). Liver Function Tests: Recent Labs  Lab 10/31/19 1338  AST 14*  ALT 11  ALKPHOS 58  BILITOT 0.6  PROT 6.8  ALBUMIN 2.7*   No results for input(s): LIPASE, AMYLASE in the last 168 hours. No results for input(s): AMMONIA in the last 168 hours. Coagulation Profile: Recent Labs  Lab 10/31/19 1338 11/01/19 0327 11/02/19 0221  INR 1.3* 1.8* 1.4*   Cardiac Enzymes: No results for input(s): CKTOTAL, CKMB, CKMBINDEX, TROPONINI in the last 168 hours. BNP (last 3 results) No results for input(s): PROBNP in the last 8760 hours. HbA1C: Recent Labs    10/31/19 1338 11/01/19 0327  HGBA1C 6.1* 6.1*   CBG: Recent Labs  Lab 11/01/19 1615 11/01/19 1822 11/01/19 2126 11/02/19 0613  11/02/19 1120  GLUCAP 88 116* 115* 136* 194*   Lipid Profile: Recent Labs    11/01/19 0327  CHOL 106  HDL 37*  LDLCALC 53  TRIG 81  CHOLHDL 2.9   Thyroid Function Tests: No results for input(s): TSH, T4TOTAL, FREET4, T3FREE, THYROIDAB in the last 72 hours. Anemia Panel: Recent Labs    11/01/19 2142  VITAMINB12 108*   Sepsis Labs: No results for input(s): PROCALCITON, LATICACIDVEN in the last 168 hours.  Recent Results (from the past 240 hour(s))  SARS Coronavirus 2 by RT PCR (hospital order, performed in [facility] hospital lab) Nasopharyngeal Nasopharyngeal Swab     Status: None   Collection Time: 10/31/19  2:24 PM   Specimen: Nasopharyngeal Swab  Result Value Ref  Range Status   SARS Coronavirus 2 NEGATIVE NEGATIVE Final    Comment: (NOTE) SARS-CoV-2 target nucleic acids are NOT DETECTED.  The SARS-CoV-2 RNA is generally detectable in upper and lower respiratory specimens during the acute phase of infection. The lowest concentration of SARS-CoV-2 viral copies this assay can detect is 250 copies / mL. A negative result does not preclude SARS-CoV-2 infection and should not be used as the sole basis for treatment or other patient management decisions.  A negative result may occur with improper specimen collection / handling, submission of specimen other than nasopharyngeal swab, presence of viral mutation(s) within the areas targeted by this assay, and inadequate number of viral copies (<250 copies / mL). A negative result must be combined with clinical observations, patient history, and epidemiological information.  Fact Sheet for Patients:   BoilerBrush.com.cyhttps://www.fda.gov/media/136312/download  Fact Sheet for Healthcare Providers: https://pope.com/https://www.fda.gov/media/136313/download  This test is not yet approved or  cleared by the [place] FDA and has been authorized for detection and/or diagnosis of SARS-CoV-2 by FDA under an Emergency Use Authorization (EUA).  This EUA will  remain in effect (meaning this test can be used) for the duration of the COVID-19 declaration under Section 564(b)(1) of the Act, 21 U.S.C. section 360bbb-3(b)(1), unless the authorization is terminated or revoked sooner.  Performed at [facility], [address] [place], [place] [address]          Radiology Studies: CT ANGIO HEAD W OR WO CONTRAST  Result Date: 11/01/2019 CLINICAL DATA:  Follow-up stroke. Right basal ganglia infarctions shown by MRI yesterday. EXAM: CT ANGIOGRAPHY HEAD AND NECK TECHNIQUE: Multidetector CT imaging of the head and neck was performed using the standard protocol during bolus administration of intravenous contrast. Multiplanar CT image reconstructions and MIPs were obtained to evaluate the vascular anatomy. Carotid stenosis measurements (when applicable) are obtained utilizing NASCET criteria, using the distal internal carotid diameter as the denominator. CONTRAST:  75mL OMNIPAQUE IOHEXOL 350 MG/ML SOLN COMPARISON:  MRI and CT studies done yesterday. FINDINGS: CT HEAD FINDINGS Brain: Generalized atrophy. Chronic small-vessel ischemic changes throughout the cerebral hemispheric white matter. Old lacunar infarctions of the basal ganglia and thalami. I do not see newly developing low-density related to the acute right basal ganglia infarctions shown on MRI yesterday. No mass effect or hemorrhage. No hydrocephalus or extra-axial collection. Vascular: There is atherosclerotic calcification of the major vessels at the base of the brain. Skull: Negative Sinuses: Clear/normal Orbits: Normal Review of the MIP images confirms the above findings CTA NECK FINDINGS Aortic arch: Aortic atherosclerosis. No brachiocephalic origin stenosis of significance. Right carotid system: Common carotid artery widely patent to the bifurcation. Calcified plaque at the carotid bifurcation and ICA bulb but no stenosis. Cervical ICA widely patent beyond that. Left carotid system: Common  carotid artery widely patent to the bifurcation. Calcified plaque at the carotid bifurcation and ICA bulb. No stenosis. Cervical ICA widely patent beyond that. Vertebral arteries: Vertebral artery origins not included. Right vertebral artery patent through the cervical region to the foramen magnum. Left vertebral artery is a small vessel which appears to be occluded at the skull base level. Skeleton: Cervical spondylosis. Other neck: No mass or lymphadenopathy. Upper chest: Negative Review of the MIP images confirms the above findings CTA HEAD FINDINGS Anterior circulation: Both internal carotid arteries are patent through the skull base and siphon regions. There is ordinary siphon atherosclerotic calcification but without stenosis greater than 30%. The anterior and middle cerebral vessels on the left are patent.  On the right, there is embolic occlusion of the supraclinoid ICA at the carotid terminus. This was visible on the MR angiography yesterday. There is some flow visible in the right ACA and MCA branches. Posterior circulation: Right vertebral artery is patent through the foramen magnum to the basilar. Atherosclerotic plaque at the right vertebral artery V4 segment with stenosis of 30%. No antegrade flow in the left vertebral artery at the foramen magnum. Retrograde flow in the distal left vertebral artery back to PICA. No basilar stenosis. Posterior circulation branch vessels are normal. Primary anterior circulation supply of both posterior cerebral arteries. Venous sinuses: Patent and normal. Anatomic variants: None significant. Review of the MIP images confirms the above findings IMPRESSION: Embolus at the right carotid terminus. Some flow visible in the right and ACA and MCA territories presently, though reduced, particularly in the MCA territory. Probably similar appearance when compared to the MR angiography of yesterday. One could argue that there is more blood flow in the MCA territory presently, but  this is difficult to state with certainty comparing the different techniques. Nonstenotic atherosclerotic disease at both carotid bifurcations. No flow in the left vertebral artery at the foramen magnum level. Retrograde flow in the distal left vertebral artery back to PICA. Findings discussed with Dr. Roda Shutters during image transfer at 1030 hours. Electronically Signed   By: Paulina Fusi M.D.   On: 11/01/2019 10:43   DG Chest 2 View  Result Date: 10/31/2019 CLINICAL DATA:  Code stroke.  CVA. EXAM: CHEST - 2 VIEW COMPARISON:  09/18/2019 FINDINGS: Stable upper normal heart size. Unchanged mediastinal contours with aortic atherosclerosis. No acute or focal airspace disease. No pulmonary edema, pleural effusion, or pneumothorax. Chronic degenerative change of the left shoulder. No acute osseous abnormalities are seen. IMPRESSION: No acute chest findings. Aortic Atherosclerosis (ICD10-I70.0). Electronically Signed   By: Narda Rutherford M.D.   On: 10/31/2019 20:05   CT ANGIO NECK W OR WO CONTRAST  Result Date: 11/01/2019 CLINICAL DATA:  Follow-up stroke. Right basal ganglia infarctions shown by MRI yesterday. EXAM: CT ANGIOGRAPHY HEAD AND NECK TECHNIQUE: Multidetector CT imaging of the head and neck was performed using the standard protocol during bolus administration of intravenous contrast. Multiplanar CT image reconstructions and MIPs were obtained to evaluate the vascular anatomy. Carotid stenosis measurements (when applicable) are obtained utilizing NASCET criteria, using the distal internal carotid diameter as the denominator. CONTRAST:  64mL OMNIPAQUE IOHEXOL 350 MG/ML SOLN COMPARISON:  MRI and CT studies done yesterday. FINDINGS: CT HEAD FINDINGS Brain: Generalized atrophy. Chronic small-vessel ischemic changes throughout the cerebral hemispheric white matter. Old lacunar infarctions of the basal ganglia and thalami. I do not see newly developing low-density related to the acute right basal ganglia infarctions  shown on MRI yesterday. No mass effect or hemorrhage. No hydrocephalus or extra-axial collection. Vascular: There is atherosclerotic calcification of the major vessels at the base of the brain. Skull: Negative Sinuses: Clear/normal Orbits: Normal Review of the MIP images confirms the above findings CTA NECK FINDINGS Aortic arch: Aortic atherosclerosis. No brachiocephalic origin stenosis of significance. Right carotid system: Common carotid artery widely patent to the bifurcation. Calcified plaque at the carotid bifurcation and ICA bulb but no stenosis. Cervical ICA widely patent beyond that. Left carotid system: Common carotid artery widely patent to the bifurcation. Calcified plaque at the carotid bifurcation and ICA bulb. No stenosis. Cervical ICA widely patent beyond that. Vertebral arteries: Vertebral artery origins not included. Right vertebral artery patent through the cervical region to the foramen  magnum. Left vertebral artery is a small vessel which appears to be occluded at the skull base level. Skeleton: Cervical spondylosis. Other neck: No mass or lymphadenopathy. Upper chest: Negative Review of the MIP images confirms the above findings CTA HEAD FINDINGS Anterior circulation: Both internal carotid arteries are patent through the skull base and siphon regions. There is ordinary siphon atherosclerotic calcification but without stenosis greater than 30%. The anterior and middle cerebral vessels on the left are patent. On the right, there is embolic occlusion of the supraclinoid ICA at the carotid terminus. This was visible on the MR angiography yesterday. There is some flow visible in the right ACA and MCA branches. Posterior circulation: Right vertebral artery is patent through the foramen magnum to the basilar. Atherosclerotic plaque at the right vertebral artery V4 segment with stenosis of 30%. No antegrade flow in the left vertebral artery at the foramen magnum. Retrograde flow in the distal left  vertebral artery back to PICA. No basilar stenosis. Posterior circulation branch vessels are normal. Primary anterior circulation supply of both posterior cerebral arteries. Venous sinuses: Patent and normal. Anatomic variants: None significant. Review of the MIP images confirms the above findings IMPRESSION: Embolus at the right carotid terminus. Some flow visible in the right and ACA and MCA territories presently, though reduced, particularly in the MCA territory. Probably similar appearance when compared to the MR angiography of yesterday. One could argue that there is more blood flow in the MCA territory presently, but this is difficult to state with certainty comparing the different techniques. Nonstenotic atherosclerotic disease at both carotid bifurcations. No flow in the left vertebral artery at the foramen magnum level. Retrograde flow in the distal left vertebral artery back to PICA. Findings discussed with Dr. Roda Shutters during image transfer at 1030 hours. Electronically Signed   By: Paulina Fusi M.D.   On: 11/01/2019 10:43   MR ANGIO HEAD WO CONTRAST  Addendum Date: 10/31/2019   ADDENDUM REPORT: 10/31/2019 19:45 ADDENDUM: These results were called by telephone at the time of interpretation on 10/31/2019 at 7:45 pm to provider [facility] , who verbally acknowledged these results. Electronically Signed   By: Jackey Loge DO   On: 10/31/2019 19:45   Result Date: 10/31/2019 CLINICAL DATA:  Stroke, follow-up. EXAM: MRA HEAD WITHOUT CONTRAST TECHNIQUE: Angiographic images of the Circle of Willis were obtained using MRA technique without intravenous contrast. COMPARISON:  Noncontrast brain MRI performed earlier the same day, noncontrast head CT performed earlier the same day FINDINGS: The examination is mild to moderately motion degraded, limiting evaluation for stenoses and for small aneurysms. There is symmetric signal loss within the bilateral internal carotid arteries at the level of the skull base, likely  artifactual. There is moderate atherosclerotic narrowing of the cavernous right ICA. There is apparent occlusion of the very distal right ICA terminus. No significant flow-related enhancement is demonstrated within the M1 right middle cerebral artery. Additionally, there is a paucity of flow related enhancement within the proximal A1 right anterior cerebral artery, which may be occluded. The M1 left middle cerebral artery is patent without significant stenosis. No left M2 proximal branch occlusion is identified. The A1 left anterior cerebral artery and more distal anterior cerebral arteries are patent. No intracranial aneurysm is identified. The non dominant V4 left vertebral artery is poorly delineated proximally, which may be due to small vessel size or high-grade stenosis. Moderate/severe focal stenosis within the dominant V4 right vertebral artery (series 1017, image 10). The posterior cerebral arteries are patent  bilaterally without definite high-grade proximal stenosis. The posterior cerebral is are predominantly fetal in origin bilaterally. IMPRESSION: Mild to moderately motion degradation examination, limiting evaluation for stenoses and for small aneurysms. Apparent occlusion at the level of the very distal right ICA terminus. There is no significant flow related enhancement within the M1 right middle cerebral artery and there is minimal appreciable flow-related enhancement within the M2 and more distal right MCA branches. There is also a paucity of flow related enhancement within the proximal A1 right anterior cerebral artery, possibly occluded. Moderate atherosclerotic narrowing of the cavernous right ICA. Moderate/severe focal stenosis within the dominant V4 right vertebral artery. Attenuated appearance of the proximal V4 left vertebral artery, which may be due to small vessel size or high-grade stenosis. Electronically Signed: By: Jackey Loge DO On: 10/31/2019 19:33   MR BRAIN WO CONTRAST  Result  Date: 10/31/2019 CLINICAL DATA:  Neuro deficit, acute, stroke suspected. Additional history obtained from radiology records, slurred speech. EXAM: MRI HEAD WITHOUT CONTRAST TECHNIQUE: Multiplanar, multiecho pulse sequences of the brain and surrounding structures were obtained without intravenous contrast. COMPARISON:  Noncontrast head CT performed earlier the same day. FINDINGS: Brain: Moderate generalized parenchymal atrophy. 12 mm focus of restricted diffusion within the right caudate head consistent with acute infarction. Additional 16 mm acute infarct within the right lentiform nucleus. No evidence of acute infarct elsewhere within the brain. Redemonstrated chronic lacunar infarcts within the bilateral thalami, left basal ganglia and right cerebellar. Background moderate to advanced patchy T2/FLAIR hyperintensity within the cerebral white matter which is nonspecific, but consistent with chronic small vessel ischemic disease. No evidence of intracranial mass. No chronic intracranial blood products. No extra-axial fluid collection. No midline shift. Vascular: No definite loss of proximal large arterial flow voids. Skull and upper cervical spine: No focal marrow lesion. Sinuses/Orbits: Visualized orbits show no acute finding. No significant paranasal sinus disease or mastoid effusion at the imaged levels. IMPRESSION: Mildly motion degraded examination. 12 mm acute infarct within the right caudate head. 16 mm acute infarct within the right lentiform nucleus. Background moderate generalized parenchymal atrophy and mild-to-moderate chronic small vessel ischemic disease. Chronic lacunar infarcts are present within the bilateral thalami, left basal ganglia and right cerebellum. Electronically Signed   By: Jackey Loge DO   On: 10/31/2019 17:21   ECHOCARDIOGRAM COMPLETE  Result Date: 11/01/2019    ECHOCARDIOGRAM REPORT   Patient Name:   Hazelene APPLE Kutz Date of Exam: 11/01/2019 Medical Rec #:  [id number]            Height:       67.0 in Accession #:    [id number]          Weight:       205.0 lb Date of Birth:  [birth year]           BSA:          2.044 m Patient Age:    89 years            BP:           118/75 mmHg Patient Gender: F                   HR:           75 bpm. Exam Location:  Inpatient Procedure: 2D Echo Indications:    stroke 434.91  History:        Patient has prior history of Echocardiogram examinations, most  recent 08/30/2019. Arrythmias:Atrial Fibrillation; Risk                 Factors:Diabetes, Hypertension and Dyslipidemia.  Sonographer:    Celene Skeen RDCS (AE) Referring Phys: Gomez Cleverly LAMA IMPRESSIONS  1. Left ventricular ejection fraction, by estimation, is 60 to 65%. The left ventricle has normal function. The left ventricle has no regional wall motion abnormalities. Left ventricular diastolic parameters are indeterminate.  2. Right ventricular systolic function is normal. The right ventricular size is normal. There is normal pulmonary artery systolic pressure.  3. Left atrial size was moderately dilated.  4. The mitral valve is normal in structure. Trivial mitral valve regurgitation. No evidence of mitral stenosis.  5. The aortic valve is tricuspid. Aortic valve regurgitation is mild. No aortic stenosis is present.  6. The inferior vena cava is normal in size with greater than 50% respiratory variability, suggesting right atrial pressure of 3 mmHg. FINDINGS  Left Ventricle: Left ventricular ejection fraction, by estimation, is 60 to 65%. The left ventricle has normal function. The left ventricle has no regional wall motion abnormalities. The left ventricular internal cavity size was normal in size. There is  no left ventricular hypertrophy. Left ventricular diastolic parameters are indeterminate. Right Ventricle: The right ventricular size is normal. No increase in right ventricular wall thickness. Right ventricular systolic function is normal. There is normal pulmonary artery  systolic pressure. The tricuspid regurgitant velocity is 2.36 m/s, and  with an assumed right atrial pressure of 3 mmHg, the estimated right ventricular systolic pressure is 25.3 mmHg. Left Atrium: Left atrial size was moderately dilated. Right Atrium: Right atrial size was normal in size. Pericardium: There is no evidence of pericardial effusion. Mitral Valve: The mitral valve is normal in structure. Normal mobility of the mitral valve leaflets. Trivial mitral valve regurgitation. No evidence of mitral valve stenosis. Tricuspid Valve: The tricuspid valve is normal in structure. Tricuspid valve regurgitation is trivial. No evidence of tricuspid stenosis. Aortic Valve: The aortic valve is tricuspid. . There is moderate thickening and moderate calcification of the aortic valve. Aortic valve regurgitation is mild. No aortic stenosis is present. There is moderate thickening of the aortic valve. There is moderate calcification of the aortic valve. Pulmonic Valve: The pulmonic valve was normal in structure. Pulmonic valve regurgitation is not visualized. No evidence of pulmonic stenosis. Aorta: The aortic root is normal in size and structure. Venous: The inferior vena cava is normal in size with greater than 50% respiratory variability, suggesting right atrial pressure of 3 mmHg. IAS/Shunts: No atrial level shunt detected by color flow Doppler.  LEFT VENTRICLE PLAX 2D LVIDd:         3.70 cm LVIDs:         2.20 cm LV PW:         1.00 cm LV IVS:        0.90 cm LVOT diam:     1.90 cm LV SV:         61 LV SV Index:   30 LVOT Area:     2.84 cm  LEFT ATRIUM             Index LA diam:        4.10 cm 2.01 cm/m LA Vol (A2C):   68.5 ml 33.52 ml/m LA Vol (A4C):   75.5 ml 36.94 ml/m LA Biplane Vol: 74.3 ml 36.36 ml/m  AORTIC VALVE LVOT Vmax:   104.00 cm/s LVOT Vmean:  85.300 cm/s LVOT VTI:  0.215 m  AORTA Ao Root diam: 3.30 cm MITRAL VALVE                TRICUSPID VALVE MV Area (PHT): 3.03 cm     TR Peak grad:   22.3 mmHg MV  Decel Time: 250 msec     TR Vmax:        236.00 cm/s MV E velocity: 118.00 cm/s                             SHUNTS                             Systemic VTI:  0.22 m                             Systemic Diam: 1.90 cm Chilton Si MD Electronically signed by Chilton Si MD Signature Date/Time: 11/01/2019/1:43:58 PM    Final    CT HEAD CODE STROKE WO CONTRAST  Result Date: 10/31/2019 CLINICAL DATA:  Code stroke. Slurred speech. Last seen normal 5.5 hours ago. EXAM: CT HEAD WITHOUT CONTRAST TECHNIQUE: Contiguous axial images were obtained from the base of the skull through the vertex without intravenous contrast. COMPARISON:  CT head without contrast 09/18/2019. FINDINGS: Brain: Advanced atrophy and white matter disease is similar the prior study. By lacunar infarcts of the thalami bilaterally are stable. Remote lacunar infarct in the left caudate head is stable. Basal ganglia are otherwise unremarkable. The insular ribbon is normal bilaterally. No acute cortical abnormalities are present. The ventricles are proportionate to the degree of atrophy. No significant extraaxial fluid collection is present. A remote lacunar infarct is present in the posterior right cerebellum. Vascular: Atherosclerotic calcifications are present within the cavernous internal carotid arteries bilaterally. Calcification is present at the dural margin of both vertebral arteries. No hyperdense vessel is present Skull: Calvarium is intact. No focal lytic or blastic lesions are present. No significant extracranial soft tissue lesion is present. Sinuses/Orbits: The paranasal sinuses and mastoid air cells are clear. Left lens replacement is present. Globes and orbits are otherwise within normal limits. ASPECTS [facility] Stroke Program Early CT Score) - Ganglionic level infarction (caudate, lentiform nuclei, internal capsule, insula, M1-M3 cortex): 7/7 - Supraganglionic infarction (M4-M6 cortex): 3/3 Total score (0-10 with 10 being normal):  10/10 IMPRESSION: 1. No acute intracranial abnormality or significant interval change. 2. Stable advanced atrophy and white matter disease. 3. Stable remote lacunar infarcts of the thalami bilaterally, left caudate head, and posterior right cerebellum. The above was relayed via text pager to Dr. Wilford Corner on 10/31/2019 at 13:51 . Electronically Signed   By: Marin Roberts M.D.   On: 10/31/2019 13:51        Scheduled Meds: . apixaban  5 mg Oral BID  . atorvastatin  10 mg Oral Daily  . insulin aspart  0-9 Units Subcutaneous TID WC  . raloxifene  60 mg Oral Daily   Continuous Infusions: . sodium chloride Stopped (11/01/19 1237)  . cefTRIAXone (ROCEPHIN)  IV 1 g (11/01/19 1949)  . dextrose 5 % and 0.9% NaCl 100 mL/hr at 11/02/19 0600     LOS: 2 days     Alwyn Ren, MD 11/02/2019, 12:35 PM

## 2019-11-02 NOTE — NC FL2 (Signed)
[place] MEDICAID FL2 LEVEL OF CARE SCREENING TOOL     IDENTIFICATION  Patient Name: Susan Potter JOINOMVEH Birthdate: 1927/02/23 Sex: female Admission Date (Current Location): 10/31/2019  [facility] and [place] Number:  Producer, television/film/video and Address:  [facility] [place], [address], [place], [place] [address]      Provider Number: [id number]  Attending Physician Name and Address:  Alwyn Ren, MD  Relative Name and Phone Number:       Current Level of Care:   Recommended Level of Care: Skilled Nursing Facility Prior Approval Number:    Date Approved/Denied:   PASRR Number:    Discharge Plan: SNF    Current Diagnoses: Patient Active Problem List   Diagnosis Date Noted  . CVA (cerebral vascular accident) (HCC) 10/31/2019  . AMS (altered mental status) 09/18/2019  . E. coli UTI   . Hypomagnesemia 08/30/2019  . Acute lower UTI 08/30/2019  . Vitamin D deficiency 08/30/2019  . AF (paroxysmal atrial fibrillation) (HCC) 08/29/2019  . Type 2 diabetes mellitus without complication, without long-term current use of insulin (HCC) 08/29/2019  . Mixed diabetic hyperlipidemia associated with type 2 diabetes mellitus (HCC) 08/29/2019  . Essential hypertension 08/29/2019  . Fall at home, initial encounter 08/29/2019  . Tibial plateau fracture, right, closed, initial encounter 08/29/2019  . Fracture of right tibial plateau, closed, initial encounter 08/29/2019  . Age-related osteoporosis without current pathological fracture 10/15/2017  . Encounter for therapeutic drug monitoring 05/12/2013  . Long term (current) use of anticoagulants 02/08/2013  . Type II or unspecified type diabetes mellitus without mention of complication, uncontrolled 11/08/2012  . Unspecified essential hypertension 11/08/2012  . Pure hypercholesterolemia 11/08/2012  . Atrial fibrillation (HCC) 11/08/2012  . Osteoporosis, unspecified 11/08/2012    Orientation RESPIRATION BLADDER  Height & Weight     Self  Normal Incontinent Weight: 205 lb (93 kg) Height:  5\' 7"  (170.2 cm)  BEHAVIORAL SYMPTOMS/MOOD NEUROLOGICAL BOWEL NUTRITION STATUS      Incontinent Diet (see DC summary)  AMBULATORY STATUS COMMUNICATION OF NEEDS Skin   Extensive Assist Verbally Normal                       Personal Care Assistance Level of Assistance  Bathing, Feeding, Dressing Bathing Assistance: Maximum assistance Feeding assistance: Limited assistance Dressing Assistance: Maximum assistance     Functional Limitations Info  Speech     Speech Info: Impaired (dysarthria)    SPECIAL CARE FACTORS FREQUENCY  PT (By licensed PT), OT (By licensed OT)     PT Frequency: 5x/wk OT Frequency: 5x/wk            Contractures Contractures Info: Not present    Additional Factors Info  Code Status, Allergies Code Status Info: DNR Allergies Info: NKA   Insulin Sliding Scale Info: 0-9 units 3x/day with meals; Lantus 5 units daily at bed       Current Medications (11/02/2019):  This is the current hospital active medication list Current Facility-Administered Medications  Medication Dose Route Frequency Provider Last Rate Last Admin  . 0.9 %  sodium chloride infusion   Intravenous Continuous 11/04/2019, MD   Stopped at 11/01/19 1237  . acetaminophen (TYLENOL) tablet 650 mg  650 mg Oral Q4H PRN 11/03/19, MD       Or  . acetaminophen (TYLENOL) 160 MG/5ML solution 650 mg  650 mg Per Tube Q4H PRN Meredeth Ide, MD  Or  . acetaminophen (TYLENOL) suppository 650 mg  650 mg Rectal Q4H PRN Meredeth Ide, MD      . apixaban Everlene Balls) tablet 5 mg  5 mg Oral BID Rexford Maus, RPH   5 mg at 11/02/19 1218  . atorvastatin (LIPITOR) tablet 10 mg  10 mg Oral Daily Meredeth Ide, MD      . cefTRIAXone (ROCEPHIN) 1 g in sodium chloride 0.9 % 100 mL IVPB  1 g Intravenous q1800 Marvel Plan, MD 200 mL/hr at 11/01/19 1949 1 g at 11/01/19 1949  . insulin aspart (novoLOG) injection 0-9  Units  0-9 Units Subcutaneous TID WC Meredeth Ide, MD   2 Units at 11/02/19 1218  . insulin glargine (LANTUS) injection 5 Units  5 Units Subcutaneous QHS Alwyn Ren, MD      . raloxifene (EVISTA) tablet 60 mg  60 mg Oral Daily Meredeth Ide, MD   60 mg at 11/02/19 1031  . senna-docusate (Senokot-S) tablet 1 tablet  1 tablet Oral QHS PRN Meredeth Ide, MD         Discharge Medications: Please see discharge summary for a list of discharge medications.  Relevant Imaging Results:  Relevant Lab Results:   Additional Information SS#: [id number]  Baldemar Lenis, LCSW

## 2019-11-02 NOTE — TOC Benefit Eligibility Note (Signed)
Transition of Care [facility]) Benefit Eligibility Note    Patient Details  Name: Susan Potter MRN: [id number] Date of Birth: 20-Jan-1927   Medication/Dose: Arne Cleveland  5 MG BID  Covered?: Yes  Tier: 2 Drug  Prescription Coverage [facility]  Spoke with Person/Company/Phone Number:: KELSEY  @ [facility] [employer] # [phone] OPT- MEMBER  Co-Pay: $50.00  Prior Approval: No  Deductible: Met [name] Phone Number: 11/02/2019, 3:15 PM

## 2019-11-02 NOTE — Evaluation (Signed)
Physical Therapy Evaluation Patient Details Name: Susan Potter MRN: [id number] DOB: 1926/08/23 Today's Date: 11/02/2019   History of Present Illness  Pt is a 86 y.o. female with PMH of HTN, a fib, Typle 2 DM, osteoporosis, R tibial plateau fx, AMS, and hx of falls presenting to ED from SNF with difficulty with speech and weakness. MRI brain showed acute infarcts within the right caudate head and right lentiform nucleus.    Clinical Impression  Pt admitted with above diagnosis. PTA pt at [facility] SNF for rehab following R tibial plateau fx in mid May. Daughter reports pt was ambulating 58' with RW and 2-person assist during her most recent PT session at [facility]. On eval, she required max assist bed mobility and +2 max/total assist transfers.  Pt currently with functional limitations due to the deficits listed below (see PT Problem List). Pt will benefit from skilled PT to increase their independence and safety with mobility to allow discharge to the venue listed below.       Follow Up Recommendations SNF;Supervision/Assistance - 24 hour    Equipment Recommendations  None recommended by PT    Recommendations for Other Services       Precautions / Restrictions Precautions Precautions: Fall Restrictions Weight Bearing Restrictions: No Other Position/Activity Restrictions: No current restrictions RLE. s/p 10 weeks from tib plat fx      Mobility  Bed Mobility Overal bed mobility: Needs Assistance Bed Mobility: Rolling;Sidelying to Sit;Sit to Supine Rolling: Max assist Sidelying to sit: Max assist   Sit to supine: Max assist   General bed mobility comments: assist with BLE and trunk  Transfers Overall transfer level: Needs assistance Equipment used: 2 person hand held assist;Rolling walker (2 wheeled) Transfers: Sit to/from Stand;Lateral/Scoot Transfers Sit to Stand: Max assist;+2 physical assistance        Lateral/Scoot Transfers: Max assist;+2 physical assistance;+2  safety/equipment General transfer comment: Only able to clear bottom a couple inches off bed, unable to attain full stance  Ambulation/Gait                Stairs            Wheelchair Mobility    Modified Rankin (Stroke Patients Only)       Balance Overall balance assessment: Needs assistance Sitting-balance support: Bilateral upper extremity supported;Feet supported Sitting balance-Leahy Scale: Poor Sitting balance - Comments: reliant on external assist Postural control: Right lateral lean Standing balance support: Bilateral upper extremity supported;During functional activity Standing balance-Leahy Scale: Zero Standing balance comment: patient requiring BUE support                             Pertinent Vitals/Pain Pain Assessment: Faces Faces Pain Scale: No hurt    Home Living Family/patient expects to be discharged to:: Skilled nursing facility Living Arrangements: Alone;Children   Type of Home: House           Additional Comments: Was at [facility] s/p dc from fx. Pt's daughter would like pt to return to pt's home from SNF to live with family with 24/7 assist    Prior Function Level of Independence: Needs assistance   Gait / Transfers Assistance Needed: Was working with therapy at SNF +2 for transfers per pt's daughter  ADL's / Homemaking Assistance Needed: Total A for ADLs  Comments: Pt's daughter present during beginning of eval to confirm PLOF.     Hand Dominance   Dominant Hand: Right    Extremity/Trunk Assessment  Upper Extremity Assessment Upper Extremity Assessment: Defer to OT evaluation LUE Deficits / Details: Noted decreased strength and ROM in L UE. LUE Sensation:  (unsure due to cognition) LUE Coordination: decreased gross motor;decreased fine motor    Lower Extremity Assessment Lower Extremity Assessment: RLE deficits/detail;LLE deficits/detail RLE Deficits / Details: limited ROM at knee (relating to recent tib  plat fx) LLE Deficits / Details: increased tone noted in hip/knee flexors, minimal active movement noted. Pt not following commands for MMT.    Cervical / Trunk Assessment Cervical / Trunk Assessment: Kyphotic  Communication   Communication: HOH  Cognition Arousal/Alertness: Lethargic Behavior During Therapy: Flat affect Overall Cognitive Status: Impaired/Different from baseline Area of Impairment: Memory;Problem solving;Orientation;Attention;Following commands;Safety/judgement;Awareness                 Orientation Level: Disoriented to;Situation;Time Current Attention Level: Focused Memory: Decreased short-term memory;Decreased recall of precautions Following Commands: Follows one step commands inconsistently;Follows one step commands with increased time Safety/Judgement: Decreased awareness of deficits Awareness: Intellectual Problem Solving: Slow processing;Decreased initiation;Requires verbal cues;Requires tactile cues General Comments: slow processing and inconsistently following commands      General Comments General comments (skin integrity, edema, etc.): fatigues quickly    Exercises     Assessment/Plan    PT Assessment Patient needs continued PT services  PT Problem List Decreased strength;Decreased range of motion;Decreased activity tolerance;Decreased balance;Decreased mobility;Decreased knowledge of use of DME;Decreased safety awareness;Decreased cognition;Decreased knowledge of precautions       PT Treatment Interventions DME instruction;Functional mobility training;Therapeutic activities;Therapeutic exercise;Balance training;Patient/family education;Cognitive remediation    PT Goals (Current goals can be found in the Care Plan section)  Acute Rehab PT Goals Patient Stated Goal: return to SNF and then home PT Goal Formulation: With patient/family Time For Goal Achievement: 11/16/19 Potential to Achieve Goals: Good    Frequency Min 3X/week   Barriers  to discharge        Co-evaluation PT/OT/SLP Co-Evaluation/Treatment: Yes Reason for Co-Treatment: Complexity of the patient's impairments (multi-system involvement);For patient/therapist safety;To address functional/ADL transfers;Necessary to address cognition/behavior during functional activity PT goals addressed during session: Mobility/safety with mobility;Balance OT goals addressed during session: ADL's and self-care;Strengthening/ROM       AM-PAC PT "6 Clicks" Mobility  Outcome Measure Help needed turning from your back to your side while in a flat bed without using bedrails?: A Lot Help needed moving from lying on your back to sitting on the side of a flat bed without using bedrails?: A Lot Help needed moving to and from a bed to a chair (including a wheelchair)?: Total Help needed standing up from a chair using your arms (e.g., wheelchair or bedside chair)?: Total Help needed to walk in hospital room?: Total Help needed climbing 3-5 steps with a railing? : Total 6 Click Score: 8    End of Session Equipment Utilized During Treatment: Gait belt Activity Tolerance: Patient limited by fatigue Patient left: in bed;with call bell/phone within reach;with bed alarm set Nurse Communication: Mobility status PT Visit Diagnosis: Difficulty in walking, not elsewhere classified (R26.2);Muscle weakness (generalized) (M62.81);Unsteadiness on feet (R26.81);History of falling (Z91.81)    Time: 4097-3532 PT Time Calculation (min) (ACUTE ONLY): 23 min   Charges:   PT Evaluation $PT Eval Moderate Complexity: 1 Mod          Aida Raider, PT  Office # [phone] Pager [phone]   Ilda Foil 11/02/2019, 12:55 PM

## 2019-11-02 NOTE — Progress Notes (Signed)
ANTICOAGULATION CONSULT NOTE  Pharmacy Consult for Heparin >> Apixaban Once Passes Swallow Test Indication: atrial fibrillation and stroke  Assessment: 86 yr old female on warfarin at home for afib. The patient presented to the ED as a code stroke, but was outside the TPA window. The patient was found to have two acute strokes on MRI. Warfarin was  cancelled, due to failed swallow study, and pt was switched to IV heparin.  Heparin level this am 0.56 units/ml  Goal of Therapy:  Heparin level 0.3-0.5 units/ml Monitor platelets by anticoagulation protocol: Yes   Plan:  Reduce heparin infusion to 950 units/hr F/U 8 hour heparin level Monitor daily heparin level, CBC Monitor for signs/symptoms of bleeding Transition from IV heparin to apixaban after pt passes swallow test  Thanks for allowing pharmacy to be a part of this patient's care.  Talbert Cage, PharmD Clinical Pharmacist  11/02/2019 7:04 AM

## 2019-11-02 NOTE — Evaluation (Addendum)
Speech Language Pathology Evaluation Patient Details Name: Susan Potter MRN: [id number] DOB: Jun 30, 1926 Today's Date: 11/02/2019 Time: 1093-2355 SLP Time Calculation (min) (ACUTE ONLY): 17 min  Problem List:  Patient Active Problem List   Diagnosis Date Noted  . CVA (cerebral vascular accident) (HCC) 10/31/2019  . AMS (altered mental status) 09/18/2019  . E. coli UTI   . Hypomagnesemia 08/30/2019  . Acute lower UTI 08/30/2019  . Vitamin D deficiency 08/30/2019  . AF (paroxysmal atrial fibrillation) (HCC) 08/29/2019  . Type 2 diabetes mellitus without complication, without long-term current use of insulin (HCC) 08/29/2019  . Mixed diabetic hyperlipidemia associated with type 2 diabetes mellitus (HCC) 08/29/2019  . Essential hypertension 08/29/2019  . Fall at home, initial encounter 08/29/2019  . Tibial plateau fracture, right, closed, initial encounter 08/29/2019  . Fracture of right tibial plateau, closed, initial encounter 08/29/2019  . Age-related osteoporosis without current pathological fracture 10/15/2017  . Encounter for therapeutic drug monitoring 05/12/2013  . Long term (current) use of anticoagulants 02/08/2013  . Type II or unspecified type diabetes mellitus without mention of complication, uncontrolled 11/08/2012  . Unspecified essential hypertension 11/08/2012  . Pure hypercholesterolemia 11/08/2012  . Atrial fibrillation (HCC) 11/08/2012  . Osteoporosis, unspecified 11/08/2012   Past Medical History:  Past Medical History:  Diagnosis Date  . AF (paroxysmal atrial fibrillation) (HCC) 08/29/2019  . Atrial fibrillation (HCC)   . Diabetes mellitus without complication (HCC)   . Hypertension   . Mixed diabetic hyperlipidemia associated with type 2 diabetes mellitus (HCC) 08/29/2019  . Osteoporosis   . Retinal vein occlusion   . Type 2 diabetes mellitus without complication, without long-term current use of insulin (HCC) 08/29/2019  . Vitamin D deficiency     Past Surgical History: History reviewed. No pertinent surgical history. HPI:  Pt is a 86 y.o. female, with medical history of paroxysmal atrial fibrillation on chronic anticoagulant with Coumadin, diabetes mellitus type 2, vitamin D deficiency who was brought to the hospital from SNF after patient had slurred speech. Code stroke was activated. CT head was negative. She was outside window for TPA. MRI brain showed 12 mm acute infarct within the right caudate head, 60 mm acute infarct in the right lentiform nucleus. Pt failed the [facility] on 10/31/19   Assessment / Plan / Recommendation Clinical Impression  Pt participated in speech/language/cognition evaluation with her daughter, [name], present for part of the evaluation. Pt denied knowledge of her baseline skills in speech, language, or cognition. Pt's daughter indicated that these skills were WNL until 10 weeks prior when she fell. Per the daughter, since that fall, the pt has demonstrated impairments in memory, attention, initiation of conversation, auditory comprehension, and problem solving. Pt's language and cognitive-linguistic skills appear to be at baseline with deficits in the areas mentioned by the daughter as well as orientation and expected difficulty with executive function. Pt presented with mild-moderate dysarthria characterized by reduced articulatory precision, a hoarse vocal quality, and impaired coordination of respiration with speech which negatively impacted speech intelligibility at the sentence level. Skilled SLP services are clinically indicated at this time to improve motor speech function.    SLP Assessment  SLP Recommendation/Assessment: Patient needs continued Speech Lanaguage Pathology Services SLP Visit Diagnosis: Dysarthria and anarthria (R47.1)    Follow Up Recommendations  Skilled Nursing facility    Frequency and Duration min 2x/week  2 weeks      SLP Evaluation Cognition  Overall Cognitive Status: History of  cognitive impairments - at baseline Arousal/Alertness: Awake/alert Orientation  Level: Oriented to person Attention: Focused;Sustained Focused Attention: Impaired Focused Attention Impairment: Verbal basic Sustained Attention: Impaired Sustained Attention Impairment: Verbal basic Memory: Impaired Memory Impairment: Retrieval deficit;Decreased recall of new information (Immediate: 2/3; delayed: 0/3; with cues: 1/3) Awareness: Impaired Awareness Impairment: Intellectual impairment Problem Solving: Impaired Problem Solving Impairment: Verbal complex Executive Function: Reasoning Reasoning: Impaired Reasoning Impairment: Verbal basic       Comprehension  Auditory Comprehension Overall Auditory Comprehension: Impaired at baseline Yes/No Questions: Impaired Basic Biographical Questions:  (5/5) Complex Questions:  (3/5) Paragraph Comprehension (via yes/no questions):  (2/44) Commands: Impaired Two Step Basic Commands:  (2/4) Multistep Basic Commands:  (0/3) Conversation: Simple Interfering Components: Attention;Processing speed;Working [employer] fast streamer Reading Comprehension Reading Status: Not tested    Expression Expression Primary Mode of Expression: Verbal Verbal Expression Overall Verbal Expression: Impaired at baseline Initiation: Impaired Automatic Speech: Counting;Day of week (WNL with additional processing time. Months: 8/12) Level of Generative/Spontaneous Verbalization: Conversation Repetition: No impairment (5/5) Naming: No impairment Responsive:  (5/5) Confrontation: Within functional limits Convergent:  (5/5) Pragmatics: Impairment Impairments: Abnormal affect   Oral / Motor  Oral Motor/Sensory Function Overall Oral Motor/Sensory Function: Mild impairment Facial ROM: Within Functional Limits Facial Symmetry: Within Functional Limits Facial Strength: Within Functional Limits Facial Sensation: Within Functional Limits Lingual ROM: Reduced left;Suspected CN XII  (hypoglossal) dysfunction Lingual Symmetry: Within Functional Limits Lingual Strength: Reduced;Suspected CN XII (hypoglossal) dysfunction Lingual Sensation: Within Functional Limits Velum: Within Functional Limits Motor Speech Overall Motor Speech: Impaired Respiration: Impaired Level of Impairment: Sentence Phonation: Hoarse;Low vocal intensity Resonance: Within functional limits Articulation: Impaired Level of Impairment: Sentence Intelligibility: Intelligibility reduced Word: 75-100% accurate Phrase: 50-74% accurate Sentence: 50-74% accurate Conversation: 50-74% accurate Motor Planning: Witnin functional limits Motor Speech Errors: Aware;Consistent   Paxtyn Boyar I. Vear Clock, MS, CCC-SLP Acute Rehabilitation Services Office number [phone] Pager [phone]                    Scheryl Marten 11/02/2019, 11:28 AM

## 2019-11-02 NOTE — Progress Notes (Signed)
STROKE TEAM PROGRESS NOTE   INTERVAL HISTORY Daughter at bedside. Pt lying in bed, more awake alert today, following commands, able to name and repeat. Left sided weakness improving. Now passed swallow and on eliquis. Heparin IV discontinued.  PT/OT recommend SNF.   Vitals:   11/02/19 0001 11/02/19 0319 11/02/19 0732 11/02/19 1140  BP: (!) 152/75 130/71 (!) 117/59 (!) 127/55  Pulse: 72 78 80 81  Resp: 19 18 18 18   Temp: 97.9 F (36.6 C) (!) 97.4 F (36.3 C) 97.6 F (36.4 C) 98.2 F (36.8 C)  TempSrc:  Oral Oral Oral  SpO2: 100% 98% 92% 100%  Weight:      Height:       CBC:  Recent Labs  Lab 10/31/19 1338 10/31/19 1342 11/01/19 0327 11/02/19 0221  WBC 11.9*   < > 9.0 9.2  NEUTROABS 9.1*  --   --   --   HGB 11.4*   < > 10.3* 10.8*  HCT 35.8*   < > 33.4* 35.2*  MCV 92.3   < > 89.8 91.2  PLT 271   < > 252 237   < > = values in this interval not displayed.   Basic Metabolic Panel:  Recent Labs  Lab 10/31/19 1338 10/31/19 1342  NA 138 138  K 4.4 4.3  CL 99 98  CO2 27  --   GLUCOSE 119* 113*  BUN 14 17  CREATININE 1.18* 1.20*  CALCIUM 9.1  --    Lipid Panel:  Recent Labs  Lab 11/01/19 0327  CHOL 106  TRIG 81  HDL 37*  CHOLHDL 2.9  VLDL 16  LDLCALC 53   HgbA1c:  Recent Labs  Lab 11/01/19 0327  HGBA1C 6.1*   Urine Drug Screen:  Recent Labs  Lab 10/31/19 1930  LABOPIA NONE DETECTED  COCAINSCRNUR NONE DETECTED  LABBENZ NONE DETECTED  AMPHETMU NONE DETECTED  THCU NONE DETECTED  LABBARB NONE DETECTED    Alcohol Level  Recent Labs  Lab 10/31/19 1338  ETH <10    IMAGING past 24 hours No results found.  PHYSICAL EXAM   Temp:  [97.4 F (36.3 C)-98.6 F (37 C)] 98.2 F (36.8 C) (07/15 1140) Pulse Rate:  [68-81] 81 (07/15 1140) Resp:  [16-19] 18 (07/15 1140) BP: (116-152)/(55-75) 127/55 (07/15 1140) SpO2:  [92 %-100 %] 100 % (07/15 1140)  General - Well nourished, well developed, not in acute distress.  Ophthalmologic - fundi not  visualized due to noncooperation.  Cardiovascular - irregularly irregular heart rate and rhythm.  Neuro - awake alert eyes opne.  Orientated to self and people, but not age, place or time.  Mild to moderate dysarthria.  Able to name and repeat.  Right gaze complete, left gaze incomplete.  However able to tracking bilaterally.  Blinking to visual threat bilaterally.  PERRL.  Left mild facial droop.  Right upper extremity 4/5, left upper extremity 3+/5 with no drift.  Left lower extremity 3-/5 with pain stimulation, right lower extremity 2+/5 with painful stimulation which has been her baseline for the last couple months due to fracture. Sensation symmetrical subjectively, b/l FTN intact but slow. Gait not tested.   ASSESSMENT/PLAN Ms. [name] is a 86 y.o. female with history of paroxysmal atrial fibrillation on Coumadin, diabetes, hypertension, retinal vein occlusion, currently in a SNF s/p RLE tibial plateau fracture with recent admission for altered mental status likely secondary to UTI versus worsening dementia, brought in for concerns for worsening slurred speech. 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 Old B thalamic, L caudate head, posterior R cerebellar infarcts. ASPECTS 10.     MRI  R caudate head infarct. R lentiform nucleus infarct. Small vessel disease. Atrophy. Old infarcts as above.  MRA  Distal R ICA terminus occlusion, right M1 occlusion wheeze minimal M2 reconstitution, paucity of blood flow right A1, B V4 moderate/severe focal stenosis   CTA head & neck R ICA terminus embolus w/ some flow in R ACA and MCA territories. B ICA bifurcation atherosclerosis. L VA no flow. Retrograde flow L VA back to PICA.   2D Echo EF  60-65%. No source of embolus. LA moderate dilated.  LDL 53  HgbA1c 6.1  VTE prophylaxis - IV heparin / warfarin  warfarin daily prior to admission w/ INR 1.3 on arrival, treated w/ heparin IV and warfarin, now changed to eliquis   Therapy recommendations:  SNF  Disposition:  Return to SNF (admission from SNF for RLE tibial plateau fracture)  Atrial Fibrillation  Home anticoagulation:  warfarin daily   INR on arrival 1.3  Treated in hospital w/ Warfarin hospital along w/ IV heparin   Now transitioned to eliquis   Last INR 1.4   Right ICA occlusion  MRA and CTA confirmed right ICA terminus occlusion  Pt not candidate for any intervention at this time  Will do permissive hypertension  Avoid low BP  Long term BP goal 130-150.   Hypertension  Stable . Permissive hypertension (OK if < 180/105) but gradually normalize in 5-7 days . Long-term BP goal 130-150 given right ICA occlusion  Hyperlipidemia  Home meds:  lipitor 10, resumed in hospital  LDL 53, goal < 70  Continue moderate intensity statin given LDL < 70  Continue statin at discharge  Diabetes type II Controlled Hypoglycemia  HgbA1c 6.1, goal < 7.0  CBGs  SSI  Glucose 64->82->88...>108->136->194  On diet now  Dysphagia, resolved . Secondary to stroke . Speech on board . Cleared for D3 thin liquids . Off IVF due to wheezing   UTI  UA WBC > 50  Urine culture pending    on rocephin 7/14>>  Other Stroke Risk Factors  Advanced age  Obesity, Body mass index is 32.11 kg/m., recommend weight loss, diet and exercise as appropriate   Other Active Problems  Hx retinal vein occlusion   RLE tibial plateau fracture in SNF for rehab  Acquired thrombophilia, high risk blood clots    Hospital day # 2  Neurology will sign off. Please call with questions. Pt will follow up with stroke clinic NP at [facility] in about 4 weeks. Thanks for the consult.   Marvel Plan, MD PhD Stroke  Neurology 11/02/2019 1:11 PM  To contact Stroke Continuity provider, please refer to [email or web address]. After hours, contact General Neurology

## 2019-11-03 LAB — GLUCOSE, CAPILLARY
Glucose-Capillary: 148 mg/dL — ABNORMAL HIGH (ref 70–99)
Glucose-Capillary: 167 mg/dL — ABNORMAL HIGH (ref 70–99)

## 2019-11-03 LAB — CBC
HCT: 33.8 % — ABNORMAL LOW (ref 36.0–46.0)
Hemoglobin: 10.4 g/dL — ABNORMAL LOW (ref 12.0–15.0)
MCH: 27.9 pg (ref 26.0–34.0)
MCHC: 30.8 g/dL (ref 30.0–36.0)
MCV: 90.6 fL (ref 80.0–100.0)
Platelets: 218 10*3/uL (ref 150–400)
RBC: 3.73 MIL/uL — ABNORMAL LOW (ref 3.87–5.11)
RDW: 15.3 % (ref 11.5–15.5)
WBC: 9.2 10*3/uL (ref 4.0–10.5)
nRBC: 0 % (ref 0.0–0.2)

## 2019-11-03 MED ORDER — SENNOSIDES-DOCUSATE SODIUM 8.6-50 MG PO TABS: 1.0000 | ORAL_TABLET | Freq: Every evening | ORAL | Status: AC | PRN

## 2019-11-03 MED ORDER — APIXABAN 5 MG PO TABS: 5.0000 mg | ORAL_TABLET | Freq: Two times a day (BID) | ORAL | 4 refills | Status: AC

## 2019-11-03 NOTE — Progress Notes (Signed)
Pt's IVs removed; site is clean, dry and intact. Discharge instructions reviewed with pt's daughter; voiced understanding. Personal belongings packed by pt's. daughter. Pt. transferred via stretcher to [facility] via [facility]. Reported called to Sharlyne Pacas, LPN at receiving facility.

## 2019-11-03 NOTE — Progress Notes (Signed)
  Speech Language Pathology Treatment: Dysphagia  Patient Details Name: Susan Potter MRN: [id number] DOB: 21-Feb-1927 Today's Date: 11/03/2019 Time: 3846-6599 SLP Time Calculation (min) (ACUTE ONLY): 30 min  Assessment / Plan / Recommendation Clinical Impression  Pt seen for skilled ST intervention targeting goals for diet tolerance. Pt was observed during lunch with finely chopped and soft solids with thin liquids. Pt required significant verbal and tactile stimulation to awaken from a nap. She kept her eyes closed throughout this session. Daughter was present for part of this session. Pt exhibits extended oral prep with fish, broccoli, and macaroni and cheese. This increases risk for fatigue, decreased po intake, and increased aspiration risk. Recommend downgrading diet to dys 2 (finely chopped), primarily for energy conservation. Solids may be advanced at bedside as pt strength and endurance improve. Pt is discharging to SNF today. Recommend continued ST intervention for speech and swallowing, and to continue education.    HPI HPI: Pt is a 86 y.o. female, with medical history of paroxysmal atrial fibrillation on chronic anticoagulant with Coumadin, diabetes mellitus type 2, vitamin D deficiency who was brought to the hospital from SNF after patient had slurred speech. Code stroke was activated. CT head was negative. She was outside window for TPA. MRI brain showed 12 mm acute infarct within the right caudate head, 60 mm acute infarct in the right lentiform nucleus. Pt failed the [facility] on 10/31/19      SLP Plan  Continue with current plan of care       Recommendations  Diet recommendations: Dysphagia 2 ( finely chopped soft);Thin liquid Liquids provided via: Cup;Straw Medication Administration: Whole meds with puree Supervision: Staff to assist with self feeding;Full supervision/cueing for compensatory strategies Compensations: Slow rate;Follow solids with liquid;Small  sips/bites Postural Changes and/or Swallow Maneuvers: Seated upright 90 degrees                Oral Care Recommendations: Oral care BID Follow up Recommendations: Skilled Nursing facility;24 hour supervision/assistance SLP Visit Diagnosis: Dysphagia, unspecified (R13.10) Plan: Continue with current plan of care       GO               Sheng Pritz B. Murvin Natal, [facility] CCC-SLP Speech Language Pathologist Office: [phone] Pager: [phone]  Leigh Aurora 11/03/2019, 12:57 PM

## 2019-11-03 NOTE — Progress Notes (Signed)
Physical Therapy Treatment Patient Details Name: Susan Potter MRN: [id number] DOB: Aug 13, 1926 Today's Date: 11/03/2019    History of Present Illness Pt is a 86 y.o. female with PMH of HTN, a fib, Typle 2 DM, osteoporosis, R tibial plateau fx, AMS, and hx of falls presenting to ED from SNF with difficulty with speech and weakness. MRI brain showed acute infarcts within the right caudate head and right lentiform nucleus.    PT Comments    Pt received in bed, daughter present in room. Pt very lethargic today. Daughter reports she has been sleeping all morning. Pt transitioned to EOB +2 max assist. Pt with eyes open once sitting up, but continuing to dose off. She required mod assist to maintain sitting balance. Attempted sit to stand with RW +2 max assist. Pt only able to clear bottom a couple inches from bed. Pt too lethargic for transfer to recliner. Returned to supine in bed at end of session.   Follow Up Recommendations  SNF;Supervision/Assistance - 24 hour     Equipment Recommendations  None recommended by PT    Recommendations for Other Services       Precautions / Restrictions Precautions Precautions: Fall    Mobility  Bed Mobility Overal bed mobility: Needs Assistance Bed Mobility: Supine to Sit;Sit to Supine     Supine to sit: +2 for physical assistance;Max assist Sit to supine: +2 for physical assistance;Max assist   General bed mobility comments: helicopter method using bed pad to transition to/from EOB  Transfers Overall transfer level: Needs assistance Equipment used: Rolling walker (2 wheeled) Transfers: Sit to/from Stand Sit to Stand: Total assist;+2 physical assistance         General transfer comment: Only able to clear bottom a couple inches off bed, unable to attain full stance. Bed pad used to lift hips.  Ambulation/Gait             General Gait Details: unable   Stairs             Wheelchair Mobility    Modified Rankin  (Stroke Patients Only) Modified Rankin (Stroke Patients Only) Pre-Morbid Rankin Score: Moderately severe disability Modified Rankin: Severe disability     Balance Overall balance assessment: Needs assistance Sitting-balance support: Feet supported;Single extremity supported Sitting balance-Leahy Scale: Poor Sitting balance - Comments: reliant on external assist                                    Cognition Arousal/Alertness: Lethargic Behavior During Therapy: Flat affect Overall Cognitive Status: Impaired/Different from baseline Area of Impairment: Memory;Problem solving;Orientation;Attention;Following commands;Safety/judgement;Awareness                 Orientation Level: Disoriented to;Situation;Time Current Attention Level: Focused Memory: Decreased short-term memory;Decreased recall of precautions Following Commands: Follows one step commands inconsistently;Follows one step commands with increased time Safety/Judgement: Decreased awareness of deficits Awareness: Intellectual Problem Solving: Slow processing;Decreased initiation;Requires verbal cues;Requires tactile cues        Exercises      General Comments        Pertinent Vitals/Pain Pain Assessment: Faces Faces Pain Scale: No hurt    Home Living                      Prior Function            PT Goals (current goals can now be found in the care plan section)  Acute Rehab PT Goals Patient Stated Goal: return to SNF and then home Progress towards PT goals: Not progressing toward goals - comment (lethargy/fatigue)    Frequency    Min 3X/week      PT Plan Current plan remains appropriate    Co-evaluation              AM-PAC PT "6 Clicks" Mobility   Outcome Measure  Help needed turning from your back to your side while in a flat bed without using bedrails?: A Lot Help needed moving from lying on your back to sitting on the side of a flat bed without using  bedrails?: A Lot Help needed moving to and from a bed to a chair (including a wheelchair)?: Total Help needed standing up from a chair using your arms (e.g., wheelchair or bedside chair)?: Total Help needed to walk in hospital room?: Total Help needed climbing 3-5 steps with a railing? : Total 6 Click Score: 8    End of Session Equipment Utilized During Treatment: Gait belt Activity Tolerance: Patient limited by lethargy;Patient limited by fatigue Patient left: in bed;with call bell/phone within reach;with bed alarm set;with family/visitor present Nurse Communication: Mobility status PT Visit Diagnosis: Difficulty in walking, not elsewhere classified (R26.2);Muscle weakness (generalized) (M62.81);Unsteadiness on feet (R26.81);History of falling (Z91.81)     Time: 6812-7517 PT Time Calculation (min) (ACUTE ONLY): 13 min  Charges:  $Therapeutic Activity: 8-22 mins                     Aida Raider, PT  Office # [phone] Pager [phone]    Ilda Foil 11/03/2019, 11:53 AM

## 2019-11-03 NOTE — TOC Transition Note (Signed)
Transition of Care [facility]) - CM/SW Discharge Note   Patient Details  Name: Susan Potter MRN: [id number] Date of Birth: December 15, 1926  [facility] CM/SW Contact:  Baldemar Lenis, LCSW Phone Number: 11/03/2019, 1:24 PM   Clinical Narrative:   Nurse to call report to [phone], Room 208    Final next level of care: Skilled Nursing Facility Barriers to Discharge: Barriers Resolved   Patient Goals and CMS Choice Patient states their goals for this hospitalization and ongoing recovery are:: patient unable to participate in goal setting due to disorientation CMS Medicare.gov Compare Post Acute Care list provided to:: Patient Represenative (must comment) Choice offered to / list presented to : Adult Children  Discharge Placement              Patient chooses bed at: [facility], [place] Patient to be transferred to facility by: [facility] Name of family member notified: Daughter Patient and family notified of of transfer: 11/03/19  Discharge Plan and Services     Post Acute Care Choice: Skilled Nursing Facility                               Social Determinants of Health (SDOH) Interventions     Readmission Risk Interventions No flowsheet data found.

## 2019-11-03 NOTE — Discharge Summary (Signed)
Physician Discharge Summary  Susan Potter OMB:[id number] DOB: 03-31-1927 DOA: 10/31/2019  PCP: Susan Snare, MD  Admit date: 10/31/2019 Discharge date: 11/03/2019  Admitted From: Nursing Home Disposition: Nursing Home Recommendations for Outpatient Follow-up:  1. Follow up with PCP in 1-2 weeks 2. Please obtain BMP/CBC in one week 3. Please follow up with [facility] neurology 4. Patient will benefit from palliative care follow-up at the facility.  Home Health: None Equipment/Devices none Discharge Condition: Improved CODE STATUS DNR Diet recommendation: Cardiac carb modified Brief/Interim Summary:NarrativeEvaSaintsingis a86 y.o.female,with medical history of paroxysmal atrial fibrillation on chronic anticoagulant with Coumadin, diabetes mellitus type 2, vitamin D deficiency who was brought to the hospital from skilled facility after patient had slurred speech at skilled nursing facility around 9 AM. When paramedics arrived she was noted to have some difficulty with speaking but otherwise seem to be at baseline. Patient was brought to the ED, did not have any neurological deficits on exam. Code stroke was activated. CT head was negative. She was outside window for TPA. MRI brain showed 12 mm acute infarct within the right caudate head, 60 mm acute infarct in the right lentiform nucleus. Patient denies chest pain or shortness of breath. Denies nausea vomiting or diarrhea   Discharge Diagnoses:  Active Problems:   CVA (cerebral vascular accident) (Susan Potter)   #1 acute stroke with left-sided weakness -patient with history of atrial fibrillation and was on chronic anticoagulation with Coumadin with subtherapeutic INR.   Work-up included MRI of the brain-12 mm acute infarct within the right caudate, 16 mm acute infarct within the right lentiform nucleus.  Generalized atrophy mild to moderate chronic small vessel ischemic changes and chronic lacunar infarcts are present within the  bilateral thalamus and left basal ganglia and right cerebellum.  CT angiogram of the head and neck embolus at the right carotid terminus slow flow visible in the right and ACA and MCA territories particularly MCA territory.  Nonstenotic atherosclerotic disease at both carotid bifurcations.  No flow in the left vertebral artery at the foramen magnum level.  Echo ejection fraction 60 to 65%.  No wall motion abnormalities.  Right regular function is normal.  Normal pulmonary artery systolic pressure. LDL 53. Hemoglobin A1c 6.1.  Patient started on Eliquis Coumadin stopped.  Seen by PT OT and speech therapy.  Recommending SNF.  Patient placed on dysphagia 3 diet.  Discussed with patient's daughter Susan Potter.  #2 chronic atrial fibrillation paroxysmal rate controlled-started on Eliquis and Coumadin stopped.  #3 type 2 diabetes on Lantus. CBG (last 3)  Recent Labs (last 2 labs)        Recent Labs    11/01/19 2126 11/02/19 0613 11/02/19 1120  GLUCAP 115* 136* 194*     Continue Lantus cautiously since her p.o. intake has been poor she was hypoglycemic at one point which has been resolved.  #4 essential hypertension blood pressure 127/55 not on any antihypertensives continue to monitor.    #5 hyperlipidemia on statins.  LDL 53.  #6 acquired thrombophilia patient at high risk for blood clots on Eliquis to prevent further strokes.   Estimated body mass index is 32.11 kg/m as calculated from the following:   Height as of this encounter: '5\' 7"'  (1.702 m).   Weight as of this encounter: 93 kg.  Discharge Instructions  Discharge Instructions    Ambulatory referral to Neurology   Complete by: As directed    Follow up with stroke clinic NP (Susan Potter or Susan Potter, if both not available, consider  Susan Potter, or Susan Potter) at [facility] in about 4 weeks. Thanks.   Diet - low sodium heart healthy   Complete by: As directed    Increase activity slowly   Complete by: As directed       Allergies as of 11/03/2019   No Known Allergies     Medication List    STOP taking these medications   bisoprolol-hydrochlorothiazide 10-6.25 MG tablet Commonly known as: ZIAC   glimepiride 2 MG tablet Commonly known as: AMARYL   indapamide 1.25 MG tablet Commonly known as: LOZOL   Janumet 50-1000 MG tablet Generic drug: sitaGLIPtin-metformin   magnesium oxide 400 MG tablet Commonly known as: MAG-OX   traMADol 50 MG tablet Commonly known as: ULTRAM   warfarin 2 MG tablet Commonly known as: COUMADIN   warfarin 2.5 MG tablet Commonly known as: COUMADIN   warfarin 5 MG tablet Commonly known as: COUMADIN     TAKE these medications   acetaminophen 650 MG CR tablet Commonly known as: TYLENOL Take 650 mg by mouth every 6 (six) hours as needed for pain.   apixaban 5 MG Tabs tablet Commonly known as: ELIQUIS Take 1 tablet (5 mg total) by mouth 2 (two) times daily.   atorvastatin 10 MG tablet Commonly known as: LIPITOR Take 1 tablet (10 mg total) by mouth daily. What changed: additional instructions   Dermacloud Crea Apply 1 application topically in the morning, at noon, and at bedtime. Apply to stomach folds every shift for irritation   ergocalciferol 1.25 MG (50000 UT) capsule Commonly known as: VITAMIN D2 Take 1 capsule (50,000 Units total) by mouth See admin instructions. Take 1 capsule by mouth once every 15 days. What changed: additional instructions   feeding supplement (BOOST BREEZE) Liqd Take 1 Container by mouth daily.   glucagon 1 MG Solr injection Commonly known as: GLUCAGEN Inject 1 mg into the vein once as needed for low blood sugar.   glucose blood test strip One touch Ultra 2 test strips only What changed:   how much to take  how to take this  when to take this   insulin glargine 100 unit/mL Sopn Commonly known as: LANTUS Inject 10 Units into the skin at bedtime.   ONE TOUCH ULTRA SYSTEM KIT w/Device Kit One touch Ultra 2  Glucometer What changed:   how much to take  how to take this  when to take this   pantoprazole 40 MG tablet Commonly known as: PROTONIX Take 1 tablet (40 mg total) by mouth daily. What changed: additional instructions   raloxifene 60 MG tablet Commonly known as: EVISTA Take 1 tablet (60 mg total) by mouth daily. What changed: additional instructions   senna-docusate 8.6-50 MG tablet Commonly known as: Senokot-S Take 1 tablet by mouth at bedtime as needed for moderate constipation.       Follow-up Information    [facility]. Schedule an appointment as soon as possible for a visit in 4 week(s).   Specialty: Neurology Contact information: [address] [place] [phone]             No Known Allergies  Consultations: Neurology Procedures/Studies: CT ANGIO HEAD W OR WO CONTRAST  Result Date: 11/01/2019 CLINICAL DATA:  Follow-up stroke. Right basal ganglia infarctions shown by MRI yesterday. EXAM: CT ANGIOGRAPHY HEAD AND NECK TECHNIQUE: Multidetector CT imaging of the head and neck was performed using the standard protocol during bolus administration of intravenous contrast. Multiplanar CT image reconstructions and MIPs  were obtained to evaluate the vascular anatomy. Carotid stenosis measurements (when applicable) are obtained utilizing NASCET criteria, using the distal internal carotid diameter as the denominator. CONTRAST:  28m OMNIPAQUE IOHEXOL 350 MG/ML SOLN COMPARISON:  MRI and CT studies done yesterday. FINDINGS: CT HEAD FINDINGS Brain: Generalized atrophy. Chronic small-vessel ischemic changes throughout the cerebral hemispheric white matter. Old lacunar infarctions of the basal ganglia and thalami. I do not see newly developing low-density related to the acute right basal ganglia infarctions shown on MRI yesterday. No mass effect or hemorrhage. No hydrocephalus or extra-axial collection. Vascular: There is  atherosclerotic calcification of the major vessels at the base of the brain. Skull: Negative Sinuses: Clear/normal Orbits: Normal Review of the MIP images confirms the above findings CTA NECK FINDINGS Aortic arch: Aortic atherosclerosis. No brachiocephalic origin stenosis of significance. Right carotid system: Common carotid artery widely patent to the bifurcation. Calcified plaque at the carotid bifurcation and ICA bulb but no stenosis. Cervical ICA widely patent beyond that. Left carotid system: Common carotid artery widely patent to the bifurcation. Calcified plaque at the carotid bifurcation and ICA bulb. No stenosis. Cervical ICA widely patent beyond that. Vertebral arteries: Vertebral artery origins not included. Right vertebral artery patent through the cervical region to the foramen magnum. Left vertebral artery is a small vessel which appears to be occluded at the skull base level. Skeleton: Cervical spondylosis. Other neck: No mass or lymphadenopathy. Upper chest: Negative Review of the MIP images confirms the above findings CTA HEAD FINDINGS Anterior circulation: Both internal carotid arteries are patent through the skull base and siphon regions. There is ordinary siphon atherosclerotic calcification but without stenosis greater than 30%. The anterior and middle cerebral vessels on the left are patent. On the right, there is embolic occlusion of the supraclinoid ICA at the carotid terminus. This was visible on the MR angiography yesterday. There is some flow visible in the right ACA and MCA branches. Posterior circulation: Right vertebral artery is patent through the foramen magnum to the basilar. Atherosclerotic plaque at the right vertebral artery V4 segment with stenosis of 30%. No antegrade flow in the left vertebral artery at the foramen magnum. Retrograde flow in the distal left vertebral artery back to PICA. No basilar stenosis. Posterior circulation branch vessels are normal. Primary anterior  circulation supply of both posterior cerebral arteries. Venous sinuses: Patent and normal. Anatomic variants: None significant. Review of the MIP images confirms the above findings IMPRESSION: Embolus at the right carotid terminus. Some flow visible in the right and ACA and MCA territories presently, though reduced, particularly in the MCA territory. Probably similar appearance when compared to the MR angiography of yesterday. One could argue that there is more blood flow in the MCA territory presently, but this is difficult to state with certainty comparing the different techniques. Nonstenotic atherosclerotic disease at both carotid bifurcations. No flow in the left vertebral artery at the foramen magnum level. Retrograde flow in the distal left vertebral artery back to PICA. Findings discussed with Dr. XErlinda Hongduring image transfer at 1030 hours. Electronically Signed   By: MNelson ChimesM.D.   On: 11/01/2019 10:43   DG Chest 2 View  Result Date: 10/31/2019 CLINICAL DATA:  Code stroke.  CVA. EXAM: CHEST - 2 VIEW COMPARISON:  09/18/2019 FINDINGS: Stable upper normal heart size. Unchanged mediastinal contours with aortic atherosclerosis. No acute or focal airspace disease. No pulmonary edema, pleural effusion, or pneumothorax. Chronic degenerative change of the left shoulder. No acute osseous abnormalities are seen. IMPRESSION:  No acute chest findings. Aortic Atherosclerosis (ICD10-I70.0). Electronically Signed   By: Keith Rake M.D.   On: 10/31/2019 20:05   CT ANGIO NECK W OR WO CONTRAST  Result Date: 11/01/2019 CLINICAL DATA:  Follow-up stroke. Right basal ganglia infarctions shown by MRI yesterday. EXAM: CT ANGIOGRAPHY HEAD AND NECK TECHNIQUE: Multidetector CT imaging of the head and neck was performed using the standard protocol during bolus administration of intravenous contrast. Multiplanar CT image reconstructions and MIPs were obtained to evaluate the vascular anatomy. Carotid stenosis measurements  (when applicable) are obtained utilizing NASCET criteria, using the distal internal carotid diameter as the denominator. CONTRAST:  63m OMNIPAQUE IOHEXOL 350 MG/ML SOLN COMPARISON:  MRI and CT studies done yesterday. FINDINGS: CT HEAD FINDINGS Brain: Generalized atrophy. Chronic small-vessel ischemic changes throughout the cerebral hemispheric white matter. Old lacunar infarctions of the basal ganglia and thalami. I do not see newly developing low-density related to the acute right basal ganglia infarctions shown on MRI yesterday. No mass effect or hemorrhage. No hydrocephalus or extra-axial collection. Vascular: There is atherosclerotic calcification of the major vessels at the base of the brain. Skull: Negative Sinuses: Clear/normal Orbits: Normal Review of the MIP images confirms the above findings CTA NECK FINDINGS Aortic arch: Aortic atherosclerosis. No brachiocephalic origin stenosis of significance. Right carotid system: Common carotid artery widely patent to the bifurcation. Calcified plaque at the carotid bifurcation and ICA bulb but no stenosis. Cervical ICA widely patent beyond that. Left carotid system: Common carotid artery widely patent to the bifurcation. Calcified plaque at the carotid bifurcation and ICA bulb. No stenosis. Cervical ICA widely patent beyond that. Vertebral arteries: Vertebral artery origins not included. Right vertebral artery patent through the cervical region to the foramen magnum. Left vertebral artery is a small vessel which appears to be occluded at the skull base level. Skeleton: Cervical spondylosis. Other neck: No mass or lymphadenopathy. Upper chest: Negative Review of the MIP images confirms the above findings CTA HEAD FINDINGS Anterior circulation: Both internal carotid arteries are patent through the skull base and siphon regions. There is ordinary siphon atherosclerotic calcification but without stenosis greater than 30%. The anterior and middle cerebral vessels on the  left are patent. On the right, there is embolic occlusion of the supraclinoid ICA at the carotid terminus. This was visible on the MR angiography yesterday. There is some flow visible in the right ACA and MCA branches. Posterior circulation: Right vertebral artery is patent through the foramen magnum to the basilar. Atherosclerotic plaque at the right vertebral artery V4 segment with stenosis of 30%. No antegrade flow in the left vertebral artery at the foramen magnum. Retrograde flow in the distal left vertebral artery back to PICA. No basilar stenosis. Posterior circulation branch vessels are normal. Primary anterior circulation supply of both posterior cerebral arteries. Venous sinuses: Patent and normal. Anatomic variants: None significant. Review of the MIP images confirms the above findings IMPRESSION: Embolus at the right carotid terminus. Some flow visible in the right and ACA and MCA territories presently, though reduced, particularly in the MCA territory. Probably similar appearance when compared to the MR angiography of yesterday. One could argue that there is more blood flow in the MCA territory presently, but this is difficult to state with certainty comparing the different techniques. Nonstenotic atherosclerotic disease at both carotid bifurcations. No flow in the left vertebral artery at the foramen magnum level. Retrograde flow in the distal left vertebral artery back to PICA. Findings discussed with Dr. XErlinda Hongduring image transfer at  1030 hours. Electronically Signed   By: Nelson Chimes M.D.   On: 11/01/2019 10:43   MR ANGIO HEAD WO CONTRAST  Addendum Date: 10/31/2019   ADDENDUM REPORT: 10/31/2019 19:45 ADDENDUM: These results were called by telephone at the time of interpretation on 10/31/2019 at 7:45 pm to provider [facility] , who verbally acknowledged these results. Electronically Signed   By: Kellie Simmering DO   On: 10/31/2019 19:45   Result Date: 10/31/2019 CLINICAL DATA:  Stroke, follow-up.  EXAM: MRA HEAD WITHOUT CONTRAST TECHNIQUE: Angiographic images of the Circle of Willis were obtained using MRA technique without intravenous contrast. COMPARISON:  Noncontrast brain MRI performed earlier the same day, noncontrast head CT performed earlier the same day FINDINGS: The examination is mild to moderately motion degraded, limiting evaluation for stenoses and for small aneurysms. There is symmetric signal loss within the bilateral internal carotid arteries at the level of the skull base, likely artifactual. There is moderate atherosclerotic narrowing of the cavernous right ICA. There is apparent occlusion of the very distal right ICA terminus. No significant flow-related enhancement is demonstrated within the M1 right middle cerebral artery. Additionally, there is a paucity of flow related enhancement within the proximal A1 right anterior cerebral artery, which may be occluded. The M1 left middle cerebral artery is patent without significant stenosis. No left M2 proximal branch occlusion is identified. The A1 left anterior cerebral artery and more distal anterior cerebral arteries are patent. No intracranial aneurysm is identified. The non dominant V4 left vertebral artery is poorly delineated proximally, which may be due to small vessel size or high-grade stenosis. Moderate/severe focal stenosis within the dominant V4 right vertebral artery (series 1017, image 10). The posterior cerebral arteries are patent bilaterally without definite high-grade proximal stenosis. The posterior cerebral is are predominantly fetal in origin bilaterally. IMPRESSION: Mild to moderately motion degradation examination, limiting evaluation for stenoses and for small aneurysms. Apparent occlusion at the level of the very distal right ICA terminus. There is no significant flow related enhancement within the M1 right middle cerebral artery and there is minimal appreciable flow-related enhancement within the M2 and more distal  right MCA branches. There is also a paucity of flow related enhancement within the proximal A1 right anterior cerebral artery, possibly occluded. Moderate atherosclerotic narrowing of the cavernous right ICA. Moderate/severe focal stenosis within the dominant V4 right vertebral artery. Attenuated appearance of the proximal V4 left vertebral artery, which may be due to small vessel size or high-grade stenosis. Electronically Signed: By: Kellie Simmering DO On: 10/31/2019 19:33   MR BRAIN WO CONTRAST  Result Date: 10/31/2019 CLINICAL DATA:  Neuro deficit, acute, stroke suspected. Additional history obtained from radiology records, slurred speech. EXAM: MRI HEAD WITHOUT CONTRAST TECHNIQUE: Multiplanar, multiecho pulse sequences of the brain and surrounding structures were obtained without intravenous contrast. COMPARISON:  Noncontrast head CT performed earlier the same day. FINDINGS: Brain: Moderate generalized parenchymal atrophy. 12 mm focus of restricted diffusion within the right caudate head consistent with acute infarction. Additional 16 mm acute infarct within the right lentiform nucleus. No evidence of acute infarct elsewhere within the brain. Redemonstrated chronic lacunar infarcts within the bilateral thalami, left basal ganglia and right cerebellar. Background moderate to advanced patchy T2/FLAIR hyperintensity within the cerebral white matter which is nonspecific, but consistent with chronic small vessel ischemic disease. No evidence of intracranial mass. No chronic intracranial blood products. No extra-axial fluid collection. No midline shift. Vascular: No definite loss of proximal large arterial flow voids. Skull and  upper cervical spine: No focal marrow lesion. Sinuses/Orbits: Visualized orbits show no acute finding. No significant paranasal sinus disease or mastoid effusion at the imaged levels. IMPRESSION: Mildly motion degraded examination. 12 mm acute infarct within the right caudate head. 16 mm  acute infarct within the right lentiform nucleus. Background moderate generalized parenchymal atrophy and mild-to-moderate chronic small vessel ischemic disease. Chronic lacunar infarcts are present within the bilateral thalami, left basal ganglia and right cerebellum. Electronically Signed   By: Kellie Simmering DO   On: 10/31/2019 17:21   ECHOCARDIOGRAM COMPLETE  Result Date: 11/01/2019    ECHOCARDIOGRAM REPORT   Patient Name:   Carrell APPLE Widmann Date of Exam: 11/01/2019 Medical Rec #:  [id number]           Height:       67.0 in Accession #:    [id number]          Weight:       205.0 lb Date of Birth:  [birth year]           BSA:          2.044 m Patient Age:    65 years            BP:           118/75 mmHg Patient Gender: F                   HR:           75 bpm. Exam Location:  Inpatient Procedure: 2D Echo Indications:    stroke 434.91  History:        Patient has prior history of Echocardiogram examinations, most                 recent 08/30/2019. Arrythmias:Atrial Fibrillation; Risk                 Factors:Diabetes, Hypertension and Dyslipidemia.  Sonographer:    Jannett Celestine RDCS (AE) Referring Phys: [place]  1. Left ventricular ejection fraction, by estimation, is 60 to 65%. The left ventricle has normal function. The left ventricle has no regional wall motion abnormalities. Left ventricular diastolic parameters are indeterminate.  2. Right ventricular systolic function is normal. The right ventricular size is normal. There is normal pulmonary artery systolic pressure.  3. Left atrial size was moderately dilated.  4. The mitral valve is normal in structure. Trivial mitral valve regurgitation. No evidence of mitral stenosis.  5. The aortic valve is tricuspid. Aortic valve regurgitation is mild. No aortic stenosis is present.  6. The inferior vena cava is normal in size with greater than 50% respiratory variability, suggesting right atrial pressure of 3 mmHg. FINDINGS  Left Ventricle: Left  ventricular ejection fraction, by estimation, is 60 to 65%. The left ventricle has normal function. The left ventricle has no regional wall motion abnormalities. The left ventricular internal cavity size was normal in size. There is  no left ventricular hypertrophy. Left ventricular diastolic parameters are indeterminate. Right Ventricle: The right ventricular size is normal. No increase in right ventricular wall thickness. Right ventricular systolic function is normal. There is normal pulmonary artery systolic pressure. The tricuspid regurgitant velocity is 2.36 m/s, and  with an assumed right atrial pressure of 3 mmHg, the estimated right ventricular systolic pressure is 17.7 mmHg. Left Atrium: Left atrial size was moderately dilated. Right Atrium: Right atrial size was normal in size. Pericardium: There is no evidence of pericardial effusion. Mitral Valve: The  mitral valve is normal in structure. Normal mobility of the mitral valve leaflets. Trivial mitral valve regurgitation. No evidence of mitral valve stenosis. Tricuspid Valve: The tricuspid valve is normal in structure. Tricuspid valve regurgitation is trivial. No evidence of tricuspid stenosis. Aortic Valve: The aortic valve is tricuspid. . There is moderate thickening and moderate calcification of the aortic valve. Aortic valve regurgitation is mild. No aortic stenosis is present. There is moderate thickening of the aortic valve. There is moderate calcification of the aortic valve. Pulmonic Valve: The pulmonic valve was normal in structure. Pulmonic valve regurgitation is not visualized. No evidence of pulmonic stenosis. Aorta: The aortic root is normal in size and structure. Venous: The inferior vena cava is normal in size with greater than 50% respiratory variability, suggesting right atrial pressure of 3 mmHg. IAS/Shunts: No atrial level shunt detected by color flow Doppler.  LEFT VENTRICLE PLAX 2D LVIDd:         3.70 cm LVIDs:         2.20 cm LV PW:          1.00 cm LV IVS:        0.90 cm LVOT diam:     1.90 cm LV SV:         61 LV SV Index:   30 LVOT Area:     2.84 cm  LEFT ATRIUM             Index LA diam:        4.10 cm 2.01 cm/m LA Vol (A2C):   68.5 ml 33.52 ml/m LA Vol (A4C):   75.5 ml 36.94 ml/m LA Biplane Vol: 74.3 ml 36.36 ml/m  AORTIC VALVE LVOT Vmax:   104.00 cm/s LVOT Vmean:  85.300 cm/s LVOT VTI:    0.215 m  AORTA Ao Root diam: 3.30 cm MITRAL VALVE                TRICUSPID VALVE MV Area (PHT): 3.03 cm     TR Peak grad:   22.3 mmHg MV Decel Time: 250 msec     TR Vmax:        236.00 cm/s MV E velocity: 118.00 cm/s                             SHUNTS                             Systemic VTI:  0.22 m                             Systemic Diam: 1.90 cm Skeet Latch MD Electronically signed by Skeet Latch MD Signature Date/Time: 11/01/2019/1:43:58 PM    Final    CT HEAD CODE STROKE WO CONTRAST  Result Date: 10/31/2019 CLINICAL DATA:  Code stroke. Slurred speech. Last seen normal 5.5 hours ago. EXAM: CT HEAD WITHOUT CONTRAST TECHNIQUE: Contiguous axial images were obtained from the base of the skull through the vertex without intravenous contrast. COMPARISON:  CT head without contrast 09/18/2019. FINDINGS: Brain: Advanced atrophy and white matter disease is similar the prior study. By lacunar infarcts of the thalami bilaterally are stable. Remote lacunar infarct in the left caudate head is stable. Basal ganglia are otherwise unremarkable. The insular ribbon is normal bilaterally. No acute cortical abnormalities are present. The ventricles are proportionate to the degree of atrophy.  No significant extraaxial fluid collection is present. A remote lacunar infarct is present in the posterior right cerebellum. Vascular: Atherosclerotic calcifications are present within the cavernous internal carotid arteries bilaterally. Calcification is present at the dural margin of both vertebral arteries. No hyperdense vessel is present Skull: Calvarium is  intact. No focal lytic or blastic lesions are present. No significant extracranial soft tissue lesion is present. Sinuses/Orbits: The paranasal sinuses and mastoid air cells are clear. Left lens replacement is present. Globes and orbits are otherwise within normal limits. ASPECTS [facility] Stroke Program Early CT Score) - Ganglionic level infarction (caudate, lentiform nuclei, internal capsule, insula, M1-M3 cortex): 7/7 - Supraganglionic infarction (M4-M6 cortex): 3/3 Total score (0-10 with 10 being normal): 10/10 IMPRESSION: 1. No acute intracranial abnormality or significant interval change. 2. Stable advanced atrophy and white matter disease. 3. Stable remote lacunar infarcts of the thalami bilaterally, left caudate head, and posterior right cerebellum. The above was relayed via text pager to Dr. Rory Percy on 10/31/2019 at 13:51 . Electronically Signed   By: San Morelle M.D.   On: 10/31/2019 13:51    (Echo, Carotid, EGD, Colonoscopy, ERCP)    Subjective:   Discharge Exam: Vitals:   11/03/19 0829 11/03/19 1213  BP: 110/67 131/64  Pulse: 79 82  Resp: 18 18  Temp: 98.3 F (36.8 C) 98.2 F (36.8 C)  SpO2: 100% 95%   Vitals:   11/03/19 0004 11/03/19 0300 11/03/19 0829 11/03/19 1213  BP: 122/74 129/73 110/67 131/64  Pulse: 91 85 79 82  Resp: '20 18 18 18  ' Temp: 99 F (37.2 C) 98.7 F (37.1 C) 98.3 F (36.8 C) 98.2 F (36.8 C)  TempSrc: Axillary Axillary Axillary Axillary  SpO2: 98% 100% 100% 95%  Weight:      Height:        General: Pt is alert, awake, not in acute distress Cardiovascular: RRR, S1/S2 +, no rubs, no gallops Respiratory: CTA bilaterally, no wheezing, no rhonchi Abdominal: Soft, NT, ND, bowel sounds + Extremities: no edema, no cyanosis    The results of significant diagnostics from this hospitalization (including imaging, microbiology, ancillary and laboratory) are listed below for reference.     Microbiology: Recent Results (from the past 240 hour(s))   SARS Coronavirus 2 by RT PCR (hospital order, performed in [facility] hospital lab) Nasopharyngeal Nasopharyngeal Swab     Status: None   Collection Time: 10/31/19  2:24 PM   Specimen: Nasopharyngeal Swab  Result Value Ref Range Status   SARS Coronavirus 2 NEGATIVE NEGATIVE Final    Comment: (NOTE) SARS-CoV-2 target nucleic acids are NOT DETECTED.  The SARS-CoV-2 RNA is generally detectable in upper and lower respiratory specimens during the acute phase of infection. The lowest concentration of SARS-CoV-2 viral copies this assay can detect is 250 copies / mL. A negative result does not preclude SARS-CoV-2 infection and should not be used as the sole basis for treatment or other patient management decisions.  A negative result may occur with improper specimen collection / handling, submission of specimen other than nasopharyngeal swab, presence of viral mutation(s) within the areas targeted by this assay, and inadequate number of viral copies (<250 copies / mL). A negative result must be combined with clinical observations, patient history, and epidemiological information.  Fact Sheet for Patients:   StrictlyIdeas.no  Fact Sheet for Healthcare Providers: BankingDealers.co.za  This test is not yet approved or  cleared by the [place] FDA and has been authorized for detection and/or diagnosis of SARS-CoV-2 by  FDA under an Emergency Use Authorization (EUA).  This EUA will remain in effect (meaning this test can be used) for the duration of the COVID-19 declaration under Section 564(b)(1) of the Act, 21 U.S.C. section 360bbb-3(b)(1), unless the authorization is terminated or revoked sooner.  Performed at [facility], [place] [address] [place], [place] [address]      Labs: BNP (last 3 results) No results for input(s): BNP in the last 8760 hours. Basic Metabolic Panel: Recent Labs  Lab 10/31/19 1338 10/31/19 1342  NA  138 138  K 4.4 4.3  CL 99 98  CO2 27  --   GLUCOSE 119* 113*  BUN 14 17  CREATININE 1.18* 1.20*  CALCIUM 9.1  --    Liver Function Tests: Recent Labs  Lab 10/31/19 1338  AST 14*  ALT 11  ALKPHOS 58  BILITOT 0.6  PROT 6.8  ALBUMIN 2.7*   No results for input(s): LIPASE, AMYLASE in the last 168 hours. No results for input(s): AMMONIA in the last 168 hours. CBC: Recent Labs  Lab 10/31/19 1338 10/31/19 1342 11/01/19 0327 11/02/19 0221 11/03/19 0405  WBC 11.9*  --  9.0 9.2 9.2  NEUTROABS 9.1*  --   --   --   --   HGB 11.4* 11.9* 10.3* 10.8* 10.4*  HCT 35.8* 35.0* 33.4* 35.2* 33.8*  MCV 92.3  --  89.8 91.2 90.6  PLT 271  --  252 237 218   Cardiac Enzymes: No results for input(s): CKTOTAL, CKMB, CKMBINDEX, TROPONINI in the last 168 hours. BNP: Invalid input(s): POCBNP CBG: Recent Labs  Lab 11/02/19 1120 11/02/19 1622 11/02/19 2107 11/03/19 0608 11/03/19 1210  GLUCAP 194* 154* 114* 148* 167*   D-Dimer No results for input(s): DDIMER in the last 72 hours. Hgb A1c Recent Labs    10/31/19 1338 11/01/19 0327  HGBA1C 6.1* 6.1*   Lipid Profile Recent Labs    11/01/19 0327  CHOL 106  HDL 37*  LDLCALC 53  TRIG 81  CHOLHDL 2.9   Thyroid function studies No results for input(s): TSH, T4TOTAL, T3FREE, THYROIDAB in the last 72 hours.  Invalid input(s): FREET3 Anemia work up Recent Labs    11/01/19 2142  VITAMINB12 108*   Urinalysis    Component Value Date/Time   COLORURINE AMBER (A) 10/31/2019 1930   APPEARANCEUR TURBID (A) 10/31/2019 1930   LABSPEC 1.010 10/31/2019 1930   PHURINE 8.0 10/31/2019 1930   GLUCOSEU NEGATIVE 10/31/2019 1930   GLUCOSEU NEGATIVE 09/12/2014 0815   HGBUR SMALL (A) 10/31/2019 1930   BILIRUBINUR NEGATIVE 10/31/2019 1930   KETONESUR NEGATIVE 10/31/2019 1930   PROTEINUR 30 (A) 10/31/2019 1930   UROBILINOGEN 0.2 09/12/2014 0815   NITRITE NEGATIVE 10/31/2019 1930   LEUKOCYTESUR LARGE (A) 10/31/2019 1930   Sepsis  Labs Invalid input(s): PROCALCITONIN,  WBC,  LACTICIDVEN Microbiology Recent Results (from the past 240 hour(s))  SARS Coronavirus 2 by RT PCR (hospital order, performed in [place] hospital lab) Nasopharyngeal Nasopharyngeal Swab     Status: None   Collection Time: 10/31/19  2:24 PM   Specimen: Nasopharyngeal Swab  Result Value Ref Range Status   SARS Coronavirus 2 NEGATIVE NEGATIVE Final    Comment: (NOTE) SARS-CoV-2 target nucleic acids are NOT DETECTED.  The SARS-CoV-2 RNA is generally detectable in upper and lower respiratory specimens during the acute phase of infection. The lowest concentration of SARS-CoV-2 viral copies this assay can detect is 250 copies / mL. A negative result does not preclude SARS-CoV-2 infection  and should not be used as the sole basis for treatment or other patient management decisions.  A negative result may occur with improper specimen collection / handling, submission of specimen other than nasopharyngeal swab, presence of viral mutation(s) within the areas targeted by this assay, and inadequate number of viral copies (<250 copies / mL). A negative result must be combined with clinical observations, patient history, and epidemiological information.  Fact Sheet for Patients:   StrictlyIdeas.no  Fact Sheet for Healthcare Providers: BankingDealers.co.za  This test is not yet approved or  cleared by the [place] FDA and has been authorized for detection and/or diagnosis of SARS-CoV-2 by FDA under an Emergency Use Authorization (EUA).  This EUA will remain in effect (meaning this test can be used) for the duration of the COVID-19 declaration under Section 564(b)(1) of the Act, 21 U.S.C. section 360bbb-3(b)(1), unless the authorization is terminated or revoked sooner.  Performed at [facility], [place] [address] [place], [place] [address]      Time coordinating discharge:  39  minutes  SIGNED:   Georgette Shell, MD  [facility] 11/03/2019, 12:27 PM Pager   If 7PM-7AM, please contact night-coverage [email or web address] Password [email or web address]

## 2019-11-22 ENCOUNTER — Telehealth: Payer: Self-pay | Admitting: Endocrinology

## 2019-11-22 NOTE — Telephone Encounter (Signed)
She needs to be transferred to the hospice attending.  I am not seeing primary care patients now

## 2019-11-22 NOTE — Telephone Encounter (Signed)
Crystal with [facility] requests to be called at ph# [phone] re: Patient is on Hospice-Crystal Would like to know if Dr. Lucianne Muss would be Hospice Attending since he is listed as patient's PCP. If Crystal is unable to answer, please leave a detailed message on her voice mail which is secure.

## 2019-11-22 NOTE — Telephone Encounter (Signed)
Returned Crystal's call and left a detailed VM informing her of Dr. Remus Blake response below.

## 2019-11-22 NOTE — Telephone Encounter (Signed)
Please review question below and advise

## 2019-11-23 ENCOUNTER — Ambulatory Visit: Payer: Self-pay | Admitting: General Practice

## 2019-12-05 ENCOUNTER — Telehealth: Payer: Self-pay | Admitting: Adult Health

## 2019-12-05 ENCOUNTER — Inpatient Hospital Stay: Payer: Self-pay | Admitting: Adult Health

## 2019-12-05 NOTE — Telephone Encounter (Signed)
Noted! Thank you

## 2019-12-05 NOTE — Telephone Encounter (Signed)
Just an FYI  Patient daughter called stating her mother the patient is in hospice care now and will no longer be a patient here.

## 2020-01-19 DEATH — deceased

## 2021-05-28 IMAGING — CT CT CERVICAL SPINE W/O CM
3 series · 13 of 27 positions shown, 16 images · non-contrast
Comparison: None.

CLINICAL DATA: Neck pain.  Fall.

EXAM:
CT CERVICAL SPINE WITHOUT CONTRAST
TECHNIQUE: Multidetector CT imaging of the cervical spine was performed without
intravenous contrast. Multiplanar CT image reconstructions were also
generated.

[Series 5: c spine soft · axial · 0.33mm/px · z∈[+963,+1023]mm · 3 of 90 slices shown]
[im 15/90  soft-tissue]
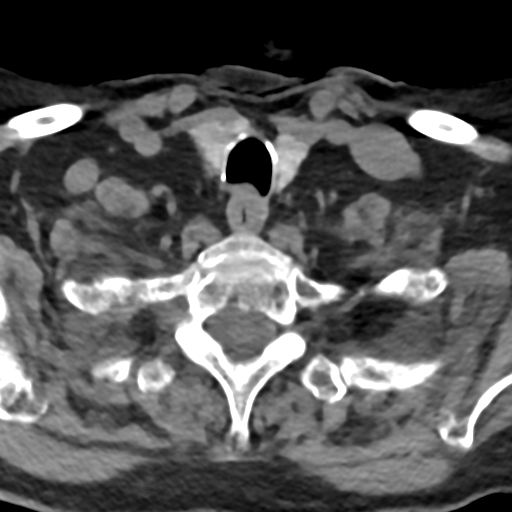
[im 30/90  soft-tissue]
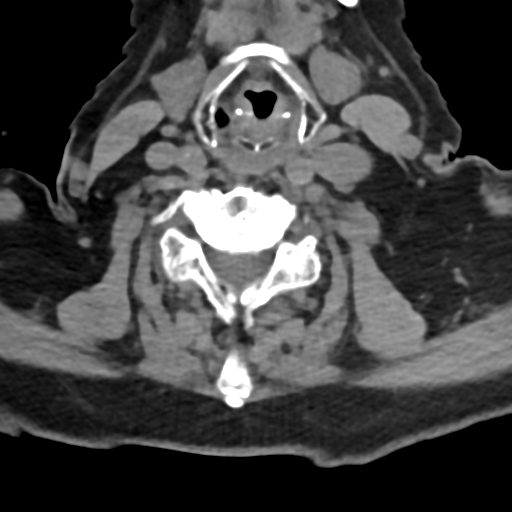
[im 45/90  soft-tissue]
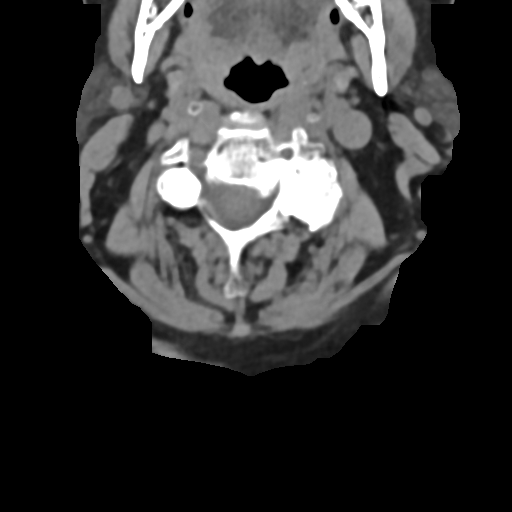

[Series 8: sag bone · sagittal · 0.28mm/px · 5 of 75 slices shown, 6 images]
[im 25/75  bone]
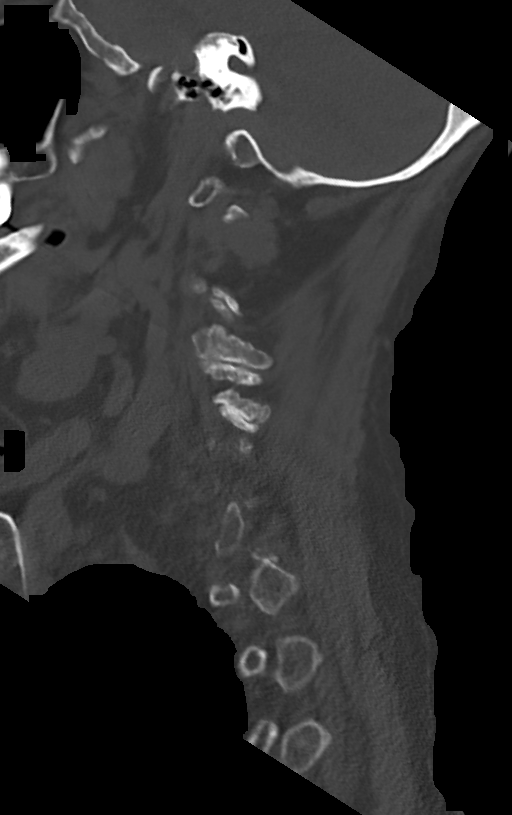
[im 31/75  bone]
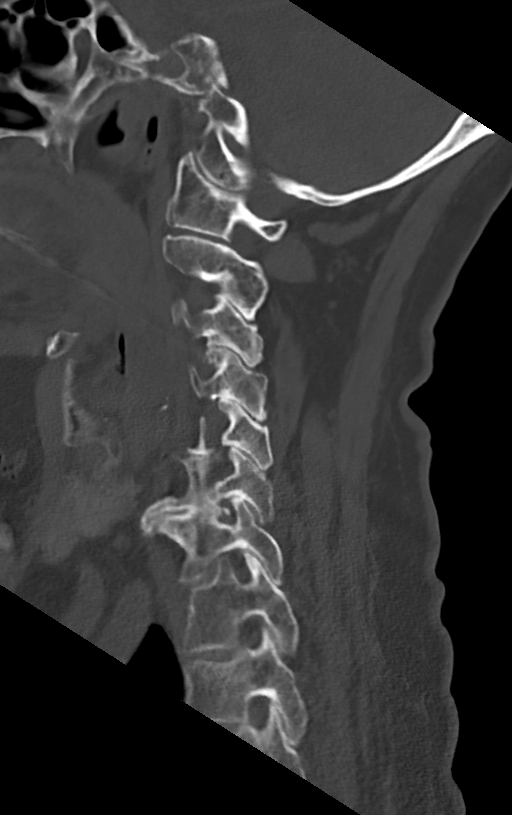
[im 38/75  soft-tissue]
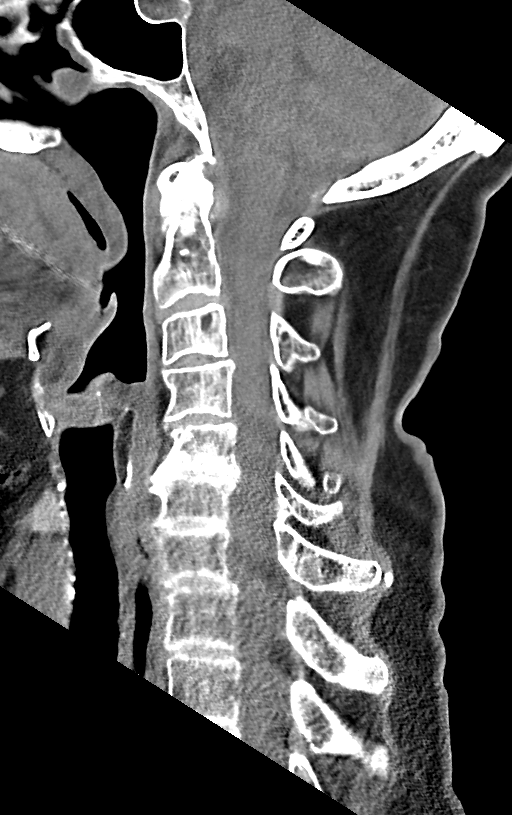
[im 38/75  bone]
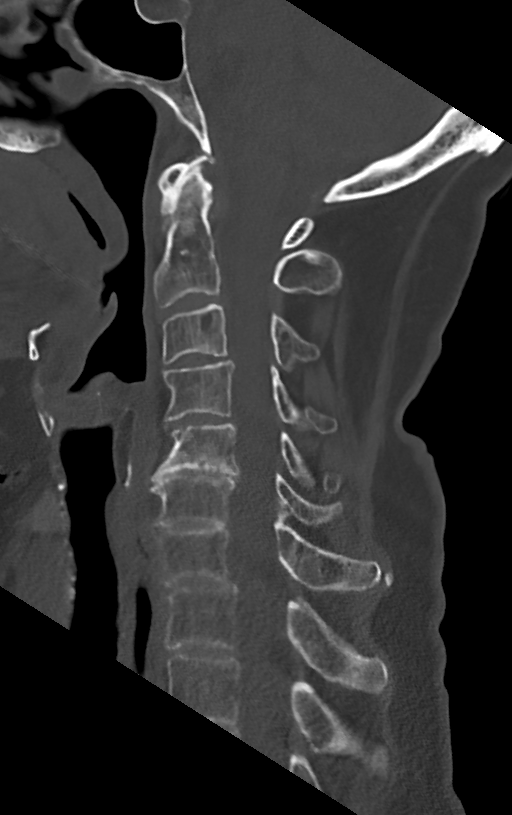
[im 44/75  bone]
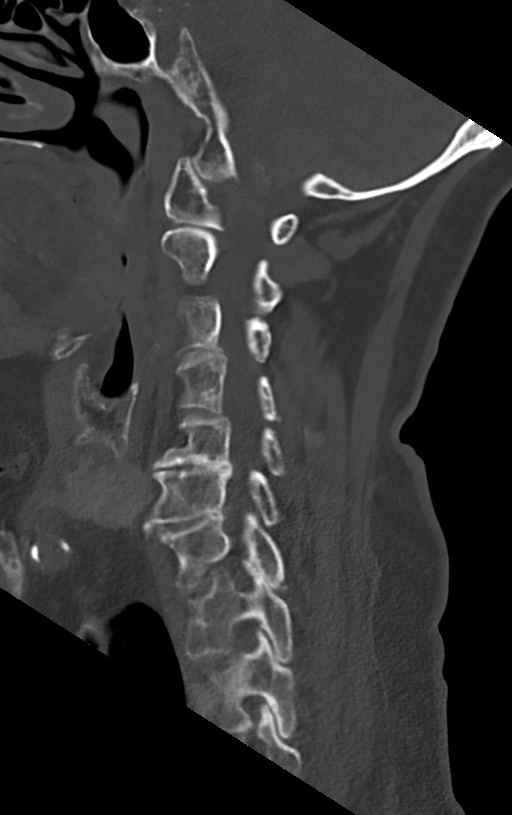
[im 50/75  bone]
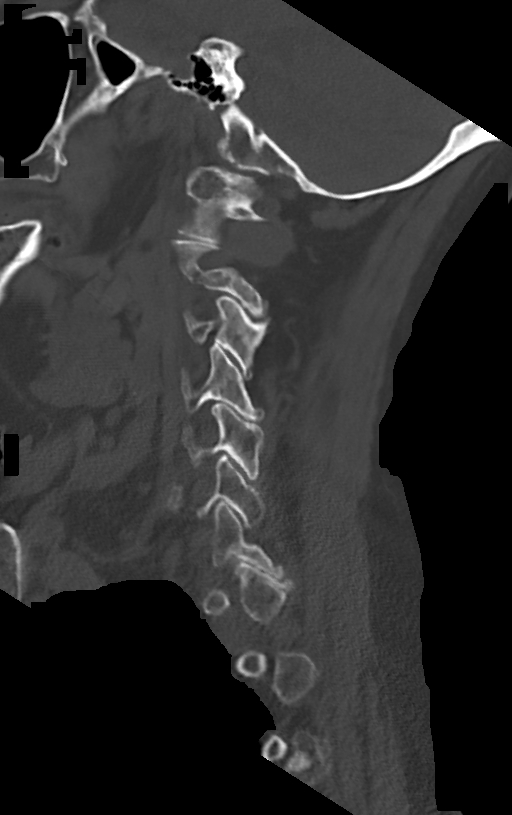

[Series 10: orthogonal axials · axial · 0.21mm/px · z∈[+941,+1035]mm · 5 of 86 slices shown, 7 images]
[im 15/86  soft-tissue]
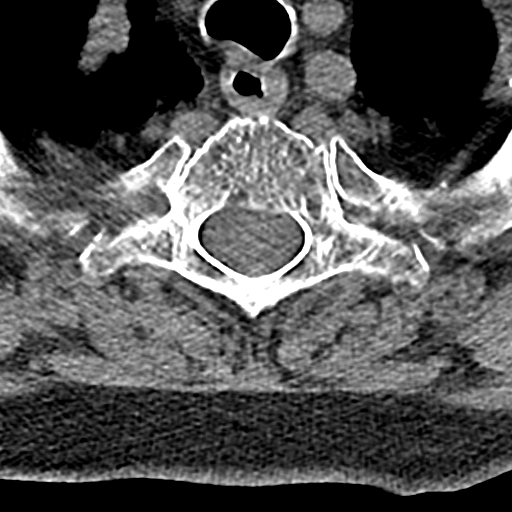
[im 15/86  bone]
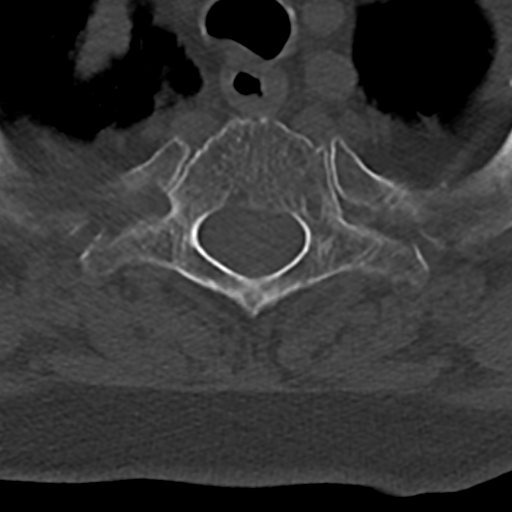
[im 29/86  bone]
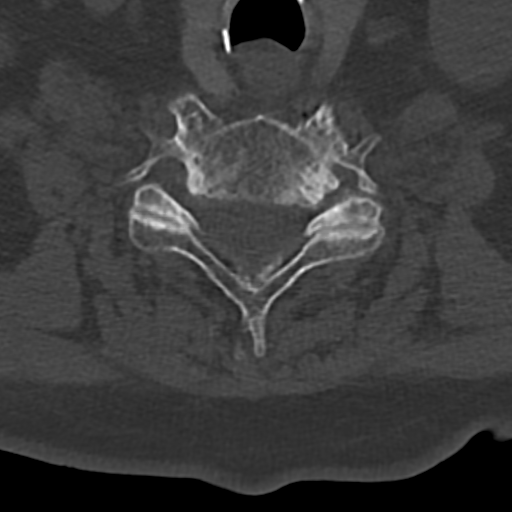
[im 43/86  bone]
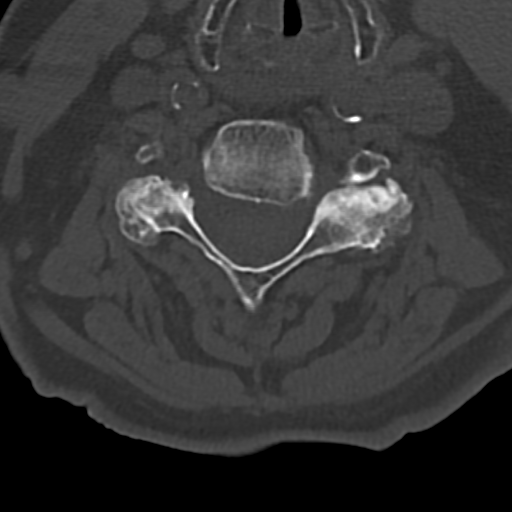
[im 57/86  bone]
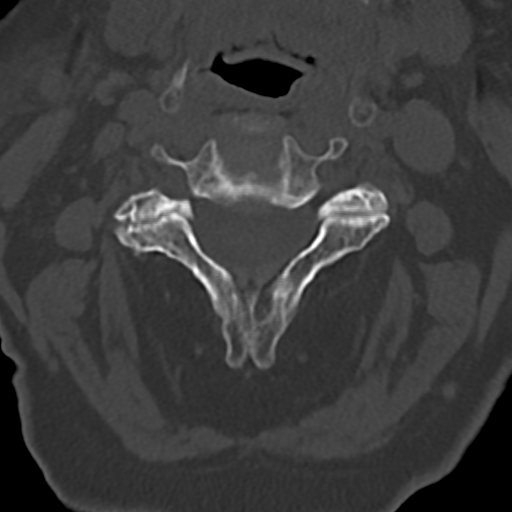
[im 71/86  soft-tissue]
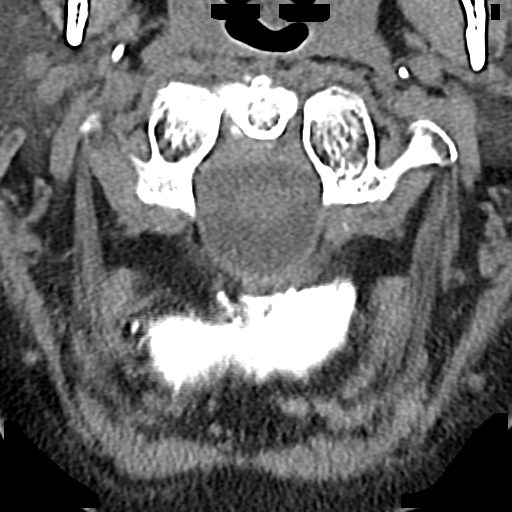
[im 71/86  bone]
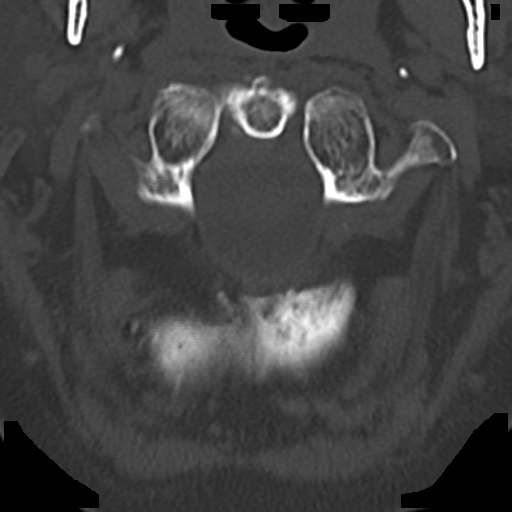

[13 of 27 positions shown; findings below may reference images not displayed]

FINDINGS: Alignment: No subluxation

Skull base and vertebrae: No acute fracture. No primary bone lesion
or focal pathologic process.

Soft tissues and spinal canal: No prevertebral fluid or swelling. No
visible canal hematoma.

Disc levels: Disc space narrowing most pronounced at C5-6. Diffuse
bilateral degenerative facet disease.

Upper chest: No acute findings

Other: None
IMPRESSION: Degenerative disc and facet disease as above. No acute bony
abnormality.

## 2021-05-28 IMAGING — CT CT KNEE*R* W/O CM
3 series · 16 of 33 positions shown, 19 images · non-contrast
Comparison: Right knee radiographs, same date

CLINICAL DATA: Fell. Tibial plateau fracture.

EXAM:
CT OF THE right KNEE WITHOUT CONTRAST
TECHNIQUE: Multidetector CT imaging of the right knee was performed according
to the standard protocol. Multiplanar CT image reconstructions were
also generated.

[Series 4: right knee 3.0 b40s · axial · 0.45mm/px · z∈[-720,-540]mm · 8 of 72 slices shown, 10 images]
[im 6/72  soft-tissue]
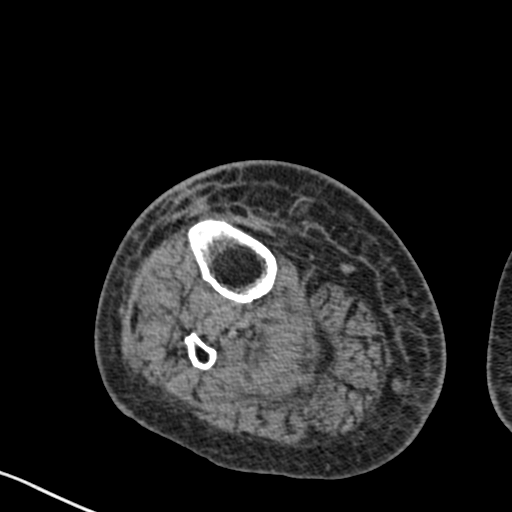
[im 6/72  bone]
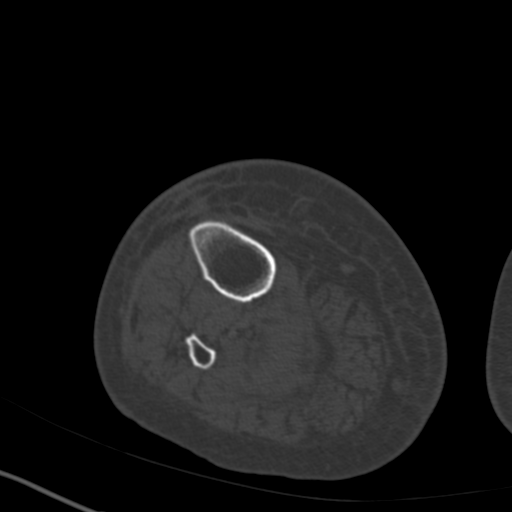
[im 17/72  bone]
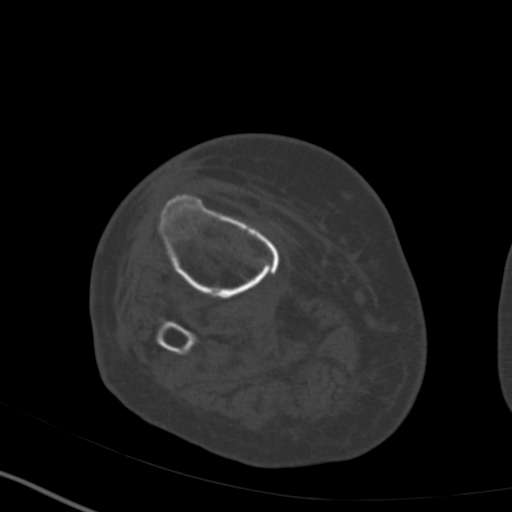
[im 22/72  bone]
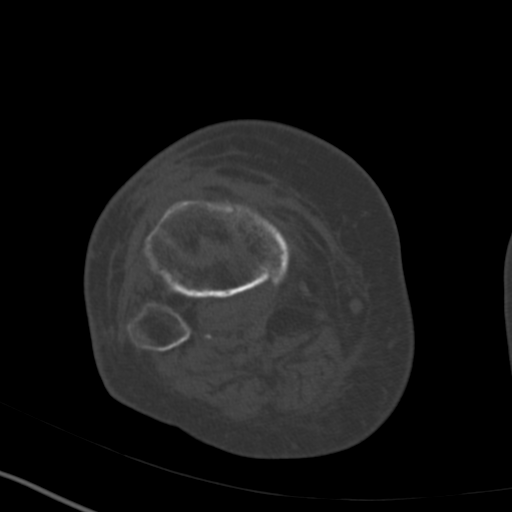
[im 33/72  bone]
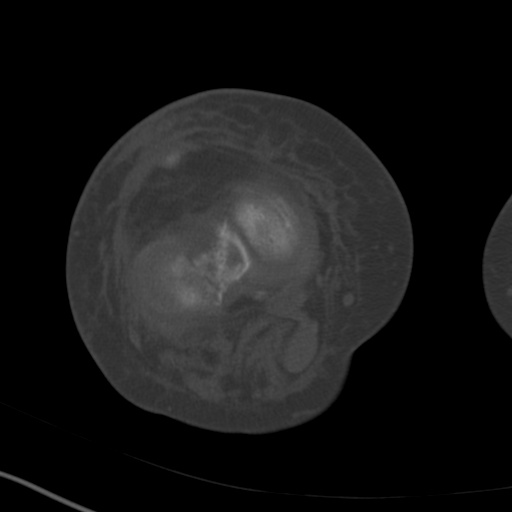
[im 39/72  soft-tissue]
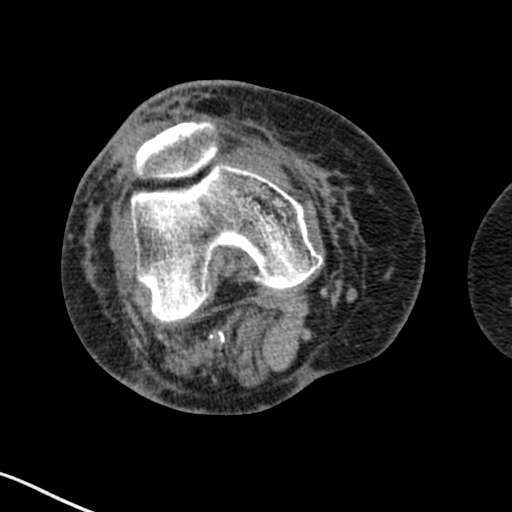
[im 39/72  bone]
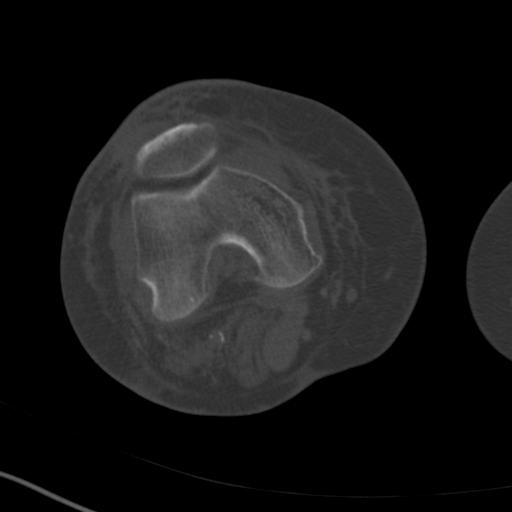
[im 50/72  bone]
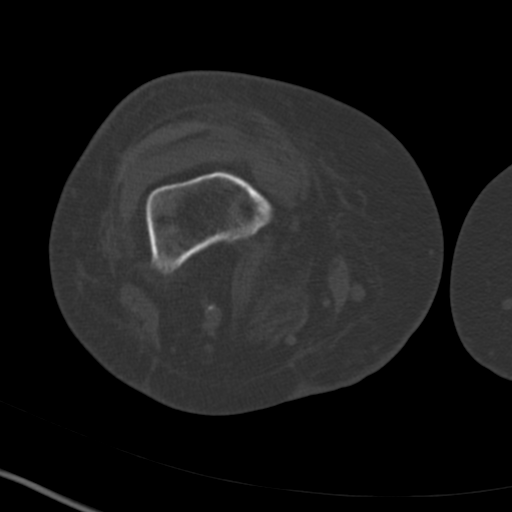
[im 55/72  bone]
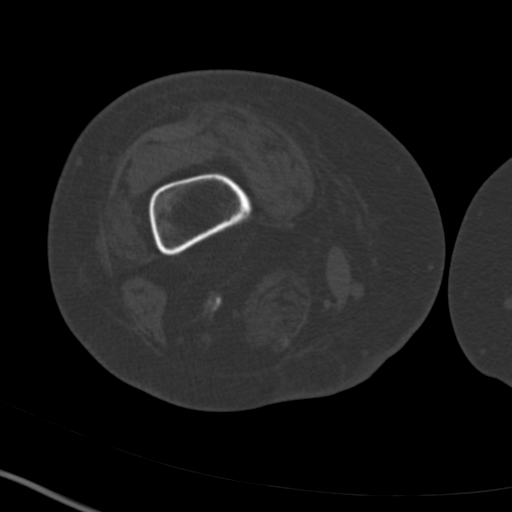
[im 66/72  bone]
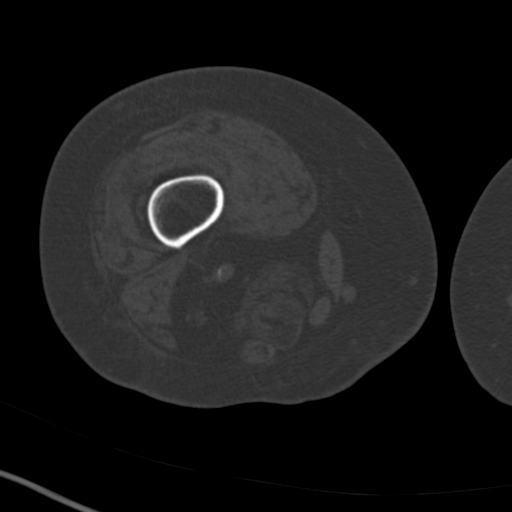

[Series 7: coronalsoft tissue · coronal · 0.42mm/px · 3 of 105 slices shown]
[im 21/105  bone]
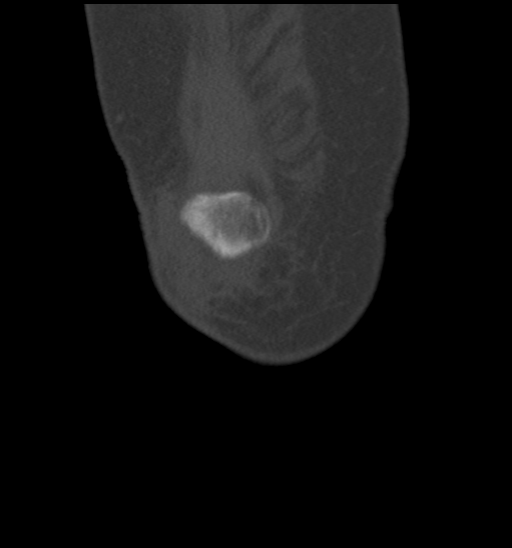
[im 42/105  bone]
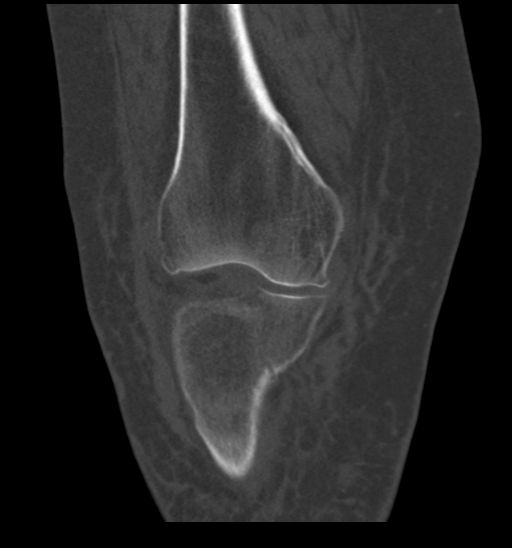
[im 63/105  bone]
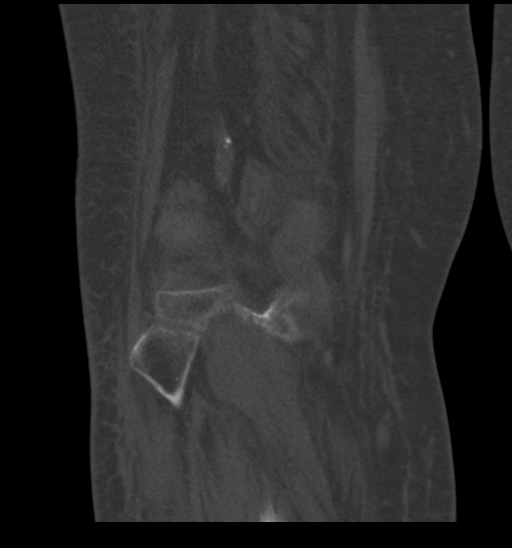

[Series 8: sagittalsoft tissue · sagittal · 0.45mm/px · 5 of 104 slices shown, 6 images]
[im 35/104  bone]
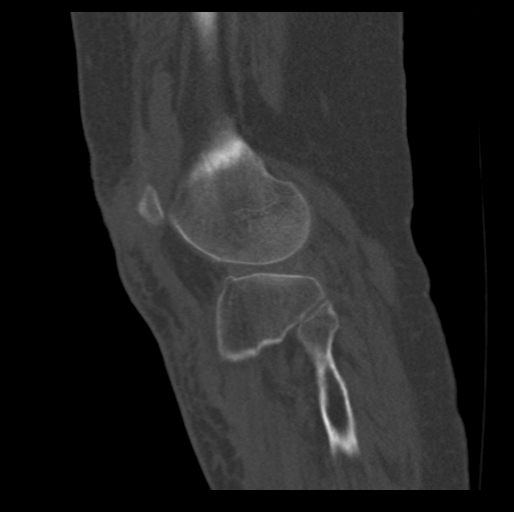
[im 43/104  bone]
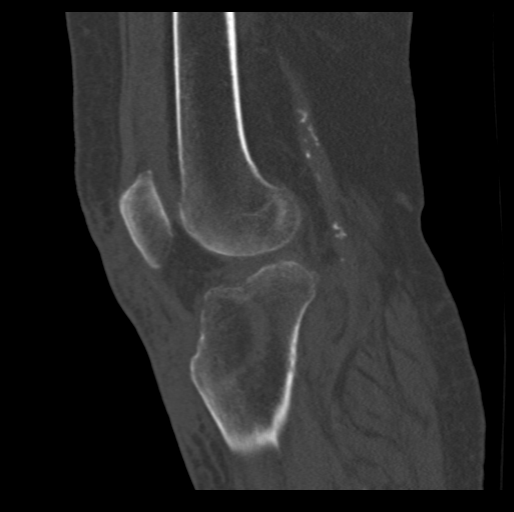
[im 52/104  soft-tissue]
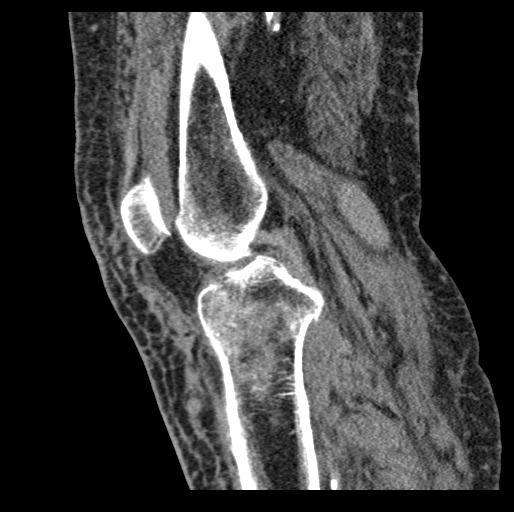
[im 52/104  bone]
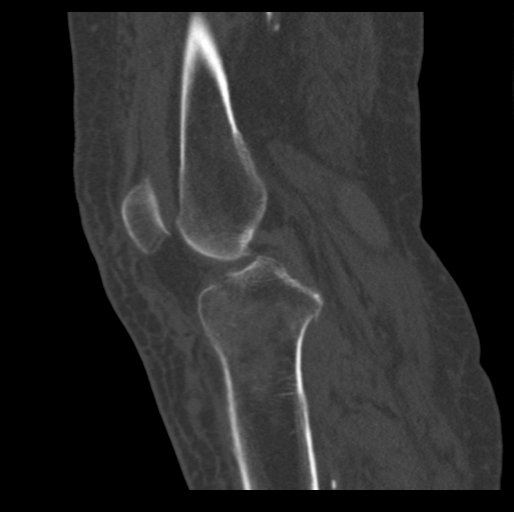
[im 61/104  bone]
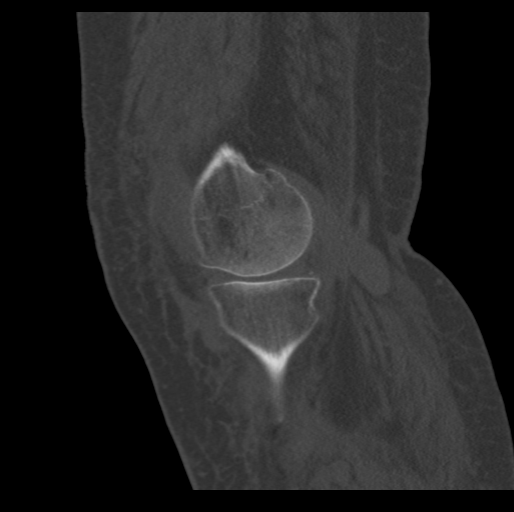
[im 69/104  bone]
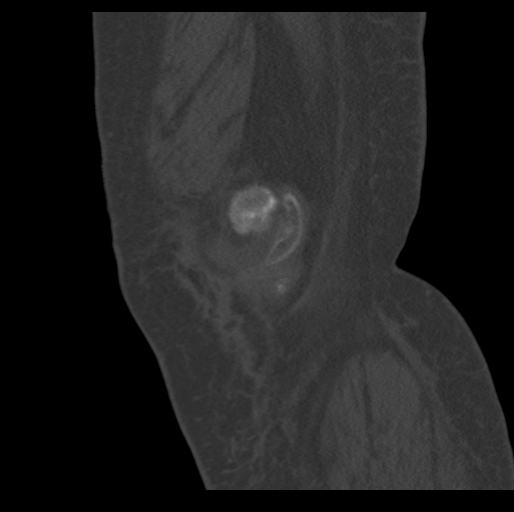

[16 of 33 positions shown; findings below may reference images not displayed]

FINDINGS: Tibial plateau fractures are demonstrated. There are fractures on
both sides of the tibial spines. The lateral plateau fracture
demonstrates minimal depression of 2 mm centrally.

The medial tibial plateau fracture begins near the medial spine and
runs longitudinally out through the medial metadiaphyseal cortex
with slight impaction but no significant displacement.

The fibula, femur and patella are intact.

Expected hemarthrosis.

The PCL is intact. Difficult to evaluate the ACL. The medial and
lateral collateral ligament complexes appear grossly intact by CT.
The quadriceps and patellar tendons are intact.
IMPRESSION: 1. Tibial plateau fractures as described above.
2. Expected hemarthrosis.
3. Difficult to evaluate the ACL. The PCL and the medial and lateral
collateral ligament complexes appear grossly intact by CT.
